# Patient Record
Sex: Female | Born: 1970 | Race: White | Hispanic: No | Smoking: Former smoker
Health system: Southern US, Community
[De-identification: ages and names within clinical notes are randomized; demographics above are authoritative.]

## PROBLEM LIST (undated history)

## (undated) DIAGNOSIS — F419 Anxiety disorder, unspecified: Secondary | ICD-10-CM

## (undated) DIAGNOSIS — R768 Other specified abnormal immunological findings in serum: Secondary | ICD-10-CM

## (undated) DIAGNOSIS — R7689 Other specified abnormal immunological findings in serum: Secondary | ICD-10-CM

## (undated) HISTORY — DX: Other specified abnormal immunological findings in serum: R76.89

## (undated) HISTORY — DX: Anxiety disorder, unspecified: F41.9

## (undated) HISTORY — PX: SHOULDER SURGERY: SHX246

## (undated) HISTORY — DX: Other specified abnormal immunological findings in serum: R76.8

## (undated) HISTORY — PX: NECK SURGERY: SHX720

---

## 2000-11-30 ENCOUNTER — Emergency Department (HOSPITAL_COMMUNITY): Admission: EM | Admit: 2000-11-30 | Discharge: 2000-11-30 | Payer: Self-pay

## 2018-07-19 ENCOUNTER — Emergency Department (HOSPITAL_COMMUNITY)
Admission: EM | Admit: 2018-07-19 | Discharge: 2018-07-19 | Disposition: A | Discharge status: Home / Self Care | Payer: Self-pay | Attending: Emergency Medicine | Admitting: Emergency Medicine

## 2018-07-19 ENCOUNTER — Encounter (HOSPITAL_COMMUNITY): Payer: Self-pay | Admitting: Emergency Medicine

## 2018-07-19 ENCOUNTER — Emergency Department (HOSPITAL_COMMUNITY): Payer: Self-pay

## 2018-07-19 DIAGNOSIS — F17210 Nicotine dependence, cigarettes, uncomplicated: Secondary | ICD-10-CM | POA: Insufficient documentation

## 2018-07-19 DIAGNOSIS — R1011 Right upper quadrant pain: Secondary | ICD-10-CM | POA: Insufficient documentation

## 2018-07-19 LAB — COMPREHENSIVE METABOLIC PANEL
ALT: 42 U/L (ref 0–44)
AST: 48 U/L — AB (ref 15–41)
Albumin: 3.9 g/dL (ref 3.5–5.0)
Alkaline Phosphatase: 44 U/L (ref 38–126)
Anion gap: 5 (ref 5–15)
BUN: 9 mg/dL (ref 6–20)
CHLORIDE: 105 mmol/L (ref 98–111)
CO2: 28 mmol/L (ref 22–32)
CREATININE: 0.74 mg/dL (ref 0.44–1.00)
Calcium: 9.4 mg/dL (ref 8.9–10.3)
GFR calc non Af Amer: >60 mL/min (ref >60)
Glucose, Bld: 101 mg/dL — ABNORMAL HIGH (ref 70–99)
POTASSIUM: 4 mmol/L (ref 3.5–5.1)
SODIUM: 138 mmol/L (ref 135–145)
Total Bilirubin: 0.4 mg/dL (ref 0.3–1.2)
Total Protein: 7.5 g/dL (ref 6.5–8.1)

## 2018-07-19 LAB — CBC
HEMATOCRIT: 43 % (ref 36.0–46.0)
HEMOGLOBIN: 14 g/dL (ref 12.0–15.0)
MCH: 33.1 pg (ref 26.0–34.0)
MCHC: 32.6 g/dL (ref 30.0–36.0)
MCV: 101.7 fL — AB (ref 80.0–100.0)
Platelets: 274 10*3/uL (ref 150–400)
RBC: 4.23 MIL/uL (ref 3.87–5.11)
RDW: 11.6 % (ref 11.5–15.5)
WBC: 6.4 10*3/uL (ref 4.0–10.5)
nRBC: 0 % (ref 0.0–0.2)

## 2018-07-19 LAB — LIPASE, BLOOD: LIPASE: 64 U/L — AB (ref 11–51)

## 2018-07-19 LAB — URINALYSIS, ROUTINE W REFLEX MICROSCOPIC
Bilirubin Urine: NEGATIVE
GLUCOSE, UA: NEGATIVE mg/dL
Hgb urine dipstick: NEGATIVE
Ketones, ur: NEGATIVE mg/dL
Leukocytes, UA: NEGATIVE
NITRITE: NEGATIVE
PH: 7 (ref 5.0–8.0)
Protein, ur: NEGATIVE mg/dL
SPECIFIC GRAVITY, URINE: 1.005 (ref 1.005–1.030)

## 2018-07-19 LAB — I-STAT BETA HCG BLOOD, ED (MC, WL, AP ONLY): I-stat hCG, quantitative: <5 m[IU]/mL (ref <5)

## 2018-07-19 MED ORDER — ONDANSETRON HCL 4 MG/2ML IJ SOLN
4.0000 mg | Freq: Once | INTRAMUSCULAR | Status: AC
  Administered 2018-07-19: 4 mg via INTRAVENOUS
  Filled 2018-07-19: qty 2

## 2018-07-19 MED ORDER — HYDROMORPHONE HCL 1 MG/ML IJ SOLN
1.0000 mg | Freq: Once | INTRAMUSCULAR | Status: AC
  Administered 2018-07-19: 1 mg via INTRAVENOUS
  Filled 2018-07-19: qty 1

## 2018-07-19 MED ORDER — SODIUM CHLORIDE 0.9 % IV BOLUS
1000.0000 mL | Freq: Once | INTRAVENOUS | Status: AC
  Administered 2018-07-19: 1000 mL via INTRAVENOUS

## 2018-07-19 MED ORDER — IOHEXOL 300 MG/ML  SOLN
100.0000 mL | Freq: Once | INTRAMUSCULAR | Status: AC | PRN
  Administered 2018-07-19: 100 mL via INTRAVENOUS

## 2018-07-19 NOTE — ED Triage Notes (Signed)
Pt to ER for evaluation of RUQ abdominal pain, tender to palpation, denies nausea but reports when it started she broke out in a sweat. Pt in NAD at this time. Reports significant stress over the last week.

## 2018-07-19 NOTE — ED Notes (Signed)
Results reviewed, no changes in acuity at this time 

## 2018-07-19 NOTE — ED Notes (Signed)
Patient transported to Ultrasound 

## 2018-07-19 NOTE — ED Provider Notes (Signed)
[facility] EMERGENCY DEPARTMENT Provider Note   CSN: [id number] Arrival date & time: 07/19/18  1432     History   Chief Complaint Chief Complaint  Patient presents with  . Abdominal Pain    HPI Kaitlin Carpenter is a 47 y.o. female.  47 y/o female with no PMH presents to the ED with a chief complaint of abdominal pain since this morning. Patient reports a sudden onset of stabbing constant RUQ pain with radiation to her back. She reports she has a previous history of 3 broken ribs on the left side from 20 years ago. Patient also reports a fever but denies taking her temperature, she also reports to be slightly nauseated.In addition patient reports she's had diarrhea for the past 3 days, reporting her stool as soft and in liquid nature with no blood. She denies any shortness breath, urinary symptoms or other complaints.      History reviewed. No pertinent past medical history.  There are no active problems to display for this patient.   History reviewed. No pertinent surgical history.   OB History   None      Home Medications    Prior to Admission medications   Not on File    Family History History reviewed. No pertinent family history.  Social History Social History   Tobacco Use  . Smoking status: Current Every Day Smoker  . Smokeless tobacco: Never Used  Substance Use Topics  . Alcohol use: Not on file  . Drug use: Not on file     Allergies   Patient has no allergy information on record.   Review of Systems Review of Systems  Constitutional: Positive for fever. Negative for chills.  HENT: Negative for sore throat.   Respiratory: Negative for shortness of breath.   Cardiovascular: Negative for chest pain.  Gastrointestinal: Positive for abdominal pain, diarrhea and nausea. Negative for vomiting.  Genitourinary: Negative for dysuria and flank pain.  Musculoskeletal: Negative for back pain.  Skin: Negative for pallor and wound.    Neurological: Negative for light-headedness and headaches.     Physical Exam Updated Vital Signs BP (!) 155/92   Pulse 74   Temp 98.2 F (36.8 C) (Oral)   Resp 15   SpO2 100%   Physical Exam  Constitutional: She is oriented to person, place, and time. She appears well-developed and well-nourished.  HENT:  Head: Normocephalic and atraumatic.  Cardiovascular: Normal rate.  Pulmonary/Chest: Effort normal and breath sounds normal.  Abdominal: Soft. Bowel sounds are normal. There is tenderness in the right upper quadrant. There is positive Murphy's sign. There is no rigidity, no CVA tenderness and no tenderness at McBurney's point.  Neurological: She is alert and oriented to person, place, and time.  Skin: Skin is warm and dry.  Nursing note and vitals reviewed.    ED Treatments / Results  Labs (all labs ordered are listed, but only abnormal results are displayed) Labs Reviewed  LIPASE, BLOOD - Abnormal; Notable for the following components:      Result Value   Lipase 64 (*)    All other components within normal limits  COMPREHENSIVE METABOLIC PANEL - Abnormal; Notable for the following components:   Glucose, Bld 101 (*)    AST 48 (*)    All other components within normal limits  CBC - Abnormal; Notable for the following components:   MCV 101.7 (*)    All other components within normal limits  URINALYSIS, ROUTINE W REFLEX MICROSCOPIC  I-STAT BETA HCG BLOOD, ED ([facility], [facility], [facility] ONLY)    EKG EKG Interpretation  Date/Time:  Saturday July 19 2018 14:48:37 EDT Ventricular Rate:  74 PR Interval:  134 QRS Duration: 84 QT Interval:  382 QTC Calculation: 424 R Axis:   82 Text Interpretation:  Normal sinus rhythm Normal ECG No previous tracing Confirmed by Gwyneth Sprout ([id number]) on 07/19/2018 6:59:52 PM   Radiology Ct Abdomen Pelvis W Contrast  Result Date: 07/19/2018 CLINICAL DATA:  Right upper quadrant abdominal pain and tenderness to palpation. EXAM: CT ABDOMEN  AND PELVIS WITH CONTRAST TECHNIQUE: Multidetector CT imaging of the abdomen and pelvis was performed using the standard protocol following bolus administration of intravenous contrast. CONTRAST:  OMNIPAQUE IOHEXOL 300 MG/ML  SOLN COMPARISON:  Same day right upper quadrant ultrasound. FINDINGS: Lower chest: Normal heart size.  Dependent bibasilar atelectasis. Hepatobiliary: No focal liver abnormality is seen. No gallstones, gallbladder wall thickening, or biliary dilatation. Pancreas: Unremarkable. No pancreatic ductal dilatation or surrounding inflammatory changes. Spleen: Normal in size without focal abnormality. Adrenals/Urinary Tract: Normal bilateral adrenal glands and kidneys. No nephrolithiasis, renal mass nor hydroureteronephrosis. Stomach/Bowel: Decompressed stomach. Normal duodenal sweep and ligament of Treitz position. Normal small bowel rotation. No bowel obstruction inflammation. The distal and terminal ileum are normal. The appendix is unremarkable and within normal limits for caliber. Moderate stool retention is seen within the colon without large bowel obstruction or inflammation. Vascular/Lymphatic: No significant vascular findings are present. No enlarged abdominal or pelvic lymph nodes. Reproductive: Tubular 5.3 x 1.8 cm fluid-filled structure in the right adnexa suspicious for hydrosalpinx, series 3/65. Physiologic sized follicles are otherwise noted within both ovaries. The uterus is anteverted in appearance without apparent mass. Other: No free air nor free fluid. Musculoskeletal: Degenerative disc disease L5-S1. No aggressive osseous lesions. IMPRESSION: 1. Tubular fluid-filled right adnexal structure suspicious for hydrosalpinx measuring up to 5.3 cm length. 2. Unremarkable appearance of the gallbladder without biliary dilatation, stones nor secondary signs of acute cholecystitis. 3. Moderate stool retention within the colon without bowel obstruction or inflammation. Electronically  Signed   By: Tollie Eth M.D.   On: 07/19/2018 19:52   US Abdomen Limited Ruq  Result Date: 07/19/2018 CLINICAL DATA:  Right upper quadrant pain EXAM: ULTRASOUND ABDOMEN LIMITED RIGHT UPPER QUADRANT COMPARISON:  None. FINDINGS: Gallbladder: No gallstones or wall thickening visualized. No sonographic Murphy sign noted by sonographer. Common bile duct: Diameter: Normal caliber, 3 mm Liver: No focal lesion identified. Within normal limits in parenchymal echogenicity. Portal vein is patent on color Doppler imaging with normal direction of blood flow towards the liver. IMPRESSION: Normal right upper quadrant ultrasound. Electronically Signed   By: Charlett Nose M.D.   On: 07/19/2018 18:06    Procedures Procedures (including critical care time)  Medications Ordered in ED Medications  sodium chloride 0.9 % bolus 1,000 mL (1,000 mLs Intravenous New Bag/Given 07/19/18 1818)  ondansetron (ZOFRAN) injection 4 mg (4 mg Intravenous Given 07/19/18 1818)  HYDROmorphone (DILAUDID) injection 1 mg (1 mg Intravenous Given 07/19/18 1819)  iohexol (OMNIPAQUE) 300 MG/ML solution 100 mL (100 mLs Intravenous Contrast Given 07/19/18 1924)     Initial Impression / Assessment and Plan / ED Course  I have reviewed the triage vital signs and the nursing notes.  Pertinent labs & imaging results that were available during my care of the patient were reviewed by me and considered in my medical decision making (see chart for details).    Presents with abdominal pain along the right upper  quadrant, it was of sudden onset this morning.  MP showed slight elevation of her AST at 48, lipase was elevated at 64.  A right upper quadrant ultrasound was obtained to rule out any stone pathology, US Abdomen showed no pathology.  Due to patient's pain obtain a CT abdomen and pelvis to evaluate any pathology in her gallbladder, liver, kidneys.  CT abdomen and pelvis was negative for any acute abnormality it showed: 1. Tubular  fluid-filled right adnexal structure suspicious for hydrosalpinx measuring up to 5.3 cm length. 2. Unremarkable appearance of the gallbladder without biliary dilatation, stones nor secondary signs of acute cholecystitis. 3. Moderate stool retention within the colon without bowel obstruction or inflammation.  Patient has received a bolus of fluid in the ED along with Dilaudid 1 mg for her pain.  Patient reports the pain has improved.  Patient also admitted to 1 of our RNs during her visit that she has been on a 6-day drinking binge as she is currently separating from her husband along with relocating.  Patient is originally from [place] and has not established care in [place].  At this time I have advised patient that she needs to establish care with a primary care physician to further evaluate her GI issues, there are no signs of infection, patient has been afebrile during ED visit.  Patient stable for discharge, vitals stable for discharge.  Return precautions provided.  Final Clinical Impressions(s) / ED Diagnoses   Final diagnoses:  RUQ pain  Right upper quadrant abdominal pain    ED Discharge Orders    None       Claude Manges, Cordelia Poche 07/19/18 2053    Gwyneth Sprout, MD 07/22/18 [phone]

## 2018-07-19 NOTE — Discharge Instructions (Signed)
All imaging today was normal.I have provided a referral for a gastroenterologist, please call to schedule an appointment at your earliest convenience. You may alternate ibuprofen and tylenol for your pain. Please return to the ED if you experience any fever, worsening symptoms, chest pain or shortness of breath.

## 2018-07-20 ENCOUNTER — Encounter (HOSPITAL_COMMUNITY): Payer: Self-pay

## 2018-07-20 ENCOUNTER — Emergency Department (HOSPITAL_COMMUNITY)
Admission: EM | Admit: 2018-07-20 | Discharge: 2018-07-20 | Disposition: A | Discharge status: Home / Self Care | Payer: Self-pay | Attending: Emergency Medicine | Admitting: Emergency Medicine

## 2018-07-20 ENCOUNTER — Other Ambulatory Visit: Payer: Self-pay

## 2018-07-20 DIAGNOSIS — F17210 Nicotine dependence, cigarettes, uncomplicated: Secondary | ICD-10-CM | POA: Insufficient documentation

## 2018-07-20 DIAGNOSIS — K279 Peptic ulcer, site unspecified, unspecified as acute or chronic, without hemorrhage or perforation: Secondary | ICD-10-CM

## 2018-07-20 DIAGNOSIS — K2900 Acute gastritis without bleeding: Secondary | ICD-10-CM

## 2018-07-20 LAB — COMPREHENSIVE METABOLIC PANEL
ALBUMIN: 3.7 g/dL (ref 3.5–5.0)
ALK PHOS: 38 U/L (ref 38–126)
ALT: 39 U/L (ref 0–44)
ANION GAP: 9 (ref 5–15)
AST: 47 U/L — ABNORMAL HIGH (ref 15–41)
BILIRUBIN TOTAL: 0.7 mg/dL (ref 0.3–1.2)
BUN: 10 mg/dL (ref 6–20)
CALCIUM: 9.1 mg/dL (ref 8.9–10.3)
CO2: 24 mmol/L (ref 22–32)
Chloride: 104 mmol/L (ref 98–111)
Creatinine, Ser: 0.66 mg/dL (ref 0.44–1.00)
GFR calc Af Amer: >60 mL/min (ref >60)
Glucose, Bld: 107 mg/dL — ABNORMAL HIGH (ref 70–99)
Potassium: 3.6 mmol/L (ref 3.5–5.1)
Sodium: 137 mmol/L (ref 135–145)
Total Protein: 7.1 g/dL (ref 6.5–8.1)

## 2018-07-20 LAB — CBC WITH DIFFERENTIAL/PLATELET
Abs Immature Granulocytes: 0.02 10*3/uL (ref 0.00–0.07)
BASOS ABS: 0 10*3/uL (ref 0.0–0.1)
Basophils Relative: 0 %
EOS PCT: 2 %
Eosinophils Absolute: 0.2 10*3/uL (ref 0.0–0.5)
HEMATOCRIT: 37.9 % (ref 36.0–46.0)
Hemoglobin: 12.8 g/dL (ref 12.0–15.0)
Immature Granulocytes: 0 %
LYMPHS ABS: 1.1 10*3/uL (ref 0.7–4.0)
Lymphocytes Relative: 13 %
MCH: 34 pg (ref 26.0–34.0)
MCHC: 33.8 g/dL (ref 30.0–36.0)
MCV: 100.5 fL — AB (ref 80.0–100.0)
MONO ABS: 0.5 10*3/uL (ref 0.1–1.0)
MONOS PCT: 6 %
Neutro Abs: 6.2 10*3/uL (ref 1.7–7.7)
Neutrophils Relative %: 79 %
Platelets: 235 10*3/uL (ref 150–400)
RBC: 3.77 MIL/uL — ABNORMAL LOW (ref 3.87–5.11)
RDW: 11.8 % (ref 11.5–15.5)
WBC: 8 10*3/uL (ref 4.0–10.5)
nRBC: 0 % (ref 0.0–0.2)

## 2018-07-20 LAB — LIPASE, BLOOD: LIPASE: 33 U/L (ref 11–51)

## 2018-07-20 MED ORDER — ONDANSETRON HCL 4 MG/2ML IJ SOLN
4.0000 mg | Freq: Once | INTRAMUSCULAR | Status: AC
  Administered 2018-07-20: 4 mg via INTRAVENOUS
  Filled 2018-07-20: qty 2

## 2018-07-20 MED ORDER — OMEPRAZOLE 20 MG PO CPDR: 20.0000 mg | DELAYED_RELEASE_CAPSULE | Freq: Every day | ORAL | 0 refills | Status: DC

## 2018-07-20 MED ORDER — SODIUM CHLORIDE 0.9 % IV BOLUS
1000.0000 mL | Freq: Once | INTRAVENOUS | Status: AC
  Administered 2018-07-20: 1000 mL via INTRAVENOUS

## 2018-07-20 MED ORDER — ONDANSETRON 4 MG PO TBDP: 4.0000 mg | ORAL_TABLET | Freq: Three times a day (TID) | ORAL | 0 refills | Status: DC | PRN

## 2018-07-20 MED ORDER — SUCRALFATE 1 G PO TABS: 1.0000 g | ORAL_TABLET | Freq: Three times a day (TID) | ORAL | 0 refills | Status: DC

## 2018-07-20 MED ORDER — MORPHINE SULFATE (PF) 4 MG/ML IV SOLN
5.0000 mg | Freq: Once | INTRAVENOUS | Status: AC
  Administered 2018-07-20: 5 mg via INTRAVENOUS
  Filled 2018-07-20: qty 2

## 2018-07-20 MED ORDER — HYDROCODONE-ACETAMINOPHEN 5-325 MG PO TABS: 1.0000 | ORAL_TABLET | Freq: Four times a day (QID) | ORAL | 0 refills | Status: DC | PRN

## 2018-07-20 MED ORDER — FAMOTIDINE IN NACL 20-0.9 MG/50ML-% IV SOLN
20.0000 mg | Freq: Once | INTRAVENOUS | Status: AC
  Administered 2018-07-20: 20 mg via INTRAVENOUS
  Filled 2018-07-20: qty 50

## 2018-07-20 MED ORDER — GI COCKTAIL ~~LOC~~
30.0000 mL | Freq: Once | ORAL | Status: AC
  Administered 2018-07-20: 30 mL via ORAL
  Filled 2018-07-20: qty 30

## 2018-07-20 MED ORDER — KETOROLAC TROMETHAMINE 15 MG/ML IJ SOLN: 15.0000 mg | Freq: Once | INTRAMUSCULAR | Status: DC

## 2018-07-20 NOTE — ED Notes (Signed)
ED Provider at bedside. 

## 2018-07-20 NOTE — ED Notes (Signed)
Patient has been instructed and advised to limit or completley avoid consuming ETOH and to temporarily stop taking Ibprofuen, and if patient needs to take Ibprofuen that patient needs to consume food with medication.  Shortly after patient received medication, the patient had made several rude comments over phone about RN and needing to be discharged.

## 2018-07-20 NOTE — Discharge Instructions (Addendum)
STOP taking Ibuprofen, Aspirin, Alleve, or other NSAID medications  START taking the medications as prescribed. Take the Omeprazole first thing in the morning, 30 minutes before eating.  Eat frequent, small meals  Decrease your alcohol intake  Avoid spicy foods

## 2018-07-20 NOTE — ED Triage Notes (Signed)
Pt comes from home. Pt is Ambulatory and AOx4. Pt brought in via [facility]. Pt called 911 due to abdominal pain in upper right quadrant that was sharp and sudden at 1000 that began yesterday, was seen at [facility]. Pt arrives today with same complaint with pain that began again yesterday at 1700 and patient stated she passed out.   Patient received of Fentanyl IV in route to [facility].

## 2018-07-20 NOTE — ED Provider Notes (Signed)
[facility]-EMERGENCY DEPT Provider Note   CSN: [id number] Arrival date & time: 07/20/18  0732     History   Chief Complaint Chief Complaint  Patient presents with  . Abdominal Pain    HPI Kaitlin Carpenter is a 47 y.o. female.  HPI 47 year old female here with epigastric and right upper quadrant pain.  The patient was just seen yesterday for similar complaints.  She states that over the last several days, she has had an aching, gnawing, superior, epigastric and right upper quadrant pain.  She admits to recent increase in alcohol use as well as ibuprofen and NSAID use due to stressors in her life.  She was seen in the ED yesterday and had negative CT scan.  She had an incidental hydrosalpinx but states this is been noted previously and she has no vaginal bleeding or discharge.  The pain is localized primarily in the right upper quadrant and epigastric area.  It seems to be worse in the mornings.  It has some occasional relief with eating, though it worsens when eating spicy foods.  Denies any alleviating factors.  She is not on any antacids.  She continues to drink regularly.  She is had poor appetite and some night sweats recently.  Denies known history of ulcers.  No blood in her stools, though she intermittently notes dark stools.  Denies any blood in her emesis.  The emesis is light green/yellow with no dark green or bile.  History reviewed. No pertinent past medical history.  There are no active problems to display for this patient.   History reviewed. No pertinent surgical history.   OB History   None      Home Medications    Prior to Admission medications   Medication Sig Start Date End Date Taking? Authorizing Provider  HYDROcodone-acetaminophen (NORCO/VICODIN) 5-325 MG tablet Take 1 tablet by mouth every 6 (six) hours as needed for severe pain. 07/20/18   Shaune Pollack, MD  omeprazole (PRILOSEC) 20 MG capsule Take 1 capsule (20 mg total) by mouth  daily for 14 days. 07/20/18 08/03/18  Shaune Pollack, MD  ondansetron (ZOFRAN ODT) 4 MG disintegrating tablet Take 1 tablet (4 mg total) by mouth every 8 (eight) hours as needed for nausea or vomiting. 07/20/18   Shaune Pollack, MD  sucralfate (CARAFATE) 1 g tablet Take 1 tablet (1 g total) by mouth 4 (four) times daily -  with meals and at bedtime for 10 days. 07/20/18 07/30/18  Shaune Pollack, MD    Family History No family history on file.  Social History Social History   Tobacco Use  . Smoking status: Current Every Day Smoker    Packs/day: 1.00    Types: Cigarettes  . Smokeless tobacco: Never Used  Substance Use Topics  . Alcohol use: Yes    Alcohol/week: 6.0 standard drinks    Types: 6 Cans of beer per week    Comment: Pt drinks 6 pack roughly during work week and drinks more on weekend  . Drug use: Never     Allergies   Patient has no known allergies.   Review of Systems Review of Systems  Constitutional: Positive for fatigue. Negative for chills and fever.  HENT: Negative for congestion and rhinorrhea.   Eyes: Negative for visual disturbance.  Respiratory: Negative for cough, shortness of breath and wheezing.   Cardiovascular: Negative for chest pain and leg swelling.  Gastrointestinal: Positive for abdominal pain, nausea and vomiting. Negative for diarrhea.  Genitourinary: Negative for  dysuria and flank pain.  Musculoskeletal: Negative for neck pain and neck stiffness.  Skin: Negative for rash and wound.  Allergic/Immunologic: Negative for immunocompromised state.  Neurological: Negative for syncope, weakness and headaches.  All other systems reviewed and are negative.    Physical Exam Updated Vital Signs BP (!) 142/88   Pulse 80   Temp 97.6 F (36.4 C) (Oral)   Resp 10   Ht 5\' 4"  (1.626 m)   Wt 55.3 kg   LMP 06/27/2018 (Within Days)   SpO2 99%   BMI 20.94 kg/m   Physical Exam  Constitutional: She is oriented to person, place, and time. She  appears well-developed and well-nourished. No distress.  HENT:  Head: Normocephalic and atraumatic.  Eyes: Conjunctivae are normal.  Neck: Neck supple.  Cardiovascular: Normal rate, regular rhythm and normal heart sounds. Exam reveals no friction rub.  No murmur heard. Pulmonary/Chest: Effort normal and breath sounds normal. No respiratory distress. She has no wheezes. She has no rales.  Abdominal: She exhibits no distension. There is tenderness in the right upper quadrant and epigastric area. There is no rigidity, no rebound and no guarding.  Musculoskeletal: She exhibits no edema.  Neurological: She is alert and oriented to person, place, and time. She exhibits normal muscle tone.  Skin: Skin is warm. Capillary refill takes less than 2 seconds.  Psychiatric: She has a normal mood and affect.  Nursing note and vitals reviewed.    ED Treatments / Results  Labs (all labs ordered are listed, but only abnormal results are displayed) Labs Reviewed  CBC WITH DIFFERENTIAL/PLATELET - Abnormal; Notable for the following components:      Result Value   RBC 3.77 (*)    MCV 100.5 (*)    All other components within normal limits  COMPREHENSIVE METABOLIC PANEL - Abnormal; Notable for the following components:   Glucose, Bld 107 (*)    AST 47 (*)    All other components within normal limits  LIPASE, BLOOD    EKG EKG Interpretation  Date/Time:  Sunday July 20 2018 08:50:22 EDT Ventricular Rate:  81 PR Interval:    QRS Duration: 89 QT Interval:  400 QTC Calculation: 465 R Axis:   79 Text Interpretation:  Sinus rhythm No significant change since last tracing Confirmed by Shaune Pollack [phone]) on 07/20/2018 9:05:20 AM   Radiology Ct Abdomen Pelvis W Contrast  Result Date: 07/19/2018 CLINICAL DATA:  Right upper quadrant abdominal pain and tenderness to palpation. EXAM: CT ABDOMEN AND PELVIS WITH CONTRAST TECHNIQUE: Multidetector CT imaging of the abdomen and pelvis was performed  using the standard protocol following bolus administration of intravenous contrast. CONTRAST:  OMNIPAQUE IOHEXOL 300 MG/ML  SOLN COMPARISON:  Same day right upper quadrant ultrasound. FINDINGS: Lower chest: Normal heart size.  Dependent bibasilar atelectasis. Hepatobiliary: No focal liver abnormality is seen. No gallstones, gallbladder wall thickening, or biliary dilatation. Pancreas: Unremarkable. No pancreatic ductal dilatation or surrounding inflammatory changes. Spleen: Normal in size without focal abnormality. Adrenals/Urinary Tract: Normal bilateral adrenal glands and kidneys. No nephrolithiasis, renal mass nor hydroureteronephrosis. Stomach/Bowel: Decompressed stomach. Normal duodenal sweep and ligament of Treitz position. Normal small bowel rotation. No bowel obstruction inflammation. The distal and terminal ileum are normal. The appendix is unremarkable and within normal limits for caliber. Moderate stool retention is seen within the colon without large bowel obstruction or inflammation. Vascular/Lymphatic: No significant vascular findings are present. No enlarged abdominal or pelvic lymph nodes. Reproductive: Tubular 5.3 x 1.8 cm fluid-filled structure in  the right adnexa suspicious for hydrosalpinx, series 3/65. Physiologic sized follicles are otherwise noted within both ovaries. The uterus is anteverted in appearance without apparent mass. Other: No free air nor free fluid. Musculoskeletal: Degenerative disc disease L5-S1. No aggressive osseous lesions. IMPRESSION: 1. Tubular fluid-filled right adnexal structure suspicious for hydrosalpinx measuring up to 5.3 cm length. 2. Unremarkable appearance of the gallbladder without biliary dilatation, stones nor secondary signs of acute cholecystitis. 3. Moderate stool retention within the colon without bowel obstruction or inflammation. Electronically Signed   By: Tollie Eth M.D.   On: 07/19/2018 19:52   US Abdomen Limited Ruq  Result Date:  07/19/2018 CLINICAL DATA:  Right upper quadrant pain EXAM: ULTRASOUND ABDOMEN LIMITED RIGHT UPPER QUADRANT COMPARISON:  None. FINDINGS: Gallbladder: No gallstones or wall thickening visualized. No sonographic Murphy sign noted by sonographer. Common bile duct: Diameter: Normal caliber, 3 mm Liver: No focal lesion identified. Within normal limits in parenchymal echogenicity. Portal vein is patent on color Doppler imaging with normal direction of blood flow towards the liver. IMPRESSION: Normal right upper quadrant ultrasound. Electronically Signed   By: Charlett Nose M.D.   On: 07/19/2018 18:06    Procedures Procedures (including critical care time)  Medications Ordered in ED Medications  sodium chloride 0.9 % bolus 1,000 mL (0 mLs Intravenous Stopped 07/20/18 0923)  gi cocktail (Maalox,Lidocaine,Donnatal) (30 mLs Oral Given 07/20/18 0817)  famotidine (PEPCID) IVPB 20 mg premix (0 mg Intravenous Stopped 07/20/18 0923)  ondansetron (ZOFRAN) injection 4 mg (4 mg Intravenous Given 07/20/18 0818)  morphine 4 MG/ML injection 5 mg (5 mg Intravenous Given 07/20/18 0929)     Initial Impression / Assessment and Plan / ED Course  I have reviewed the triage vital signs and the nursing notes.  Pertinent labs & imaging results that were available during my care of the patient were reviewed by me and considered in my medical decision making (see chart for details).     47 yo F here with RUQ/epigastric pain. Records reviewed from recent ED visit - labs reassuring, lipase/AST minimally elevated, CT neg for acute abnormality. History, exam is most c/w likely PUD/gastritis exacerbated by regular EtOH use, NSAIDs, and increased stressors. She is HDS, no blood in emesis though she does note some intermittent dark stool - will check CBC, labs, tx as such. No gallstones on recent RUQ U/S. No cough, referred pain from pulm source unlikely. She does have incidental hydrosalpinx but this has been noted in past per pt,  no urinary/vaginal sx, doubt pain from GU source.  Sx markedly improved with GI cocktail. Tolerating PO without difficulty. Hgb stable (slight decrease likely 2/2 fluids given yesterday). No BRBPR or hematemesis. Lipase, AST at baseline. Suspect ongoing gastritis/GERD. Will start on antacids, given analgesics other than NSAIDs and refer to GI. Return precautions given.  Final Clinical Impressions(s) / ED Diagnoses   Final diagnoses:  Acute superficial gastritis without hemorrhage  PUD (peptic ulcer disease)    ED Discharge Orders         Ordered    omeprazole (PRILOSEC) 20 MG capsule  Daily     07/20/18 0953    sucralfate (CARAFATE) 1 g tablet  3 times daily with meals & bedtime     07/20/18 0953    HYDROcodone-acetaminophen (NORCO/VICODIN) 5-325 MG tablet  Every 6 hours PRN     07/20/18 0953    ondansetron (ZOFRAN ODT) 4 MG disintegrating tablet  Every 8 hours PRN     07/20/18 0954  Shaune Pollack, MD 07/20/18 [phone]

## 2018-10-02 ENCOUNTER — Ambulatory Visit: Payer: Self-pay

## 2019-05-20 ENCOUNTER — Emergency Department (HOSPITAL_COMMUNITY)
Admission: EM | Admit: 2019-05-20 | Discharge: 2019-05-20 | Disposition: A | Discharge status: Home / Self Care | Payer: Self-pay | Attending: Emergency Medicine | Admitting: Emergency Medicine

## 2019-05-20 ENCOUNTER — Emergency Department (HOSPITAL_COMMUNITY): Payer: Self-pay

## 2019-05-20 ENCOUNTER — Other Ambulatory Visit: Payer: Self-pay

## 2019-05-20 ENCOUNTER — Encounter (HOSPITAL_COMMUNITY): Payer: Self-pay | Admitting: Emergency Medicine

## 2019-05-20 DIAGNOSIS — F410 Panic disorder [episodic paroxysmal anxiety] without agoraphobia: Secondary | ICD-10-CM | POA: Insufficient documentation

## 2019-05-20 DIAGNOSIS — R05 Cough: Secondary | ICD-10-CM | POA: Insufficient documentation

## 2019-05-20 DIAGNOSIS — F17210 Nicotine dependence, cigarettes, uncomplicated: Secondary | ICD-10-CM | POA: Insufficient documentation

## 2019-05-20 DIAGNOSIS — R197 Diarrhea, unspecified: Secondary | ICD-10-CM | POA: Insufficient documentation

## 2019-05-20 LAB — CBC WITH DIFFERENTIAL/PLATELET
Abs Immature Granulocytes: 0 10*3/uL (ref 0.00–0.07)
Basophils Absolute: 0 10*3/uL (ref 0.0–0.1)
Basophils Relative: 1 %
Eosinophils Absolute: 0 10*3/uL (ref 0.0–0.5)
Eosinophils Relative: 1 %
HCT: 38.1 % (ref 36.0–46.0)
Hemoglobin: 13.5 g/dL (ref 12.0–15.0)
Immature Granulocytes: 0 %
Lymphocytes Relative: 26 %
Lymphs Abs: 1 10*3/uL (ref 0.7–4.0)
MCH: 36.1 pg — ABNORMAL HIGH (ref 26.0–34.0)
MCHC: 35.4 g/dL (ref 30.0–36.0)
MCV: 101.9 fL — ABNORMAL HIGH (ref 80.0–100.0)
Monocytes Absolute: 0.5 10*3/uL (ref 0.1–1.0)
Monocytes Relative: 14 %
Neutro Abs: 2.3 10*3/uL (ref 1.7–7.7)
Neutrophils Relative %: 58 %
Platelets: 152 10*3/uL (ref 150–400)
RBC: 3.74 MIL/uL — ABNORMAL LOW (ref 3.87–5.11)
RDW: 12.1 % (ref 11.5–15.5)
WBC: 3.9 10*3/uL — ABNORMAL LOW (ref 4.0–10.5)
nRBC: 0 % (ref 0.0–0.2)

## 2019-05-20 LAB — BASIC METABOLIC PANEL
Anion gap: 13 (ref 5–15)
BUN: 5 mg/dL — ABNORMAL LOW (ref 6–20)
CO2: 24 mmol/L (ref 22–32)
Calcium: 8.9 mg/dL (ref 8.9–10.3)
Chloride: 99 mmol/L (ref 98–111)
Creatinine, Ser: 0.7 mg/dL (ref 0.44–1.00)
GFR calc Af Amer: >60 mL/min (ref >60)
GFR calc non Af Amer: >60 mL/min (ref >60)
Glucose, Bld: 105 mg/dL — ABNORMAL HIGH (ref 70–99)
Potassium: 3.5 mmol/L (ref 3.5–5.1)
Sodium: 136 mmol/L (ref 135–145)

## 2019-05-20 LAB — TSH: TSH: 1.701 u[IU]/mL (ref 0.350–4.500)

## 2019-05-20 LAB — TROPONIN I (HIGH SENSITIVITY): Troponin I (High Sensitivity): 15 ng/L (ref <18)

## 2019-05-20 MED ORDER — LORAZEPAM 2 MG/ML IJ SOLN
2.0000 mg | Freq: Once | INTRAMUSCULAR | Status: AC
  Administered 2019-05-20: 15:00:00 2 mg via INTRAVENOUS
  Filled 2019-05-20: qty 1

## 2019-05-20 MED ORDER — HYDROXYZINE HCL 25 MG PO TABS: 25.0000 mg | ORAL_TABLET | Freq: Four times a day (QID) | ORAL | 0 refills | Status: DC | PRN

## 2019-05-20 NOTE — ED Triage Notes (Signed)
Pt arrives via EMS from home with reports of dizziness and vision trouble. Hx of anxiety but not on medications. Pt endorses stress.

## 2019-05-20 NOTE — ED Notes (Signed)
Pt verbalized understanding of discharge paperwork, prescriptions and follow-up care 

## 2019-05-20 NOTE — ED Provider Notes (Signed)
[facility] EMERGENCY DEPARTMENT Provider Note   CSN: [id number] Arrival date & time: 05/20/19  1442    History   Chief Complaint Chief Complaint  Patient presents with  . Panic Attack    HPI Kaitlin Carpenter is a 48 y.o. female history of anxiety presents to the ED complaining of anxiety, palpitations, chest tightness, dizziness, and blurred vision.  Patient reports her symptoms acutely worsened this afternoon and she became very dizzy, described as the room spinning, and states she felt like she was in a pass out so she called EMS.  She reports she has been having palpitations and chest tightness intermittently over several months when she feels anxious.  Patient reports that the tightness is located in the center of her chest.  She also endorses diaphoresis and tremors during these times.  She is also reporting some transient blurred vision earlier today when she was feeling dizzy and states she is supposed to wear glasses.  Patient does endorse a cough since yesterday and a couple episodes of diarrhea earlier today.  Patient reports she smokes 1 pack of cigarettes daily and drinks 4-5 beers daily but denies any illicit drug use.  Patient reports she is going through several life stressors including being laid off and facing possible eviction from her apartment and states she has been very anxious recently.  She denies any recent fever, shortness of breath, abdominal pain, nausea, vomiting, or any other complaints.     The history is provided by the patient.    History reviewed. No pertinent past medical history.  There are no active problems to display for this patient.   History reviewed. No pertinent surgical history.   OB History   No obstetric history on file.      Home Medications    Prior to Admission medications   Medication Sig Start Date End Date Taking? Authorizing Provider  Aspirin-Salicylamide-Caffeine (BC HEADACHE POWDER PO) Take 1 packet by  mouth as needed (headaches).   Yes [provider]  HYDROcodone-acetaminophen (NORCO/VICODIN) 5-325 MG tablet Take 1 tablet by mouth every 6 (six) hours as needed for severe pain. Patient not taking: Reported on 05/20/2019 07/20/18   Duffy Bruce, MD  hydrOXYzine (ATARAX/VISTARIL) 25 MG tablet Take 1 tablet (25 mg total) by mouth every 6 (six) hours as needed for anxiety. 05/20/19   Candie Chroman, MD  omeprazole (PRILOSEC) 20 MG capsule Take 1 capsule (20 mg total) by mouth daily for 14 days. Patient not taking: Reported on 05/20/2019 07/20/18 08/03/18  Duffy Bruce, MD  ondansetron (ZOFRAN ODT) 4 MG disintegrating tablet Take 1 tablet (4 mg total) by mouth every 8 (eight) hours as needed for nausea or vomiting. Patient not taking: Reported on 05/20/2019 07/20/18   Duffy Bruce, MD  sucralfate (CARAFATE) 1 g tablet Take 1 tablet (1 g total) by mouth 4 (four) times daily -  with meals and at bedtime for 10 days. Patient not taking: Reported on 05/20/2019 07/20/18 07/30/18  Duffy Bruce, MD    Family History No family history on file.  Social History Social History   Tobacco Use  . Smoking status: Current Every Day Smoker    Packs/day: 1.00    Types: Cigarettes  . Smokeless tobacco: Never Used  Substance Use Topics  . Alcohol use: Yes    Alcohol/week: 6.0 standard drinks    Types: 6 Cans of beer per week    Comment: Pt drinks 6 pack roughly during work week and drinks more on  weekend  . Drug use: Never     Allergies   Patient has no known allergies.   Review of Systems Review of Systems  Constitutional: Positive for diaphoresis. Negative for chills and fever.  HENT: Negative for ear pain and sore throat.   Eyes: Positive for visual disturbance. Negative for pain.  Respiratory: Positive for cough. Negative for shortness of breath.   Cardiovascular: Positive for chest pain and palpitations.  Gastrointestinal: Positive for diarrhea. Negative for abdominal pain,  nausea and vomiting.  Genitourinary: Negative for dysuria and hematuria.  Musculoskeletal: Negative for arthralgias and back pain.  Skin: Negative for color change and rash.  Neurological: Positive for dizziness and tremors. Negative for seizures, syncope, facial asymmetry, speech difficulty, weakness, numbness and headaches.  Psychiatric/Behavioral: Negative for behavioral problems. The patient is nervous/anxious.   All other systems reviewed and are negative.    Physical Exam Updated Vital Signs BP 134/78   Pulse 81   Temp 99.4 F (37.4 C) (Oral)   Resp 19   SpO2 96%   Physical Exam Vitals signs and nursing note reviewed.  Constitutional:      General: She is not in acute distress.    Appearance: Normal appearance. She is well-developed. She is not ill-appearing, toxic-appearing or diaphoretic.  HENT:     Head: Normocephalic and atraumatic.     Nose: Nose normal. No congestion or rhinorrhea.     Mouth/Throat:     Mouth: Mucous membranes are moist.     Pharynx: Oropharynx is clear. No oropharyngeal exudate or posterior oropharyngeal erythema.  Eyes:     Extraocular Movements: Extraocular movements intact.     Conjunctiva/sclera: Conjunctivae normal.     Pupils: Pupils are equal, round, and reactive to light.  Neck:     Musculoskeletal: Normal range of motion and neck supple. No neck rigidity or muscular tenderness.  Cardiovascular:     Rate and Rhythm: Normal rate and regular rhythm.     Pulses: Normal pulses.     Heart sounds: Normal heart sounds. No murmur. No friction rub. No gallop.   Pulmonary:     Effort: Pulmonary effort is normal. No respiratory distress.     Breath sounds: Normal breath sounds. No stridor. No wheezing, rhonchi or rales.  Chest:     Chest wall: No tenderness.  Abdominal:     General: Abdomen is flat. There is no distension.     Palpations: Abdomen is soft.     Tenderness: There is no abdominal tenderness. There is no guarding or rebound.   Musculoskeletal: Normal range of motion.        General: No swelling, tenderness, deformity or signs of injury.  Skin:    General: Skin is warm and dry.  Neurological:     General: No focal deficit present.     Mental Status: She is alert and oriented to person, place, and time. Mental status is at baseline.     Cranial Nerves: No cranial nerve deficit.     Sensory: No sensory deficit.     Motor: No weakness.  Psychiatric:        Mood and Affect: Mood is anxious.        Behavior: Behavior normal.      ED Treatments / Results  Labs (all labs ordered are listed, but only abnormal results are displayed) Labs Reviewed  BASIC METABOLIC PANEL - Abnormal; Notable for the following components:      Result Value   Glucose, Bld 105 (*)  BUN 5 (*)    All other components within normal limits  CBC WITH DIFFERENTIAL/PLATELET - Abnormal; Notable for the following components:   WBC 3.9 (*)    RBC 3.74 (*)    MCV 101.9 (*)    MCH 36.1 (*)    All other components within normal limits  TSH  TROPONIN I (HIGH SENSITIVITY)    EKG EKG Interpretation  Date/Time:  Wednesday May 20 2019 14:47:32 EDT Ventricular Rate:  96 PR Interval:    QRS Duration: 92 QT Interval:  357 QTC Calculation: 452 R Axis:   81 Text Interpretation:  Sinus rhythm No significant change since last tracing Confirmed by Jacalyn LefevreHaviland, Julie [phone]([id number]) on 05/20/2019 3:42:11 PM   Radiology Dg Chest Portable 1 View  Result Date: 05/20/2019 CLINICAL DATA:  Chest pain EXAM: PORTABLE CHEST 1 VIEW COMPARISON:  None. FINDINGS: The heart size and mediastinal contours are within normal limits. Both lungs are clear. The visualized skeletal structures are unremarkable. IMPRESSION: No acute cardiopulmonary process. Electronically Signed   By: Jonna ClarkBindu  Avutu M.D.   On: 05/20/2019 15:31    Procedures Procedures (including critical care time)  Medications Ordered in ED Medications  LORazepam (ATIVAN) injection 2 mg (2 mg  Intravenous Given 05/20/19 1522)     Initial Impression / Assessment and Plan / ED Course  I have reviewed the triage vital signs and the nursing notes.  Pertinent labs & imaging results that were available during my care of the patient were reviewed by me and considered in my medical decision making (see chart for details).        Germain OsgoodJennifer E Bothun is a 48 y.o. female history of anxiety presents to the ED complaining of anxiety, palpitations, chest tightness, dizziness, diaphoresis, and blurred vision.  On exam, patient appears anxious but has no focal findings.  Patient was given 2 mg of IV Ativan for anxiety with improvement of her symptoms.  EKG shows normal sinus rhythm at a rate of 96, normal axis, normal intervals, and no acute ischemic changes.  BMP and CBC were unremarkable.  High-sensitivity troponin was negative at 15.  TSH within normal limits at 1.7.  Presentation consistent with anxiety attack.  Patient was given prescription for PRN Vistaril.  [facility]  Patient was discharged home in stable condition.  Final Clinical Impressions(s) / ED Diagnoses   Final diagnoses:  Anxiety attack    ED Discharge Orders         Ordered    hydrOXYzine (ATARAX/VISTARIL) 25 MG tablet  Every 6 hours PRN     05/20/19 1624           Garry HeaterEames, Basir Niven, MD 05/20/19 1629    Jacalyn LefevreHaviland, Julie, MD 05/20/19 [phone]

## 2020-04-06 ENCOUNTER — Telehealth (INDEPENDENT_AMBULATORY_CARE_PROVIDER_SITE_OTHER): Payer: Self-pay | Admitting: Primary Care

## 2020-06-24 ENCOUNTER — Ambulatory Visit: Payer: Self-pay

## 2020-06-24 ENCOUNTER — Other Ambulatory Visit: Payer: Self-pay

## 2020-06-24 ENCOUNTER — Encounter (HOSPITAL_COMMUNITY): Payer: Self-pay

## 2020-06-24 ENCOUNTER — Ambulatory Visit (HOSPITAL_COMMUNITY)
Admission: EM | Admit: 2020-06-24 | Discharge: 2020-06-24 | Disposition: A | Discharge status: Home / Self Care | Payer: Medicaid - Out of State | Attending: Internal Medicine | Admitting: Internal Medicine

## 2020-06-24 DIAGNOSIS — J069 Acute upper respiratory infection, unspecified: Secondary | ICD-10-CM | POA: Diagnosis present

## 2020-06-24 DIAGNOSIS — F17210 Nicotine dependence, cigarettes, uncomplicated: Secondary | ICD-10-CM | POA: Diagnosis not present

## 2020-06-24 DIAGNOSIS — R9431 Abnormal electrocardiogram [ECG] [EKG]: Secondary | ICD-10-CM | POA: Insufficient documentation

## 2020-06-24 DIAGNOSIS — Z20822 Contact with and (suspected) exposure to covid-19: Secondary | ICD-10-CM | POA: Diagnosis not present

## 2020-06-24 NOTE — Telephone Encounter (Signed)
Patient called back and we finished triage. Per protocol and chest pain  That goes to back with nausea dizziness vomiting and SOB she will seek care in ER tonight. Care advice was read to patient.  She verbalized understanding  Reason for Disposition . [1] Chest pain (or "angina") comes and goes AND [2] is happening more often (increasing in frequency) or getting worse (increasing in severity) (Exception: chest pains that last only a few seconds)  Answer Assessment - Initial Assessment Questions 1. LOCATION: "Where does it hurt?"       Left chest now all over 2. RADIATION: "Does the pain go anywhere else?" (e.g., into neck, jaw, arms, back)    Back lower 3. ONSET: "When did the chest pain begin?" (Minutes, hours or days)     5 days 4. PATTERN "Does the pain come and go, or has it been constant since it started?"  "Does it get worse with exertion?"     Comes and goes 5. DURATION: "How long does it last" (e.g., seconds, minutes, hours)     Few seconds 6. SEVERITY: "How bad is the pain?"  (e.g., Scale 1-10; mild, moderate, or severe)    - MILD (1-3): doesn't interfere with normal activities     - MODERATE (4-7): interferes with normal activities or awakens from sleep    - SEVERE (8-10): excruciating pain, unable to do any normal activities       Severe keep her up 7. CARDIAC RISK FACTORS: "Do you have any history of heart problems or risk factors for heart disease?" (e.g., angina, prior heart attack; diabetes, high blood pressure, high cholesterol, smoker, or strong family history of heart disease)     Hypertension possible. Smoker,unsure of history of Heart dx 8. PULMONARY RISK FACTORS: "Do you have any history of lung disease?"  (e.g., blood clots in lung, asthma, emphysema, birth control pills)    none 9. CAUSE: "What do you think is causing the chest pain?"   Unsure could be viral 10. OTHER SYMPTOMS: "Do you have any other symptoms?" (e.g., dizziness, nausea, vomiting, sweating, fever,  difficulty breathing, cough)       Sweats, dizzy sweats nausea vomiting SOB ocasoinal cough 11. PREGNANCY: "Is there any chance you are pregnant?" "When was your last menstrual period?"      N/A  Protocols used: CHEST PAIN-A-AH

## 2020-06-24 NOTE — Telephone Encounter (Signed)
Patient called with C/O chest pain and back pain. She states that she has been exposed to her brother who has had COVID-19 symptoms but never tested. She has been fully vaccinated. She states chest pain comes and goes She feels sweaty at times. She is a smoker. Call was dropped. I attempted to reconnect with patient but each number went to voice mail. Last message was for her to call us back or seek medical care at ER. I was unable to reconnect with her but left nurse line number for her to return call or seek medical care Triage was incomplete  Answer Assessment - Initial Assessment Questions 1. LOCATION: "Where does it hurt?"       Left chest initially 2. RADIATION: "Does the pain go anywhere else?" (e.g., into neck, jaw, arms, back)     lower ribs numbness to fingers at time 3. ONSET: "When did the chest pain begin?" (Minutes, hours or days)      About 5 days ago 4. PATTERN "Does the pain come and go, or has it been constant since it started?"  "Does it get worse with exertion?"     Comes and goes 5. DURATION: "How long does it last" (e.g., seconds, minutes, hours)      6. SEVERITY: "How bad is the pain?"  (e.g., Scale 1-10; mild, moderate, or severe)    - MILD (1-3): doesn't interfere with normal activities     - MODERATE (4-7): interferes with normal activities or awakens from sleep    - SEVERE (8-10): excruciating pain, unable to do any normal activities       *No Answer* 7. CARDIAC RISK FACTORS: "Do you have any history of heart problems or risk factors for heart disease?" (e.g., angina, prior heart attack; diabetes, high blood pressure, high cholesterol, smoker, or strong family history of heart disease)     *No Answer* 8. PULMONARY RISK FACTORS: "Do you have any history of lung disease?"  (e.g., blood clots in lung, asthma, emphysema, birth control pills)     *No Answer* 9. CAUSE: "What do you think is causing the chest pain?"     *No Answer* 10. OTHER SYMPTOMS: "Do you have any  other symptoms?" (e.g., dizziness, nausea, vomiting, sweating, fever, difficulty breathing, cough)       *No Answer* 11. PREGNANCY: "Is there any chance you are pregnant?" "When was your last menstrual period?"       *No Answer*  Protocols used: CHEST PAIN-A-AH

## 2020-06-24 NOTE — Discharge Instructions (Signed)
Your covid test is pending.  Take ibuprofen or tylenol for symptoms.  Go to the Emergency department if symptoms worsen or change.

## 2020-06-25 LAB — SARS CORONAVIRUS 2 (TAT 6-24 HRS): SARS Coronavirus 2: NEGATIVE

## 2020-06-25 NOTE — ED Provider Notes (Signed)
[facility]    CSN: [id number] Arrival date & time: 06/24/20  1955      History   Chief Complaint Chief Complaint  Patient presents with  . Chest Pain    HPI NEKISHA MCDIARMID is a 49 y.o. female.   The history is provided by the patient. No language interpreter was used.  Chest Pain Pain location:  Unable to specify Pain quality: aching   Pain radiates to:  Does not radiate Pain severity:  No pain Timing:  Constant Progression:  Worsening Context: breathing   Relieved by:  Nothing Worsened by:  Nothing Ineffective treatments:  None tried Associated symptoms: cough   Pt complains of cough and congestion.   History reviewed. No pertinent past medical history.  There are no problems to display for this patient.   History reviewed. No pertinent surgical history.  OB History   No obstetric history on file.      Home Medications    Prior to Admission medications   Medication Sig Start Date End Date Taking? Authorizing Provider  Aspirin-Salicylamide-Caffeine (BC HEADACHE POWDER PO) Take 1 packet by mouth as needed (headaches).   Yes [provider]  hydrOXYzine (ATARAX/VISTARIL) 25 MG tablet Take 1 tablet (25 mg total) by mouth every 6 (six) hours as needed for anxiety. 05/20/19  Yes Garry Heater, MD  HYDROcodone-acetaminophen (NORCO/VICODIN) 5-325 MG tablet Take 1 tablet by mouth every 6 (six) hours as needed for severe pain. Patient not taking: Reported on 05/20/2019 07/20/18   Shaune Pollack, MD  omeprazole (PRILOSEC) 20 MG capsule Take 1 capsule (20 mg total) by mouth daily for 14 days. Patient not taking: Reported on 05/20/2019 07/20/18 08/03/18  Shaune Pollack, MD  ondansetron (ZOFRAN ODT) 4 MG disintegrating tablet Take 1 tablet (4 mg total) by mouth every 8 (eight) hours as needed for nausea or vomiting. Patient not taking: Reported on 05/20/2019 07/20/18   Shaune Pollack, MD  sucralfate (CARAFATE) 1 g tablet Take 1 tablet (1 g total) by  mouth 4 (four) times daily -  with meals and at bedtime for 10 days. Patient not taking: Reported on 05/20/2019 07/20/18 07/30/18  Shaune Pollack, MD    Family History History reviewed. No pertinent family history.  Social History Social History   Tobacco Use  . Smoking status: Current Every Day Smoker    Packs/day: 1.00    Types: Cigarettes  . Smokeless tobacco: Never Used  Vaping Use  . Vaping Use: Never used  Substance Use Topics  . Alcohol use: Yes    Alcohol/week: 6.0 standard drinks    Types: 6 Cans of beer per week    Comment: Pt drinks 6 pack roughly during work week and drinks more on weekend  . Drug use: Never     Allergies   Patient has no known allergies.   Review of Systems Review of Systems  Respiratory: Positive for cough.   Cardiovascular: Positive for chest pain.  All other systems reviewed and are negative.    Physical Exam Triage Vital Signs ED Triage Vitals  Enc Vitals Group     BP 06/24/20 2042 (!) 148/95     Pulse Rate 06/24/20 2042 (!) 101     Resp 06/24/20 2042 18     Temp 06/24/20 2042 98.8 F (37.1 C)     Temp src --      SpO2 06/24/20 2042 99 %     Weight --      Height --  Head Circumference --      Peak Flow --      Pain Score 06/24/20 2046 6     Pain Loc --      Pain Edu? --      Excl. in GC? --    No data found.  Updated Vital Signs BP (!) 148/95 (BP Location: Left Arm)   Pulse (!) 101   Temp 98.8 F (37.1 C)   Resp 18   SpO2 99%   Visual Acuity Right Eye Distance:   Left Eye Distance:   Bilateral Distance:    Right Eye Near:   Left Eye Near:    Bilateral Near:     Physical Exam Vitals and nursing note reviewed.  Constitutional:      Appearance: She is well-developed.  HENT:     Head: Normocephalic.  Cardiovascular:     Rate and Rhythm: Normal rate and regular rhythm.     Heart sounds: Normal heart sounds.  Pulmonary:     Effort: Pulmonary effort is normal.  Abdominal:     General: There is no  distension.     Palpations: Abdomen is soft.  Musculoskeletal:        General: Normal range of motion.     Cervical back: Normal range of motion.  Skin:    General: Skin is warm.  Neurological:     Mental Status: She is alert and oriented to person, place, and time.  Psychiatric:        Mood and Affect: Mood normal.      UC Treatments / Results  Labs (all labs ordered are listed, but only abnormal results are displayed) Labs Reviewed  SARS CORONAVIRUS 2 (TAT 6-24 HRS)    EKG EKG no acute abnormality.  Pt looks good.  Covid ordered   Radiology No results found.  Procedures Procedures (including critical care time)  Medications Ordered in UC Medications - No data to display  Initial Impression / Assessment and Plan / UC Course  I have reviewed the triage vital signs and the nursing notes.  Pertinent labs & imaging results that were available during my care of the patient were reviewed by me and considered in my medical decision making (see chart for details).     MDM:  Pt advised tylenol covid pending  Final Clinical Impressions(s) / UC Diagnoses   Final diagnoses:  Viral URI     Discharge Instructions     Your covid test is pending.  Take ibuprofen or tylenol for symptoms.  Go to the Emergency department if symptoms worsen or change.    ED Prescriptions    None     PDMP not reviewed this encounter.  An After Visit Summary was printed and given to the patient.    Elson Areas, [place] 06/25/20 1036

## 2020-10-04 ENCOUNTER — Other Ambulatory Visit: Payer: Self-pay

## 2020-10-04 ENCOUNTER — Ambulatory Visit (HOSPITAL_COMMUNITY)
Admission: EM | Admit: 2020-10-04 | Discharge: 2020-10-04 | Disposition: A | Discharge status: Home / Self Care | Payer: Medicaid - Out of State | Attending: Registered Nurse | Admitting: Registered Nurse

## 2020-10-04 ENCOUNTER — Encounter (HOSPITAL_COMMUNITY): Payer: Self-pay | Admitting: Registered Nurse

## 2020-10-04 DIAGNOSIS — F1010 Alcohol abuse, uncomplicated: Secondary | ICD-10-CM | POA: Diagnosis present

## 2020-10-04 DIAGNOSIS — F1020 Alcohol dependence, uncomplicated: Secondary | ICD-10-CM | POA: Diagnosis present
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
## 2020-10-04 NOTE — ED Provider Notes (Signed)
Behavioral Health Urgent Care Medical Screening Exam  Patient Name: Kaitlin Carpenter MRN: [id number] Date of Evaluation: 10/04/20 Chief Complaint:   Diagnosis:  Final diagnoses:  None    History of Present illness: Kaitlin Carpenter is a 50 y.o. female patient presented to [facility] as a walk in with complaints of alcohol use disorder and requesting resources for rehab services  MAYTTE JACOT, 50 y.o., female patient seen face to face by this provider, consulted with Dr. Bronwen Betters; and chart reviewed on 10/04/20.  On evaluation Aalyiah Camberos Lisenby reports she only wants to get resources.  States she is not having any suicidal or homicidal thoughts.  Discussed CD IOP and she feels that inpatient rehab will be a better option for her.  States she is drinking 12 pack of beer and bottle wine daily.   During evaluation Page Pucciarelli Ramseyer is sitting upright in no acute distress.  She is alert, oriented x 4, calm and cooperative.  Her mood is euthymic with congruent affect.  She does not appear to be responding to internal/external stimuli or delusional thoughts.  Patient denies suicidal/self-harm/homicidal ideation, psychosis, and paranoia.  Patient answered question appropriately.    Psychiatric Specialty Exam  Presentation  General Appearance:No data recorded Eye Contact:No data recorded Speech:No data recorded Speech Volume:No data recorded Handedness:No data recorded  Mood and Affect  Mood:No data recorded Affect:No data recorded  Thought Process  Thought Processes:No data recorded Descriptions of Associations:No data recorded Orientation:No data recorded Thought Content:No data recorded Hallucinations:No data recorded Ideas of Reference:No data recorded Suicidal Thoughts:No data recorded Homicidal Thoughts:No data recorded  Sensorium  Memory:No data recorded Judgment:No data recorded Insight:No data recorded  Executive Functions  Concentration:No data recorded Attention Span:No data  recorded Recall:No data recorded Fund of Knowledge:No data recorded Language:No data recorded  Psychomotor Activity  Psychomotor Activity:No data recorded  Assets  Assets:No data recorded  Sleep  Sleep:No data recorded Number of hours: No data recorded  Physical Exam: Physical Exam Vitals and nursing note reviewed. Exam conducted with a chaperone present.  Constitutional:      General: She is not in acute distress.    Appearance: Normal appearance. She is not ill-appearing.  HENT:     Head: Normocephalic and atraumatic.  Eyes:     Pupils: Pupils are equal, round, and reactive to light.  Cardiovascular:     Rate and Rhythm: Normal rate and regular rhythm.  Pulmonary:     Effort: Pulmonary effort is normal.     Breath sounds: Normal breath sounds.  Musculoskeletal:        General: Normal range of motion.     Cervical back: Normal range of motion.  Skin:    General: Skin is warm and dry.  Neurological:     Mental Status: She is alert and oriented to person, place, and time.  Psychiatric:        Attention and Perception: Attention and perception normal. She does not perceive auditory or visual hallucinations.        Mood and Affect: Mood and affect normal.        Speech: Speech normal.        Behavior: Behavior normal. Behavior is cooperative.        Thought Content: Thought content normal. Thought content is not paranoid or delusional. Thought content does not include homicidal or suicidal ideation.        Cognition and Memory: Cognition and memory normal.        Judgment:  Judgment normal.    Review of Systems  Constitutional: Negative.   HENT: Negative.   Eyes: Negative.   Respiratory: Negative.   Cardiovascular: Negative.   Gastrointestinal: Negative.   Genitourinary: Negative.   Musculoskeletal: Negative.   Skin: Negative.   Neurological: Negative.   Endo/Heme/Allergies: Negative.   Psychiatric/Behavioral: Negative for hallucinations and memory loss.  Depression: Stable. Substance abuse: Alcohol. Suicidal ideas: Denies. The patient does not have insomnia. Nervous/anxious: Stable.    Blood pressure (!) 155/95, pulse 78, temperature (!) 97.3 F (36.3 C), temperature source Tympanic, resp. rate 18, SpO2 98 %. There is no height or weight on file to calculate BMI.  Musculoskeletal: Strength & Muscle Tone: within normal limits Gait & Station: normal Patient leans: N/A   [facility] MSE Discharge Disposition for Follow up and Recommendations: Based on my evaluation the patient does not appear to have an emergency medical condition and can be discharged with resources and follow up care in outpatient services for Medication Management, Substance Abuse Intensive Outpatient Program and Group Therapy   Follow-up Information    Go to  [facility].   Specialty: Urgent Care Why: Open Access:  Monday - Thursday from 8 am to 11 am for medication management and therapy intake.  On Friday from 1 pm to 4 pm for therapy intake only Contact information: [address] [place] [address] [phone]       Call  [facility]   Specialty: Addiction Medicine Contact information: [address] [place] [address] [phone]               Assunta Found, NP 10/04/2020, 6:22 PM

## 2020-10-04 NOTE — ED Notes (Signed)
Locker #[phone]  

## 2020-10-04 NOTE — ED Notes (Signed)
Discharge instructions provided and Pt stated understanding. No personal belongings to be returned from a locker. Pt alert, orient and ambulatory. Safety maintained.  

## 2020-10-10 ENCOUNTER — Telehealth (HOSPITAL_COMMUNITY): Payer: Self-pay | Admitting: General Practice

## 2020-10-10 NOTE — Telephone Encounter (Signed)
Care Management - Follow Up [facility] Discharges   Writer attempted to make contact with patient today and was unsuccessful.  Writer was not able to leave a voice mail message.  

## 2021-01-27 ENCOUNTER — Emergency Department (HOSPITAL_COMMUNITY)
Admission: EM | Admit: 2021-01-27 | Discharge: 2021-01-27 | Disposition: A | Discharge status: Home / Self Care | Payer: Medicaid - Out of State | Attending: Emergency Medicine | Admitting: Emergency Medicine

## 2021-01-27 ENCOUNTER — Other Ambulatory Visit: Payer: Self-pay

## 2021-01-27 ENCOUNTER — Emergency Department (HOSPITAL_COMMUNITY): Payer: Medicaid - Out of State

## 2021-01-27 ENCOUNTER — Encounter (HOSPITAL_COMMUNITY): Payer: Self-pay

## 2021-01-27 DIAGNOSIS — R112 Nausea with vomiting, unspecified: Secondary | ICD-10-CM | POA: Diagnosis not present

## 2021-01-27 DIAGNOSIS — R000 Tachycardia, unspecified: Secondary | ICD-10-CM | POA: Diagnosis not present

## 2021-01-27 DIAGNOSIS — R002 Palpitations: Secondary | ICD-10-CM | POA: Insufficient documentation

## 2021-01-27 DIAGNOSIS — R072 Precordial pain: Secondary | ICD-10-CM | POA: Diagnosis present

## 2021-01-27 DIAGNOSIS — Z20822 Contact with and (suspected) exposure to covid-19: Secondary | ICD-10-CM | POA: Diagnosis not present

## 2021-01-27 DIAGNOSIS — R079 Chest pain, unspecified: Secondary | ICD-10-CM

## 2021-01-27 DIAGNOSIS — Z7982 Long term (current) use of aspirin: Secondary | ICD-10-CM | POA: Diagnosis not present

## 2021-01-27 DIAGNOSIS — R0602 Shortness of breath: Secondary | ICD-10-CM | POA: Diagnosis not present

## 2021-01-27 DIAGNOSIS — R5383 Other fatigue: Secondary | ICD-10-CM | POA: Insufficient documentation

## 2021-01-27 DIAGNOSIS — F17210 Nicotine dependence, cigarettes, uncomplicated: Secondary | ICD-10-CM | POA: Diagnosis not present

## 2021-01-27 DIAGNOSIS — R61 Generalized hyperhidrosis: Secondary | ICD-10-CM | POA: Diagnosis not present

## 2021-01-27 LAB — RESP PANEL BY RT-PCR (FLU A&B, COVID) ARPGX2
Influenza A by PCR: NEGATIVE
Influenza B by PCR: NEGATIVE
SARS Coronavirus 2 by RT PCR: NEGATIVE

## 2021-01-27 LAB — HEPATIC FUNCTION PANEL
ALT: 47 U/L — ABNORMAL HIGH (ref 0–44)
AST: 62 U/L — ABNORMAL HIGH (ref 15–41)
Albumin: 3.7 g/dL (ref 3.5–5.0)
Alkaline Phosphatase: 54 U/L (ref 38–126)
Bilirubin, Direct: 0.3 mg/dL — ABNORMAL HIGH (ref 0.0–0.2)
Indirect Bilirubin: 0.6 mg/dL (ref 0.3–0.9)
Total Bilirubin: 0.9 mg/dL (ref 0.3–1.2)
Total Protein: 7.9 g/dL (ref 6.5–8.1)

## 2021-01-27 LAB — CBC
HCT: 44.5 % (ref 36.0–46.0)
Hemoglobin: 15.4 g/dL — ABNORMAL HIGH (ref 12.0–15.0)
MCH: 38.6 pg — ABNORMAL HIGH (ref 26.0–34.0)
MCHC: 34.6 g/dL (ref 30.0–36.0)
MCV: 111.5 fL — ABNORMAL HIGH (ref 80.0–100.0)
Platelets: 175 10*3/uL (ref 150–400)
RBC: 3.99 MIL/uL (ref 3.87–5.11)
RDW: 13.9 % (ref 11.5–15.5)
WBC: 12.5 10*3/uL — ABNORMAL HIGH (ref 4.0–10.5)
nRBC: 0 % (ref 0.0–0.2)

## 2021-01-27 LAB — TROPONIN I (HIGH SENSITIVITY)
Troponin I (High Sensitivity): 10 ng/L (ref <18)
Troponin I (High Sensitivity): 10 ng/L (ref <18)

## 2021-01-27 LAB — BASIC METABOLIC PANEL
Anion gap: 10 (ref 5–15)
BUN: 8 mg/dL (ref 6–20)
CO2: 26 mmol/L (ref 22–32)
Calcium: 9.3 mg/dL (ref 8.9–10.3)
Chloride: 99 mmol/L (ref 98–111)
Creatinine, Ser: 0.91 mg/dL (ref 0.44–1.00)
GFR, Estimated: >60 mL/min (ref >60)
Glucose, Bld: 95 mg/dL (ref 70–99)
Potassium: 3.2 mmol/L — ABNORMAL LOW (ref 3.5–5.1)
Sodium: 135 mmol/L (ref 135–145)

## 2021-01-27 LAB — D-DIMER, QUANTITATIVE: D-Dimer, Quant: 3.94 ug/mL-FEU — ABNORMAL HIGH (ref 0.00–0.50)

## 2021-01-27 LAB — I-STAT BETA HCG BLOOD, ED (MC, WL, AP ONLY): I-stat hCG, quantitative: <5 m[IU]/mL (ref <5)

## 2021-01-27 LAB — LIPASE, BLOOD: Lipase: 30 U/L (ref 11–51)

## 2021-01-27 LAB — TSH: TSH: 1.437 u[IU]/mL (ref 0.350–4.500)

## 2021-01-27 LAB — MAGNESIUM: Magnesium: 1.6 mg/dL — ABNORMAL LOW (ref 1.7–2.4)

## 2021-01-27 MED ORDER — ACETAMINOPHEN 325 MG PO TABS: 650.0000 mg | ORAL_TABLET | Freq: Once | ORAL | Status: DC

## 2021-01-27 MED ORDER — POTASSIUM CHLORIDE CRYS ER 20 MEQ PO TBCR: 40.0000 meq | EXTENDED_RELEASE_TABLET | Freq: Once | ORAL | Status: DC

## 2021-01-27 MED ORDER — IOHEXOL 350 MG/ML SOLN
75.0000 mL | Freq: Once | INTRAVENOUS | Status: AC | PRN
  Administered 2021-01-27: 75 mL via INTRAVENOUS

## 2021-01-27 MED ORDER — IBUPROFEN 400 MG PO TABS: 600.0000 mg | ORAL_TABLET | Freq: Once | ORAL | Status: DC

## 2021-01-27 MED ORDER — ALUM & MAG HYDROXIDE-SIMETH 200-200-20 MG/5ML PO SUSP
30.0000 mL | Freq: Once | ORAL | Status: AC
  Administered 2021-01-27: 30 mL via ORAL
  Filled 2021-01-27: qty 30

## 2021-01-27 MED ORDER — LIDOCAINE VISCOUS HCL 2 % MT SOLN
15.0000 mL | Freq: Once | OROMUCOSAL | Status: AC
  Administered 2021-01-27: 15 mL via ORAL
  Filled 2021-01-27: qty 15

## 2021-01-27 NOTE — ED Provider Notes (Signed)
[facility] EMERGENCY DEPARTMENT Provider Note   CSN: [id number] Arrival date & time: 01/27/21  1157     History Chief Complaint  Patient presents with  . Chest Pain    Kaitlin Carpenter is a 50 y.o. female.  The history is provided by the patient and medical records. No language interpreter was used.  Chest Pain Pain location:  L chest and substernal area Pain quality: aching, burning, pressure and tightness   Pain radiates to:  Does not radiate Pain severity:  Moderate Onset quality:  Gradual Duration:  2 days Timing:  Intermittent Progression:  Waxing and waning Chronicity:  New Relieved by:  Nothing Worsened by:  Exertion Ineffective treatments:  None tried Associated symptoms: diaphoresis, fatigue, nausea, palpitations, shortness of breath and vomiting   Associated symptoms: no abdominal pain, no altered mental status, no back pain, no claudication, no cough, no dizziness, no fever, no headache, no lower extremity edema and no near-syncope   Risk factors: not female and no prior DVT/PE        History reviewed. No pertinent past medical history.  Patient Active Problem List   Diagnosis Date Noted  . Alcohol use disorder, severe, in controlled environment (HCC) 10/04/2020    History reviewed. No pertinent surgical history.   OB History   No obstetric history on file.     History reviewed. No pertinent family history.  Social History   Tobacco Use  . Smoking status: Current Every Day Smoker    Packs/day: 1.00    Types: Cigarettes  . Smokeless tobacco: Never Used  Vaping Use  . Vaping Use: Never used  Substance Use Topics  . Alcohol use: Yes    Alcohol/week: 6.0 standard drinks    Types: 6 Cans of beer per week    Comment: Pt drinks 6 pack roughly during work week and drinks more on weekend  . Drug use: Never    Home Medications Prior to Admission medications   Medication Sig Start Date End Date Taking? Authorizing Provider   Aspirin-Salicylamide-Caffeine (BC HEADACHE POWDER PO) Take 1 packet by mouth as needed (headaches).    [provider]  HYDROcodone-acetaminophen (NORCO/VICODIN) 5-325 MG tablet Take 1 tablet by mouth every 6 (six) hours as needed for severe pain. Patient not taking: Reported on 05/20/2019 07/20/18   Shaune Pollack, MD  hydrOXYzine (ATARAX/VISTARIL) 25 MG tablet Take 1 tablet (25 mg total) by mouth every 6 (six) hours as needed for anxiety. 05/20/19   Garry Heater, MD  omeprazole (PRILOSEC) 20 MG capsule Take 1 capsule (20 mg total) by mouth daily for 14 days. Patient not taking: Reported on 05/20/2019 07/20/18 08/03/18  Shaune Pollack, MD  ondansetron (ZOFRAN ODT) 4 MG disintegrating tablet Take 1 tablet (4 mg total) by mouth every 8 (eight) hours as needed for nausea or vomiting. Patient not taking: Reported on 05/20/2019 07/20/18   Shaune Pollack, MD  sucralfate (CARAFATE) 1 g tablet Take 1 tablet (1 g total) by mouth 4 (four) times daily -  with meals and at bedtime for 10 days. Patient not taking: Reported on 05/20/2019 07/20/18 07/30/18  Shaune Pollack, MD    Allergies    Patient has no known allergies.  Review of Systems   Review of Systems  Constitutional: Positive for chills, diaphoresis and fatigue. Negative for fever.  HENT: Negative for congestion.   Eyes: Negative for visual disturbance.  Respiratory: Positive for chest tightness and shortness of breath. Negative for cough and wheezing.   Cardiovascular:  Positive for chest pain and palpitations. Negative for claudication, leg swelling and near-syncope.  Gastrointestinal: Positive for diarrhea, nausea and vomiting. Negative for abdominal pain and constipation.  Genitourinary: Negative for dysuria and flank pain.  Musculoskeletal: Negative for back pain and neck pain.  Skin: Negative for rash and wound.  Neurological: Negative for dizziness, light-headedness and headaches.  Psychiatric/Behavioral: Negative for  agitation.  All other systems reviewed and are negative.   Physical Exam Updated Vital Signs BP (!) 168/106 (BP Location: Right Arm)   Pulse 100   Temp 99.1 F (37.3 C) (Oral)   Resp 18   Ht 5\' 3"  (1.6 m)   Wt 57.2 kg   SpO2 100%   BMI 22.32 kg/m   Physical Exam Vitals and nursing note reviewed.  Constitutional:      General: She is not in acute distress.    Appearance: She is well-developed. She is not ill-appearing, toxic-appearing or diaphoretic.  HENT:     Head: Normocephalic and atraumatic.  Eyes:     Conjunctiva/sclera: Conjunctivae normal.     Pupils: Pupils are equal, round, and reactive to light.  Cardiovascular:     Rate and Rhythm: Regular rhythm. Tachycardia present.     Heart sounds: Normal heart sounds. No murmur heard.   Pulmonary:     Effort: Pulmonary effort is normal. No respiratory distress.     Breath sounds: Normal breath sounds. No decreased breath sounds, wheezing, rhonchi or rales.  Chest:     Chest wall: No tenderness.  Abdominal:     Palpations: Abdomen is soft.     Tenderness: There is no abdominal tenderness.  Musculoskeletal:     Cervical back: Neck supple.     Right lower leg: No tenderness. No edema.     Left lower leg: No tenderness. No edema.  Skin:    General: Skin is warm and dry.  Neurological:     Mental Status: She is alert.  Psychiatric:        Mood and Affect: Mood is anxious.     ED Results / Procedures / Treatments   Labs (all labs ordered are listed, but only abnormal results are displayed) Labs Reviewed  BASIC METABOLIC PANEL - Abnormal; Notable for the following components:      Result Value   Potassium 3.2 (*)    All other components within normal limits  CBC - Abnormal; Notable for the following components:   WBC 12.5 (*)    Hemoglobin 15.4 (*)    MCV 111.5 (*)    MCH 38.6 (*)    All other components within normal limits  HEPATIC FUNCTION PANEL - Abnormal; Notable for the following components:   AST 62  (*)    ALT 47 (*)    Bilirubin, Direct 0.3 (*)    All other components within normal limits  MAGNESIUM - Abnormal; Notable for the following components:   Magnesium 1.6 (*)    All other components within normal limits  D-DIMER, QUANTITATIVE - Abnormal; Notable for the following components:   D-Dimer, Quant 3.94 (*)    All other components within normal limits  RESP PANEL BY RT-PCR (FLU A&B, COVID) ARPGX2  LIPASE, BLOOD  TSH  I-STAT BETA HCG BLOOD, ED ([facility], [facility], [facility] ONLY)  TROPONIN I (HIGH SENSITIVITY)  TROPONIN I (HIGH SENSITIVITY)    EKG EKG Interpretation  Date/Time:  Friday January 27 2021 12:11:01 EDT Ventricular Rate:  98 PR Interval:  124 QRS Duration: 80 QT Interval:  350 QTC  Calculation: 446 R Axis:   76 Text Interpretation: Normal sinus rhythm Nonspecific ST abnormality Abnormal ECG when compared to prior, faster rate. No STEMI Confirmed by Theda Belfast ([id number]) on 01/27/2021 12:20:13 PM   Radiology DG Chest 1 View  Result Date: 01/27/2021 CLINICAL DATA:  Palpitations, nausea, shivers, shortness of breath, and chest pain starting last night. Possible left basilar nodule. EXAM: CHEST  1 VIEW COMPARISON:  01/27/2021 at 12:21 p.m. FINDINGS: Nipple markers are present. The left basilar nodular density appears to correspond to the left nipple based on the marker position, and has also shifted to a position below the diaphragm based on distribution of soft tissues. Accordingly this is felt to be benign. The lungs appear clear. Cardiac and mediastinal margins appear normal. No blunting of the costophrenic angles. IMPRESSION: 1. Nodular density at the left lung base corresponds to the nipple shadow. The lungs appear clear. Electronically Signed   By: Gaylyn Rong M.D.   On: 01/27/2021 14:04   DG Chest 2 View  Result Date: 01/27/2021 CLINICAL DATA:  Left-sided chest pain. EXAM: CHEST - 2 VIEW COMPARISON:  None. FINDINGS: The lungs are clear without focal pneumonia, edema,  pneumothorax or pleural effusion. Nodular opacity at the medial left base is probably a nipple shadow. The cardiopericardial silhouette is within normal limits for size. The visualized bony structures of the thorax show no acute abnormality. IMPRESSION: Probable nipple shadow at the medial left lung base. Repeat frontal radiograph with nipple markers recommended to confirm. Otherwise unremarkable exam. Electronically Signed   By: Kennith Center M.D.   On: 01/27/2021 12:47    Procedures Procedures   Medications Ordered in ED Medications  alum & mag hydroxide-simeth (MAALOX/MYLANTA) 200-200-20 MG/5ML suspension 30 mL (30 mLs Oral Given 01/27/21 1347)    And  lidocaine (XYLOCAINE) 2 % viscous mouth solution 15 mL (15 mLs Oral Given 01/27/21 1348)  iohexol (OMNIPAQUE) 350 MG/ML injection 75 mL (75 mLs Intravenous Contrast Given 01/27/21 1620)    ED Course  I have reviewed the triage vital signs and the nursing notes.  Pertinent labs & imaging results that were available during my care of the patient were reviewed by me and considered in my medical decision making (see chart for details).    MDM Rules/Calculators/A&P                          Kaitlin Carpenter is a 50 y.o. female with a past medical history significant for reported gastric ulcers and alcohol abuse who presents with 2 days of chest pain, palpitations, shortness of breath.  Patient reports that he has had some diarrhea on and off for the last month but that has not changed recently.  She reports that since yesterday, she is been having discomfort in her left chest that does not radiate.  She reports it does not feel exactly like her reflux in the past but it is burning and aching and pressure.  She reports it is worse with exertion.  She reports there was associated shortness of breath, diaphoresis, nausea, and vomiting.  She reports palpitations and fast heart rate sensation.  She has not felt this before.  She reports she did not pass out  but was more fatigued.  She has had some sweats but denies any actual fevers.  She denies any sick contacts or COVID exposures and she reports she is vaccinated.  She denies any trauma.  She denies any hemoptysis.  She denies  any constipation or urinary changes.  No leg pain or leg swelling.  On exam, lungs are clear and chest is nontender.  No murmur.  Abdomen is nontender.  Good pulses in extremities.  No lower extremity tenderness or edema.  Upper extremity pulses also intact.  Patient resting comfortably.  Heart rate is right around 100 on arrival.  EKG does not show STEMI.  Heart score calculated as a 4.  Given patient's history of reported reflux ulcers and alcohol use, I do suspect she may have an alcohol type gastritis or esophagitis causing her discomfort today.  However given the palpitations, chest pain, shortness of breath, and sensation of tachycardia, will get further work-up.  We will get a COVID swab given the ongoing pandemic and her report of sweats, diarrhea, shortness of breath, and chest tightness.  We will get chest x-ray and other labs.  We will get a troponin and a D-dimer as her heart rate is just over 100 and she is over 50.  We will give her a GI cocktail however to see if this helps with the discomfort she is experiencing.  Anticipate reassessment to determine disposition.  D-dimer is elevated, will get CT PE study.  COVID and flu test negative.  Initial troponin negative.  Mild leukocytosis present.  Chest x-ray initially showed opacity but a repeat x-ray with nipple marker was recommended.  Repeat x-ray shows the opacity was likely nipple.  She is getting a CT scan regardless due to the elevated D-dimer.  Anticipate reassessment after work-up is completed.  Care transferred to oncoming team while waiting for results of CT PE study.  Anticipate discharge if work-up is reassuring and she is feeling better.   Final Clinical Impression(s) / ED Diagnoses Final diagnoses:   Chest pain     Clinical Impression: 1. Chest pain    Care transferred to oncoming team while waiting for results of CT PE study.  Anticipate discharge if work-up is reassuring and she is feeling bette Disposition: Admit  This note was prepared with assistance of Dragon voice recognition software. Occasional wrong-word or sound-a-like substitutions may have occurred due to the inherent limitations of voice recognition software.     Mauricio Dahlen, Canary Brim, MD 01/27/21 1622

## 2021-01-27 NOTE — ED Notes (Signed)
Patient transported to X-ray 

## 2021-01-27 NOTE — Discharge Instructions (Signed)
Call your primary care doctor or specialist as discussed in the next 2-3 days.   Return immediately back to the ER if:  Your symptoms worsen within the next 12-24 hours. You develop new symptoms such as new fevers, persistent vomiting, new pain, shortness of breath, or new weakness or numbness, or if you have any other concerns.  

## 2021-01-27 NOTE — ED Triage Notes (Signed)
Pt from home with complaints of chest pain and palpitations that started 2 days ago but worsened this morning. Denies shob and cough

## 2021-01-27 NOTE — ED Provider Notes (Signed)
Patient signed out to me pending continuing review of the chest for pulmonary embolism.  This is unremarkable per radiology.  2 sets troponins were sent which are negative.  Given her history of symptoms for 2 days and negative work-up today I doubt acute coronary syndrome.  Patient complaining of headache, given Tylenol Motrin potassium was replaced.  Advised outpatient follow-up with her doctor and cardiologist within the week, advised immediate return for worsening pain fevers or any additional concerns.    Cheryll Cockayne, MD 01/27/21 [id number]

## 2021-03-23 ENCOUNTER — Encounter (HOSPITAL_COMMUNITY): Payer: Self-pay | Admitting: *Deleted

## 2021-03-23 ENCOUNTER — Other Ambulatory Visit: Payer: Self-pay

## 2021-03-23 ENCOUNTER — Emergency Department (HOSPITAL_COMMUNITY)
Admission: EM | Admit: 2021-03-23 | Discharge: 2021-03-23 | Disposition: A | Discharge status: Home / Self Care | Payer: Medicaid - Out of State | Attending: Emergency Medicine | Admitting: Emergency Medicine

## 2021-03-23 DIAGNOSIS — Z5321 Procedure and treatment not carried out due to patient leaving prior to being seen by health care provider: Secondary | ICD-10-CM | POA: Diagnosis not present

## 2021-03-23 DIAGNOSIS — M5441 Lumbago with sciatica, right side: Secondary | ICD-10-CM | POA: Insufficient documentation

## 2021-03-23 NOTE — ED Triage Notes (Signed)
Pt complains of low back pain radiating down right leg x 1 week. Hx of sciatica

## 2021-03-28 ENCOUNTER — Encounter (HOSPITAL_COMMUNITY): Payer: Self-pay | Admitting: Emergency Medicine

## 2021-03-28 ENCOUNTER — Other Ambulatory Visit: Payer: Self-pay

## 2021-03-28 ENCOUNTER — Ambulatory Visit (HOSPITAL_COMMUNITY)
Admission: EM | Admit: 2021-03-28 | Discharge: 2021-03-28 | Disposition: A | Discharge status: Home / Self Care | Payer: Self-pay | Attending: Family Medicine | Admitting: Family Medicine

## 2021-03-28 DIAGNOSIS — M5441 Lumbago with sciatica, right side: Secondary | ICD-10-CM

## 2021-03-28 MED ORDER — HYDROCODONE-ACETAMINOPHEN 5-325 MG PO TABS: 1.0000 | ORAL_TABLET | Freq: Four times a day (QID) | ORAL | 0 refills | Status: DC | PRN

## 2021-03-28 MED ORDER — DEXAMETHASONE SODIUM PHOSPHATE 10 MG/ML IJ SOLN
10.0000 mg | Freq: Once | INTRAMUSCULAR | Status: AC
  Administered 2021-03-28: 10 mg via INTRAMUSCULAR

## 2021-03-28 MED ORDER — DEXAMETHASONE SODIUM PHOSPHATE 10 MG/ML IJ SOLN
INTRAMUSCULAR | Status: AC
  Filled 2021-03-28: qty 1

## 2021-03-28 MED ORDER — MELOXICAM 15 MG PO TABS: 15.0000 mg | ORAL_TABLET | Freq: Every day | ORAL | 0 refills | Status: DC

## 2021-03-28 NOTE — ED Triage Notes (Signed)
Right lower back / right hip pain for 2 weeks.   Was seen at [facility] 6/23 for same

## 2021-03-28 NOTE — Discharge Instructions (Addendum)
Be aware, you have been prescribed pain medications that may cause drowsiness. While taking this medication, do not take any other medications containing acetaminophen (Tylenol). Do not combine with alcohol or other illicit drugs. Please do not drive, operate heavy machinery, or take part in activities that require making important decisions while on this medication as your judgement may be clouded.  HOME CARE INSTRUCTIONS: For many people, back pain returns. Since low back pain is rarely dangerous, it is often a condition that people can learn to manage on their own. Please remain active. It is stressful on the back to sit or stand in one place. Do not sit, drive, or stand in one place for more than 30 minutes at a time. Take short walks on level surfaces as soon as pain allows. Try to increase the length of time you walk each day. Do not stay in bed. Resting more than 1 or 2 days can delay your recovery. Do not avoid exercise or work. Your body is made to move. It is not dangerous to be active, even though your back may hurt. Your back will likely heal faster if you return to being active before your pain is gone. Over-the-counter medicines to reduce pain and inflammation are often the most helpful.  SEEK MEDICAL CARE IF: You have pain that is not relieved with rest or medicine. You have pain that does not improve in 1 week. You have new symptoms. You are generally not feeling well.  SEEK IMMEDIATE MEDICAL CARE IF: You have pain that radiates from your back into your legs. You develop new bowel or bladder control problems. You have unusual weakness or numbness in your arms or legs. You develop nausea or vomiting. You develop abdominal pain. You feel faint.

## 2021-03-28 NOTE — ED Provider Notes (Signed)
[facility]   [id number] 03/28/21 Arrival Time: 1444  ASSESSMENT & PLAN:  1. Acute right-sided low back pain with right-sided sciatica    Able to ambulate here and hemodynamically stable. No indication for imaging of back at this time given no trauma and normal neurological exam. Discussed.  Meds ordered this encounter  Medications   meloxicam (MOBIC) 15 MG tablet    Sig: Take 1 tablet (15 mg total) by mouth daily.    Dispense:  30 tablet    Refill:  0   dexamethasone (DECADRON) injection 10 mg   HYDROcodone-acetaminophen (NORCO/VICODIN) 5-325 MG tablet    Sig: Take 1 tablet by mouth every 6 (six) hours as needed for moderate pain or severe pain.    Dispense:  10 tablet    Refill:  0   Medication sedation precautions given. Encourage ROM/movement as tolerated.  Recommend:  Follow-up Information     [facility] EMERGENCY DEPARTMENT.   Specialty: Emergency Medicine Why: If symptoms worsen in any way. Contact information: [address] [id number] mc [place] [address] [phone]        [facility].   Why: If worsening or failing to improve as anticipated. Contact information: [address] [place] [address] [phone]                 Discharge Instructions      Be aware, you have been prescribed pain medications that may cause drowsiness. While taking this medication, do not take any other medications containing acetaminophen (Tylenol). Do not combine with alcohol or other illicit drugs. Please do not drive, operate heavy machinery, or take part in activities that require making important decisions while on this medication as your judgement may be clouded.  HOME CARE INSTRUCTIONS: For many people, back pain returns. Since low back pain is rarely dangerous, it is often a condition that people can learn to manage on their own. Please remain active. It is  stressful on the back to sit or stand in one place. Do not sit, drive, or stand in one place for more than 30 minutes at a time. Take short walks on level surfaces as soon as pain allows. Try to increase the length of time you walk each day. Do not stay in bed. Resting more than 1 or 2 days can delay your recovery. Do not avoid exercise or work. Your body is made to move. It is not dangerous to be active, even though your back may hurt. Your back will likely heal faster if you return to being active before your pain is gone. Over-the-counter medicines to reduce pain and inflammation are often the most helpful.  SEEK MEDICAL CARE IF: You have pain that is not relieved with rest or medicine. You have pain that does not improve in 1 week. You have new symptoms. You are generally not feeling well.  SEEK IMMEDIATE MEDICAL CARE IF: You have pain that radiates from your back into your legs. You develop new bowel or bladder control problems. You have unusual weakness or numbness in your arms or legs. You develop nausea or vomiting. You develop abdominal pain. You feel faint.     Reviewed expectations re: course of current medical issues. Questions answered. Outlined signs and symptoms indicating need for more acute intervention. Patient verbalized understanding. After Visit Summary given.   SUBJECTIVE: History from: patient.  Kaitlin Carpenter is a 50 y.o. female who presents with complaint of  intermittent right sided lower back discomfort. Onset gradual. First noted  over past couple of weeks . Injury/trama: none reported. History of back problems requiring medical care: occasional. Pain described as aching and with radiation down R posterior leg . Aggravating factors: certain movements and prolonged walking/standing. Alleviating factors: have not been identified. Progressive LE weakness or saddle anesthesia: none. Extremity sensation changes or weakness: none. Ambulatory without difficulty.  Normal bowel/bladder habits: yes; without urinary retention. Normal PO intake without n/v. No associated abdominal pain/n/v. Self treatment: has  Mobic with mild relief; temporary .  Reports no chronic steroid use, fevers, IV drug use, or recent back surgeries or procedures.  OBJECTIVE:  Vitals:   03/28/21 1533  BP: (!) 142/90  Pulse: 74  Resp: 16  Temp: 98.9 F (37.2 C)  TempSrc: Oral  SpO2: 100%    General appearance: alert; no distress HEENT: [place]; AT Neck: supple with FROM; without midline tenderness CV: regular Lungs: unlabored respirations; speaks full sentences without difficulty Abdomen: soft, non-tender; non-distended Back: poorly localized tenderness to palpation over lower R lumbar musculature ; FROM at waist; bruising: none; without midline tenderness Extremities: without edema; symmetrical without gross deformities; normal ROM of bilateral LE Skin: warm and dry Neurologic: normal gait; normal sensation and strength of bilateral LE Psychological: alert and cooperative; normal mood and affect  No Known Allergies  History reviewed. No pertinent past medical history. Social History   Socioeconomic History   Marital status: Divorced    Spouse name: Not on file   Number of children: Not on file   Years of education: Not on file   Highest education level: Not on file  Occupational History   Not on file  Tobacco Use   Smoking status: Every Day    Packs/day: 1.00    Pack years: 0.00    Types: Cigarettes   Smokeless tobacco: Never  Vaping Use   Vaping Use: Never used  Substance and Sexual Activity   Alcohol use: Yes    Alcohol/week: 6.0 standard drinks    Types: 6 Cans of beer per week    Comment: Pt drinks 6 pack roughly during work week and drinks more on weekend   Drug use: Never   Sexual activity: Not Currently  Other Topics Concern   Not on file  Social History Narrative   Not on file   Social Determinants of Health   Financial Resource Strain: Not  on file  Food Insecurity: Not on file  Transportation Needs: Not on file  Physical Activity: Not on file  Stress: Not on file  Social Connections: Not on file  Intimate Partner Violence: Not on file   No family history on file. History reviewed. No pertinent surgical history.    Mardella Layman, MD 03/29/21 [phone]

## 2021-04-07 ENCOUNTER — Other Ambulatory Visit: Payer: Self-pay

## 2021-04-07 ENCOUNTER — Emergency Department (HOSPITAL_COMMUNITY)
Admission: EM | Admit: 2021-04-07 | Discharge: 2021-04-07 | Disposition: A | Discharge status: Home / Self Care | Payer: Medicaid - Out of State | Attending: Emergency Medicine | Admitting: Emergency Medicine

## 2021-04-07 ENCOUNTER — Encounter (HOSPITAL_COMMUNITY): Payer: Self-pay

## 2021-04-07 ENCOUNTER — Emergency Department (HOSPITAL_COMMUNITY): Payer: Medicaid - Out of State

## 2021-04-07 DIAGNOSIS — M5431 Sciatica, right side: Secondary | ICD-10-CM

## 2021-04-07 DIAGNOSIS — M545 Low back pain, unspecified: Secondary | ICD-10-CM | POA: Diagnosis present

## 2021-04-07 DIAGNOSIS — M79604 Pain in right leg: Secondary | ICD-10-CM | POA: Diagnosis not present

## 2021-04-07 DIAGNOSIS — M5441 Lumbago with sciatica, right side: Secondary | ICD-10-CM | POA: Diagnosis not present

## 2021-04-07 DIAGNOSIS — M25559 Pain in unspecified hip: Secondary | ICD-10-CM | POA: Diagnosis not present

## 2021-04-07 DIAGNOSIS — F17210 Nicotine dependence, cigarettes, uncomplicated: Secondary | ICD-10-CM | POA: Insufficient documentation

## 2021-04-07 MED ORDER — METHYLPREDNISOLONE SODIUM SUCC 125 MG IJ SOLR
125.0000 mg | Freq: Once | INTRAMUSCULAR | Status: AC
  Administered 2021-04-07: 125 mg via INTRAMUSCULAR
  Filled 2021-04-07: qty 2

## 2021-04-07 MED ORDER — OXYCODONE-ACETAMINOPHEN 5-325 MG PO TABS
1.0000 | ORAL_TABLET | Freq: Once | ORAL | Status: AC
  Administered 2021-04-07: 1 via ORAL
  Filled 2021-04-07: qty 1

## 2021-04-07 MED ORDER — OXYCODONE-ACETAMINOPHEN 5-325 MG PO TABS: 1.0000 | ORAL_TABLET | Freq: Four times a day (QID) | ORAL | 0 refills | Status: DC | PRN

## 2021-04-07 MED ORDER — PREDNISONE 10 MG PO TABS: 20.0000 mg | ORAL_TABLET | Freq: Every day | ORAL | 0 refills | Status: DC

## 2021-04-07 MED ORDER — HYDROMORPHONE HCL 1 MG/ML IJ SOLN
1.0000 mg | Freq: Once | INTRAMUSCULAR | Status: AC
  Administered 2021-04-07: 1 mg via INTRAMUSCULAR
  Filled 2021-04-07: qty 1

## 2021-04-07 NOTE — ED Provider Notes (Addendum)
[facility]-EMERGENCY DEPT Provider Note   CSN: [id number] Arrival date & time: 04/07/21  8413     History Chief Complaint  Patient presents with   Hip Pain   Leg Pain    Kaitlin Carpenter is a 50 y.o. female.  Patient complains of pain radiating down her right leg.  Some pain in her spine.  Patient states this been going on for months just getting worse.  The history is provided by the patient and medical records. No language interpreter was used.  Hip Pain This is a recurrent problem. The current episode started more than 1 week ago. The problem occurs constantly. The problem has not changed since onset.Pertinent negatives include no chest pain, no abdominal pain and no headaches. Nothing aggravates the symptoms. Nothing relieves the symptoms. She has tried nothing for the symptoms. The treatment provided no relief.  Leg Pain Associated symptoms: no back pain and no fatigue       History reviewed. No pertinent past medical history.  Patient Active Problem List   Diagnosis Date Noted   Alcohol use disorder, severe, in controlled environment (HCC) 10/04/2020    Past Surgical History:  Procedure Laterality Date   NECK SURGERY     SHOULDER SURGERY Left      OB History   No obstetric history on file.     Family History  Family history unknown: Yes    Social History   Tobacco Use   Smoking status: Every Day    Packs/day: 1.00    Pack years: 0.00    Types: Cigarettes   Smokeless tobacco: Never  Vaping Use   Vaping Use: Never used  Substance Use Topics   Alcohol use: Yes    Alcohol/week: 6.0 standard drinks    Types: 6 Cans of beer per week   Drug use: Never    Home Medications Prior to Admission medications   Medication Sig Start Date End Date Taking? Authorizing Provider  oxyCODONE-acetaminophen (PERCOCET) 5-325 MG tablet Take 1 tablet by mouth every 6 (six) hours as needed. 04/07/21  Yes Bethann Berkshire, MD  predniSONE (DELTASONE) 10 MG  tablet Take 2 tablets (20 mg total) by mouth daily. 04/07/21  Yes Bethann Berkshire, MD  Aspirin-Salicylamide-Caffeine [facility] HEADACHE POWDER PO) Take 1-2 packets by mouth 2 (two) times daily as needed (headaches).    [provider]  HYDROcodone-acetaminophen (NORCO/VICODIN) 5-325 MG tablet Take 1 tablet by mouth every 6 (six) hours as needed for moderate pain or severe pain. 03/28/21   Mardella Layman, MD  meloxicam (MOBIC) 15 MG tablet Take 1 tablet (15 mg total) by mouth daily. 03/28/21   Mardella Layman, MD    Allergies    Patient has no known allergies.  Review of Systems   Review of Systems  Constitutional:  Negative for appetite change and fatigue.  HENT:  Negative for congestion, ear discharge and sinus pressure.   Eyes:  Negative for discharge.  Respiratory:  Negative for cough.   Cardiovascular:  Negative for chest pain.  Gastrointestinal:  Negative for abdominal pain and diarrhea.  Genitourinary:  Negative for frequency and hematuria.  Musculoskeletal:  Negative for back pain.       Right leg pain, tenderness to lumbar spine  Skin:  Negative for rash.  Neurological:  Negative for seizures and headaches.  Psychiatric/Behavioral:  Negative for hallucinations.    Physical Exam Updated Vital Signs BP (!) 139/102 (BP Location: Right Arm)   Pulse 75   Temp 98.7 F (  37.1 C) (Oral)   Resp 14   Ht 5\' 4"  (1.626 m)   Wt 59 kg   LMP 01/05/2021 (Within Days) Comment: Pt has not been active in a year and pt states she's not getting periods anymore.  SpO2 100%   BMI 22.31 kg/m   Physical Exam Vitals and nursing note reviewed.  Constitutional:      Appearance: Normal appearance. She is well-developed.  HENT:     Head: Normocephalic.     Nose: Nose normal.  Eyes:     General: No scleral icterus.    Conjunctiva/sclera: Conjunctivae normal.  Neck:     Thyroid: No thyromegaly.  Cardiovascular:     Rate and Rhythm: Normal rate and regular rhythm.     Heart sounds: No murmur  heard.   No friction rub. No gallop.  Pulmonary:     Breath sounds: No stridor. No wheezing or rales.  Chest:     Chest wall: No tenderness.  Abdominal:     General: There is no distension.     Tenderness: There is no abdominal tenderness. There is no rebound.  Musculoskeletal:     Cervical back: Neck supple.     Comments: Tender right hip and lumbar spine  Lymphadenopathy:     Cervical: No cervical adenopathy.  Skin:    Findings: No erythema or rash.  Neurological:     Mental Status: She is alert and oriented to person, place, and time.     Motor: No abnormal muscle tone.     Coordination: Coordination normal.  Psychiatric:        Behavior: Behavior normal.    ED Results / Procedures / Treatments   Labs (all labs ordered are listed, but only abnormal results are displayed) Labs Reviewed - No data to display  EKG None  Radiology DG Lumbar Spine Complete  Result Date: 04/07/2021 CLINICAL DATA:  Low back pain, right hip pain EXAM: LUMBAR SPINE - COMPLETE 4+ VIEW COMPARISON:  None. FINDINGS: Five lumbar type vertebral bodies. No evidence of pars break. Vertebral body heights and alignment are maintained. There is disc space narrowing with endplate osteophytes at L5-S1. Lower lumbar facet hypertrophy. 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 Electronically Signed   By: 06/08/2021 M.D.   On: 04/07/2021 13:12   DG Hip Unilat W or Wo Pelvis 2-3 Views Right  Result Date: 04/07/2021 CLINICAL DATA:  Right hip pain EXAM: DG HIP (WITH OR WITHOUT PELVIS) 2-3V RIGHT COMPARISON:  None. FINDINGS: There is no evidence of hip fracture or dislocation. There is no significant degenerative change or other focal bone abnormality. IMPRESSION: Negative right hip radiographs. Electronically Signed   By: 06/08/2021   On: 04/07/2021 13:57    Procedures Procedures   Medications Ordered in ED Medications  methylPREDNISolone sodium succinate (SOLU-MEDROL) 125 mg/2 mL injection 125 mg (has  no administration in time range)  oxyCODONE-acetaminophen (PERCOCET/ROXICET) 5-325 MG per tablet 1 tablet (has no administration in time range)  HYDROmorphone (DILAUDID) injection 1 mg (1 mg Intramuscular Given 04/07/21 1138)    ED Course  I have reviewed the triage vital signs and the nursing notes.  Pertinent labs & imaging results that were available during my care of the patient were reviewed by me and considered in my medical decision making (see chart for details). Patient's plain films show some degenerative changes in her lumbar spine   MDM Rules/Calculators/A&P  Patient with sciatica pain in the right leg.  She is placed on prednisone and Percocet and will follow up with neurosurgery Final Clinical Impression(s) / ED Diagnoses Final diagnoses:  Sciatica of right side    Rx / DC Orders ED Discharge Orders          Ordered    predniSONE (DELTASONE) 10 MG tablet  Daily        04/07/21 1415    oxyCODONE-acetaminophen (PERCOCET) 5-325 MG tablet  Every 6 hours PRN        04/07/21 1416             Bethann Berkshire, MD 04/07/21 1420    Bethann Berkshire, MD 04/07/21 1421

## 2021-04-07 NOTE — Discharge Instructions (Addendum)
Follow-up with Dr. Yetta Barre or one of his assistants in the next few weeks.  Call for an appointment

## 2021-04-07 NOTE — ED Triage Notes (Signed)
Patient c/o right hip pain x 1 month and now the pain radiates into the right leg. Patient states that she was seen at [facility] and the prescriptions they ordered are not working anymore.

## 2022-01-03 ENCOUNTER — Emergency Department (HOSPITAL_COMMUNITY)
Admission: EM | Admit: 2022-01-03 | Discharge: 2022-01-03 | Disposition: A | Discharge status: Home / Self Care | Payer: Medicaid - Out of State | Attending: Emergency Medicine | Admitting: Emergency Medicine

## 2022-01-03 ENCOUNTER — Encounter (HOSPITAL_COMMUNITY): Payer: Self-pay

## 2022-01-03 ENCOUNTER — Emergency Department (HOSPITAL_COMMUNITY): Payer: Medicaid - Out of State

## 2022-01-03 DIAGNOSIS — R079 Chest pain, unspecified: Secondary | ICD-10-CM

## 2022-01-03 DIAGNOSIS — F17210 Nicotine dependence, cigarettes, uncomplicated: Secondary | ICD-10-CM | POA: Diagnosis not present

## 2022-01-03 DIAGNOSIS — R110 Nausea: Secondary | ICD-10-CM | POA: Insufficient documentation

## 2022-01-03 DIAGNOSIS — R0602 Shortness of breath: Secondary | ICD-10-CM | POA: Insufficient documentation

## 2022-01-03 DIAGNOSIS — R7401 Elevation of levels of liver transaminase levels: Secondary | ICD-10-CM

## 2022-01-03 LAB — TROPONIN I (HIGH SENSITIVITY)
Troponin I (High Sensitivity): 10 ng/L (ref <18)
Troponin I (High Sensitivity): 9 ng/L (ref <18)

## 2022-01-03 LAB — COMPREHENSIVE METABOLIC PANEL
ALT: 156 U/L — ABNORMAL HIGH (ref 0–44)
AST: 210 U/L — ABNORMAL HIGH (ref 15–41)
Albumin: 4.2 g/dL (ref 3.5–5.0)
Alkaline Phosphatase: 59 U/L (ref 38–126)
Anion gap: 12 (ref 5–15)
BUN: 12 mg/dL (ref 6–20)
CO2: 24 mmol/L (ref 22–32)
Calcium: 10.2 mg/dL (ref 8.9–10.3)
Chloride: 100 mmol/L (ref 98–111)
Creatinine, Ser: 0.88 mg/dL (ref 0.44–1.00)
GFR, Estimated: >60 mL/min (ref >60)
Glucose, Bld: 80 mg/dL (ref 70–99)
Potassium: 3.7 mmol/L (ref 3.5–5.1)
Sodium: 136 mmol/L (ref 135–145)
Total Bilirubin: 0.5 mg/dL (ref 0.3–1.2)
Total Protein: 8.3 g/dL — ABNORMAL HIGH (ref 6.5–8.1)

## 2022-01-03 LAB — CBC
HCT: 41.5 % (ref 36.0–46.0)
Hemoglobin: 14.6 g/dL (ref 12.0–15.0)
MCH: 37.2 pg — ABNORMAL HIGH (ref 26.0–34.0)
MCHC: 35.2 g/dL (ref 30.0–36.0)
MCV: 105.9 fL — ABNORMAL HIGH (ref 80.0–100.0)
Platelets: 216 10*3/uL (ref 150–400)
RBC: 3.92 MIL/uL (ref 3.87–5.11)
RDW: 12.3 % (ref 11.5–15.5)
WBC: 8.3 10*3/uL (ref 4.0–10.5)
nRBC: 0 % (ref 0.0–0.2)

## 2022-01-03 LAB — LIPASE, BLOOD: Lipase: 30 U/L (ref 11–51)

## 2022-01-03 MED ORDER — OXYCODONE-ACETAMINOPHEN 5-325 MG PO TABS
1.0000 | ORAL_TABLET | Freq: Once | ORAL | Status: AC
  Administered 2022-01-03: 1 via ORAL
  Filled 2022-01-03: qty 1

## 2022-01-03 NOTE — ED Provider Triage Note (Signed)
Emergency Medicine Provider Triage Evaluation Note ? ?Kaitlin Carpenter , a 51 y.o. female  was evaluated in triage.  Pt complains of intermittent chest pain with shortness of breath x 4 days. Occasional nausea. ? ?Review of Systems  ?Positive: Chest pain, shortness of breath ?Negative: Abdominal pain ? ?Physical Exam  ?BP 125/89 (BP Location: Right Arm)   Pulse 81   Temp 98.1 ?F (36.7 ?C) (Oral)   Resp 14   Ht 5' 3.5" (1.613 m)   Wt 59 kg   LMP 01/05/2021 (Within Days) Comment: Pt has not been active in a year and pt states she's not getting periods anymore.  SpO2 97%   BMI 22.67 kg/m?  ?Gen:   Awake, no distress   ?Resp:  Normal effort  ?MSK:   Moves extremities without difficulty  ?Other:  Abdomen soft, non-tender. Lungs CTA bilaterally. ? ?Medical Decision Making  ?Medically screening exam initiated at 5:33 PM.  Appropriate orders placed.  Kaitlin Carpenter was informed that the remainder of the evaluation will be completed by another provider, this initial triage assessment does not replace that evaluation, and the importance of remaining in the ED until their evaluation is complete. ? ? ?  ?Felicie Morn, NP ?01/03/22 2021 ? ?

## 2022-01-03 NOTE — ED Triage Notes (Signed)
Pt reports left/central chest pain, shob, and tingling down left arm and neck pain X4 days.  ? ?Pt reports nerve damage on left side from accident.  ? ?Pt report N/V but also states she has a ulcer.  ? ?A/oX4 ?Ambulatory in triage  ?

## 2022-01-03 NOTE — Discharge Instructions (Addendum)
We recommended obtaining some additional testing and further observation.  If you have any recurrence of your pain or increased difficulty breathing or you change your mind, please come back to ER for reassessment.  Strongly recommend following up with your regular doctor to discuss your alcohol abuse and your symptoms from today. ?

## 2022-01-03 NOTE — ED Provider Notes (Signed)
?[facility]-EMER[facility] DEPT ?Provider Note ? ? ?CSN: [id number] ?Arrival date & time: 01/03/22  1641 ? ?  ? ?History ? ?Chief Complaint  ?Patient presents with  ? Chest Pain  ? Shortness of Breath  ? ? ?Kaitlin Carpenter is a 51 y.o. female.  Presented to the emergency department with concern for chest pain shortness of breath.  States that she has been having the symptoms for about 4 days.  Symptoms are relatively constant, not associated with exertion.  Occasional nausea but no vomiting.  No abdominal pain.  Pain is up to 5 or 6 out of 10 in severity, aching, moderate. ? ?Denies prior history of heart attack, heart disease, DVT/PE.  She does smoke cigarettes.  She states that she has been going through her divorce and has been drinking more alcohol lately, drinks a bottle of wine and sixpack of beer on a typical day. ? ?HPI ? ?  ? ?Home Medications ?Prior to Admission medications   ?Medication Sig Start Date End Date Taking? Authorizing Provider  ?Aspirin-Salicylamide-Caffeine (BC HEADACHE POWDER PO) Take 1-2 packets by mouth 2 (two) times daily as needed (headaches).    [provider]  ?HYDROcodone-acetaminophen (NORCO/VICODIN) 5-325 MG tablet Take 1 tablet by mouth every 6 (six) hours as needed for moderate pain or severe pain. 03/28/21   Mardella Layman, MD  ?meloxicam (MOBIC) 15 MG tablet Take 1 tablet (15 mg total) by mouth daily. 03/28/21   Mardella Layman, MD  ?oxyCODONE-acetaminophen (PERCOCET) 5-325 MG tablet Take 1 tablet by mouth every 6 (six) hours as needed. 04/07/21   Bethann Berkshire, MD  ?predniSONE (DELTASONE) 10 MG tablet Take 2 tablets (20 mg total) by mouth daily. 04/07/21   Bethann Berkshire, MD  ?   ? ?Allergies    ?Patient has no known allergies.   ? ?Review of Systems   ?Review of Systems  ?Constitutional:  Negative for chills and fever.  ?HENT:  Negative for ear pain and sore throat.   ?Eyes:  Negative for pain and visual disturbance.  ?Respiratory:  Positive for shortness of  breath. Negative for cough.   ?Cardiovascular:  Positive for chest pain. Negative for palpitations.  ?Gastrointestinal:  Negative for abdominal pain and vomiting.  ?Genitourinary:  Negative for dysuria and hematuria.  ?Musculoskeletal:  Negative for arthralgias and back pain.  ?Skin:  Negative for color change and rash.  ?Neurological:  Negative for seizures and syncope.  ?All other systems reviewed and are negative. ? ?Physical Exam ?Updated Vital Signs ?BP 129/83   Pulse 89   Temp 98.7 ?F (37.1 ?C) (Oral)   Resp 17   Ht 5' 3.5" (1.613 m)   Wt 59 kg   LMP 01/05/2021 (Within Days) Comment: Pt has not been active in a year and pt states she's not getting periods anymore.  SpO2 96%   BMI 22.67 kg/m?  ?Physical Exam ?Vitals and nursing note reviewed.  ?Constitutional:   ?   General: She is not in acute distress. ?   Appearance: She is well-developed.  ?HENT:  ?   Head: Normocephalic and atraumatic.  ?Eyes:  ?   Conjunctiva/sclera: Conjunctivae normal.  ?Cardiovascular:  ?   Rate and Rhythm: Normal rate and regular rhythm.  ?   Heart sounds: No murmur heard. ?Pulmonary:  ?   Effort: Pulmonary effort is normal. No respiratory distress.  ?   Breath sounds: Normal breath sounds.  ?Abdominal:  ?   Palpations: Abdomen is soft.  ?   Tenderness:  There is no abdominal tenderness.  ?Musculoskeletal:     ?   General: No swelling.  ?   Cervical back: Neck supple.  ?Skin: ?   General: Skin is warm and dry.  ?   Capillary Refill: Capillary refill takes less than 2 seconds.  ?Neurological:  ?   Mental Status: She is alert.  ?Psychiatric:     ?   Mood and Affect: Mood normal.  ? ? ?ED Results / Procedures / Treatments   ?Labs ?(all labs ordered are listed, but only abnormal results are displayed) ?Labs Reviewed  ?CBC - Abnormal; Notable for the following components:  ?    Result Value  ? MCV 105.9 (*)   ? MCH 37.2 (*)   ? All other components within normal limits  ?COMPREHENSIVE METABOLIC PANEL - Abnormal; Notable for the  following components:  ? Total Protein 8.3 (*)   ? AST 210 (*)   ? ALT 156 (*)   ? All other components within normal limits  ?LIPASE, BLOOD  ?D-DIMER, QUANTITATIVE  ?TROPONIN I (HIGH SENSITIVITY)  ?TROPONIN I (HIGH SENSITIVITY)  ? ? ?EKG ?EKG Interpretation ? ?Date/Time:  Wednesday January 03 2022 16:49:53 EDT ?Ventricular Rate:  83 ?PR Interval:  135 ?QRS Duration: 83 ?QT Interval:  375 ?QTC Calculation: 441 ?R Axis:   80 ?Text Interpretation: Sinus rhythm Confirmed by Marianna Fuss ([id number]) on 01/03/2022 8:14:43 PM ? ?Radiology ?DG Chest 2 View ? ?Result Date: 01/03/2022 ?CLINICAL DATA:  Chest pain EXAM: CHEST - 2 VIEW COMPARISON:  01/27/2021 FINDINGS: Cardiac and mediastinal contours are within normal limits. No focal pulmonary opacity. No pleural effusion or pneumothorax. No acute osseous abnormality. IMPRESSION: No acute cardiopulmonary process. Electronically Signed   By: Wiliam Ke M.D.   On: 01/03/2022 18:35   ? ?Procedures ?Procedures  ? ? ?Medications Ordered in ED ?Medications  ?oxyCODONE-acetaminophen (PERCOCET/ROXICET) 5-325 MG per tablet 1 tablet (1 tablet Oral Given 01/03/22 2042)  ? ? ?ED Course/ Medical Decision Making/ A&P ?  ?                        ?Medical Decision Making ?Amount and/or Complexity of Data Reviewed ?Labs: ordered. ?Radiology: ordered. ? ?Risk ?Prescription drug management. ? ? ?51 year old lady presenting to ER with concern for chest pain and shortness of breath.  When I evaluated her she appeared well in no distress with grossly stable vital signs.  EKG without acute ischemic change and troponin is within normal limits, low suspicion for ACS.  No anemia, CXR without evidence for pneumonia or pneumothorax.  I independently reviewed CXR.  Does smoke.  I advised checking D-dimer to more definitively rule out PE.  Patient states that she is unwilling to wait in the emergency room any longer for any additional blood draws and decided to leave before treatment was complete. ? ?Noted  elevated LFTs, discussed with patient, she does endorse alcohol abuse.  I spent greater than 5 minutes counseling patient on benefits of alcohol cessation.  Advise following up with PCP for further monitoring regarding elevation of LFTs and her alcohol abuse.  Provided outpatient resources for alcohol abuse. ? ? ? ? ? ? ? ? ?Final Clinical Impression(s) / ED Diagnoses ?Final diagnoses:  ?Chest pain, unspecified type  ?Transaminitis  ? ? ?Rx / DC Orders ?ED Discharge Orders   ? ? None  ? ?  ? ? ?  ?Milagros Loll, MD ?01/03/22 2058 ? ?

## 2022-01-03 NOTE — ED Notes (Signed)
Pt states she needs to leave, and cannot wait for labwork to be drawn ?

## 2022-01-03 NOTE — ED Notes (Signed)
Patient verbalizes understanding of discharge instructions. Opportunity for questioning and answers were provided. Armband removed by staff, pt discharged from ED. Ambulated out to lobby  

## 2022-02-28 IMAGING — CR DG LUMBAR SPINE COMPLETE 4+V
5 series · 5 of 5 positions shown · non-contrast
Comparison: None.

CLINICAL DATA: Low back pain, right hip pain

EXAM:
LUMBAR SPINE - COMPLETE 4+ VIEW

[t lumbar spine ap]
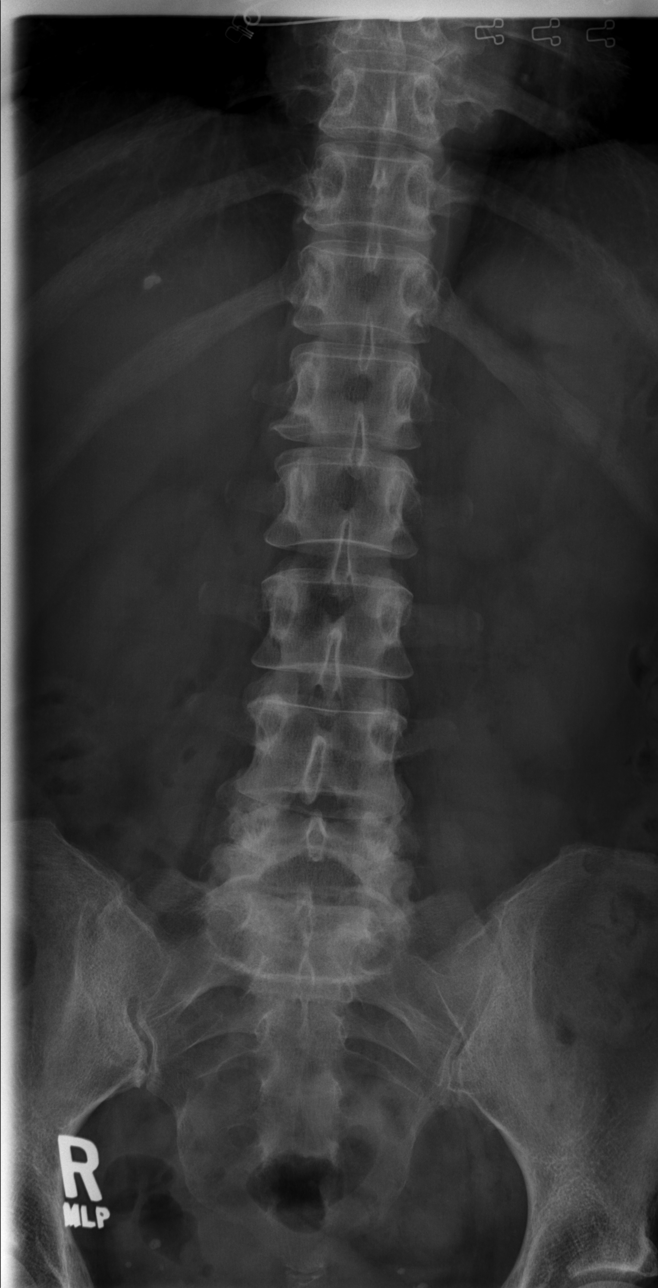

[t lumbar spine obl (1 of 2)]
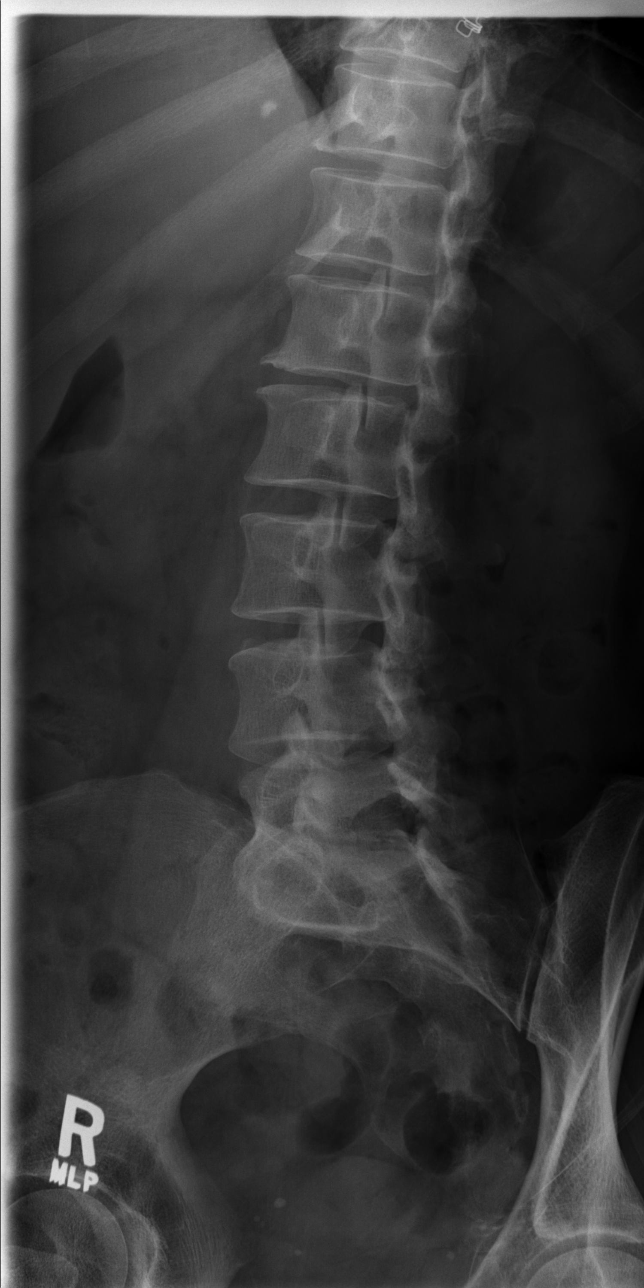

[t lumbar spine obl (2 of 2)]
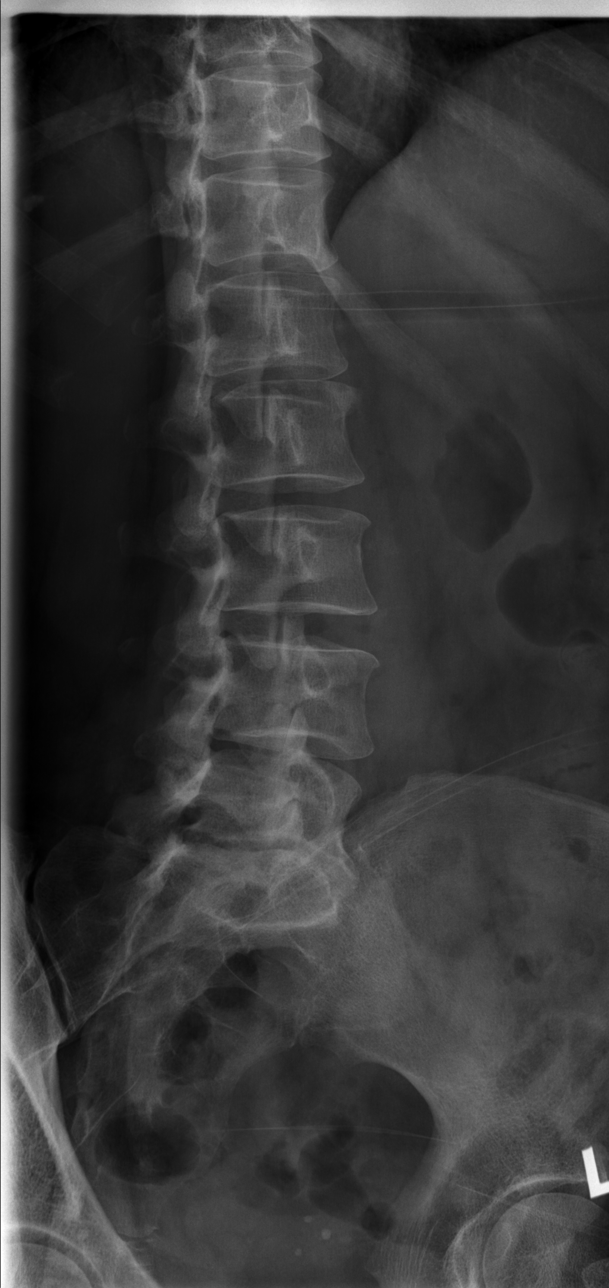

[t lumbar spine lat]
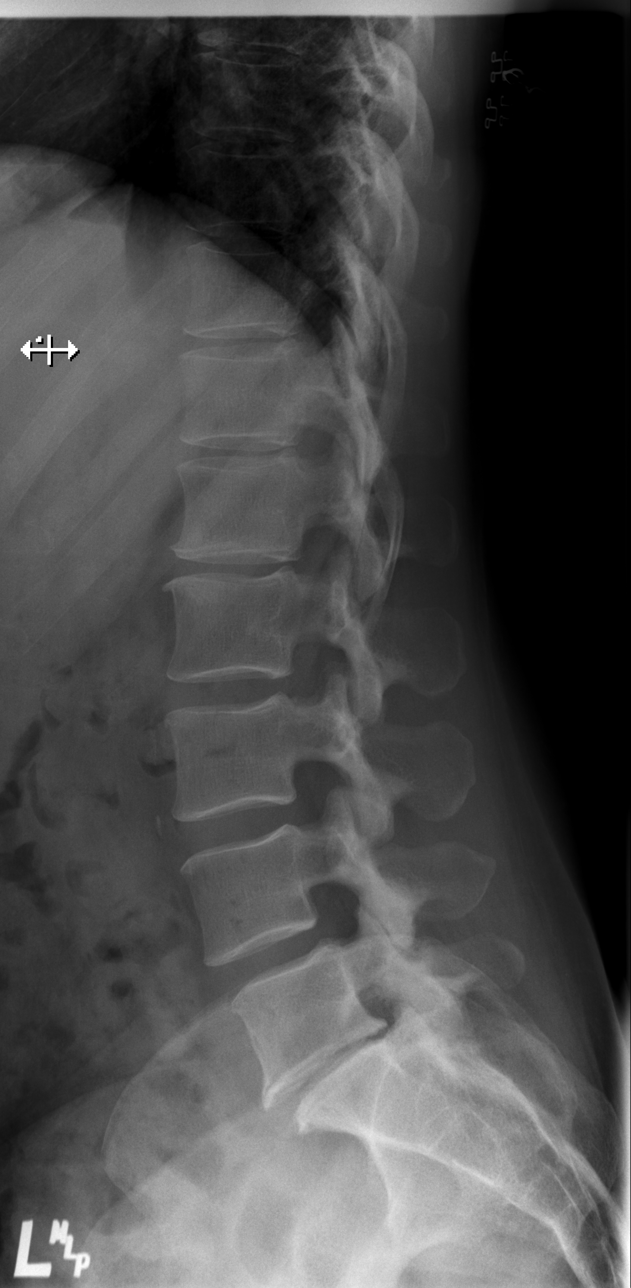

[t lumbar l-5 s-1 spot]
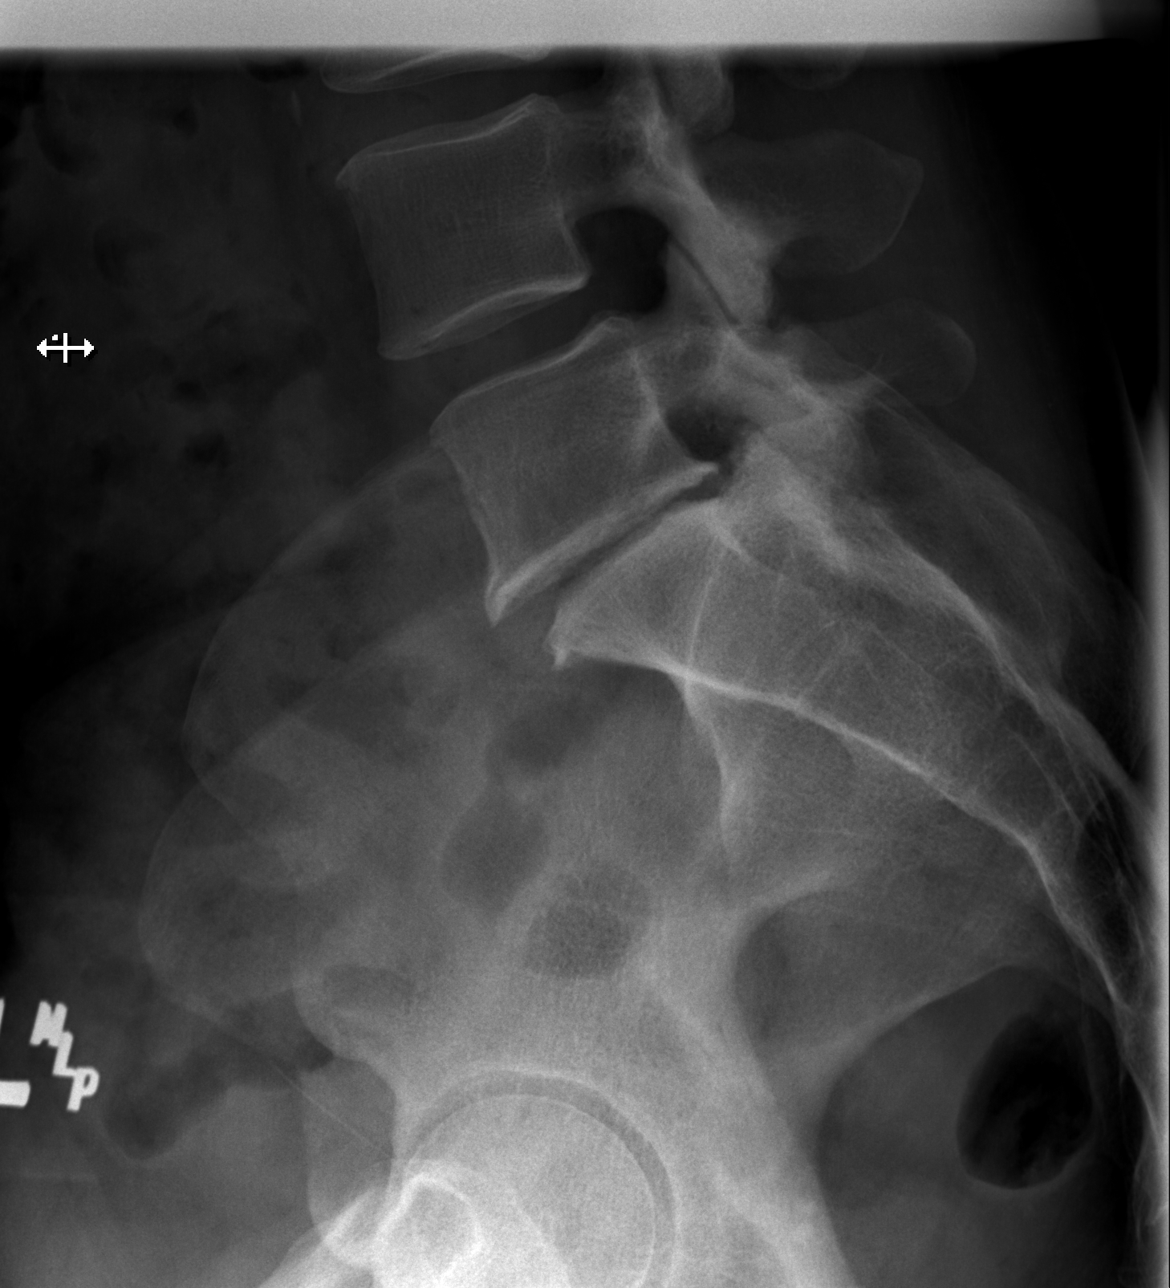

[5 of 5 positions shown; findings below may reference images not displayed]

FINDINGS: Five lumbar type vertebral bodies. No evidence of pars break.
Vertebral body heights and alignment are maintained. There is disc
space narrowing with endplate osteophytes at L5-S1. Lower lumbar
facet hypertrophy.
IMPRESSION: Disc and facet degeneration at L5-S1.

## 2022-04-12 ENCOUNTER — Emergency Department (HOSPITAL_COMMUNITY)
Admission: EM | Admit: 2022-04-12 | Discharge: 2022-04-12 | Discharge status: Against Medical Advice | Payer: Medicaid - Out of State | Attending: Emergency Medicine | Admitting: Emergency Medicine

## 2022-04-12 ENCOUNTER — Encounter (HOSPITAL_COMMUNITY): Payer: Self-pay

## 2022-04-12 ENCOUNTER — Other Ambulatory Visit: Payer: Self-pay

## 2022-04-12 DIAGNOSIS — Z5321 Procedure and treatment not carried out due to patient leaving prior to being seen by health care provider: Secondary | ICD-10-CM | POA: Insufficient documentation

## 2022-04-12 DIAGNOSIS — R0789 Other chest pain: Secondary | ICD-10-CM | POA: Diagnosis not present

## 2022-04-12 DIAGNOSIS — R1110 Vomiting, unspecified: Secondary | ICD-10-CM | POA: Insufficient documentation

## 2022-04-12 LAB — URINALYSIS, ROUTINE W REFLEX MICROSCOPIC
Bilirubin Urine: NEGATIVE
Glucose, UA: NEGATIVE mg/dL
Hgb urine dipstick: NEGATIVE
Ketones, ur: 5 mg/dL — AB
Nitrite: NEGATIVE
Protein, ur: 30 mg/dL — AB
Specific Gravity, Urine: 1.025 (ref 1.005–1.030)
WBC, UA: >50 WBC/hpf — ABNORMAL HIGH (ref 0–5)
pH: 5 (ref 5.0–8.0)

## 2022-04-12 NOTE — ED Triage Notes (Signed)
Pt presents to ED with complaints of vomiting x 5 days. Pt also c/o pain on left side of chest

## 2022-07-26 ENCOUNTER — Encounter (HOSPITAL_COMMUNITY): Payer: Self-pay | Admitting: Emergency Medicine

## 2022-07-26 ENCOUNTER — Emergency Department (HOSPITAL_COMMUNITY): Payer: Medicaid - Out of State

## 2022-07-26 ENCOUNTER — Other Ambulatory Visit: Payer: Self-pay

## 2022-07-26 ENCOUNTER — Emergency Department (HOSPITAL_COMMUNITY)
Admission: EM | Admit: 2022-07-26 | Discharge: 2022-07-26 | Disposition: A | Discharge status: Home / Self Care | Payer: Medicaid - Out of State | Attending: Emergency Medicine | Admitting: Emergency Medicine

## 2022-07-26 DIAGNOSIS — K529 Noninfective gastroenteritis and colitis, unspecified: Secondary | ICD-10-CM | POA: Diagnosis not present

## 2022-07-26 DIAGNOSIS — Z20822 Contact with and (suspected) exposure to covid-19: Secondary | ICD-10-CM | POA: Diagnosis not present

## 2022-07-26 DIAGNOSIS — K852 Alcohol induced acute pancreatitis without necrosis or infection: Secondary | ICD-10-CM | POA: Insufficient documentation

## 2022-07-26 DIAGNOSIS — R109 Unspecified abdominal pain: Secondary | ICD-10-CM | POA: Diagnosis present

## 2022-07-26 DIAGNOSIS — R059 Cough, unspecified: Secondary | ICD-10-CM | POA: Insufficient documentation

## 2022-07-26 LAB — COMPREHENSIVE METABOLIC PANEL
ALT: 132 U/L — ABNORMAL HIGH (ref 0–44)
AST: 191 U/L — ABNORMAL HIGH (ref 15–41)
Albumin: 4.8 g/dL (ref 3.5–5.0)
Alkaline Phosphatase: 73 U/L (ref 38–126)
Anion gap: 13 (ref 5–15)
BUN: 23 mg/dL — ABNORMAL HIGH (ref 6–20)
CO2: 28 mmol/L (ref 22–32)
Calcium: 9.7 mg/dL (ref 8.9–10.3)
Chloride: 95 mmol/L — ABNORMAL LOW (ref 98–111)
Creatinine, Ser: 0.67 mg/dL (ref 0.44–1.00)
GFR, Estimated: >60 mL/min (ref >60)
Glucose, Bld: 120 mg/dL — ABNORMAL HIGH (ref 70–99)
Potassium: 2.8 mmol/L — ABNORMAL LOW (ref 3.5–5.1)
Sodium: 136 mmol/L (ref 135–145)
Total Bilirubin: 1.2 mg/dL (ref 0.3–1.2)
Total Protein: 9.1 g/dL — ABNORMAL HIGH (ref 6.5–8.1)

## 2022-07-26 LAB — CBC WITH DIFFERENTIAL/PLATELET
Abs Immature Granulocytes: 0.03 10*3/uL (ref 0.00–0.07)
Basophils Absolute: 0 10*3/uL (ref 0.0–0.1)
Basophils Relative: 1 %
Eosinophils Absolute: 0 10*3/uL (ref 0.0–0.5)
Eosinophils Relative: 0 %
HCT: 40.9 % (ref 36.0–46.0)
Hemoglobin: 14.9 g/dL (ref 12.0–15.0)
Immature Granulocytes: 0 %
Lymphocytes Relative: 11 %
Lymphs Abs: 0.9 10*3/uL (ref 0.7–4.0)
MCH: 38.4 pg — ABNORMAL HIGH (ref 26.0–34.0)
MCHC: 36.4 g/dL — ABNORMAL HIGH (ref 30.0–36.0)
MCV: 105.4 fL — ABNORMAL HIGH (ref 80.0–100.0)
Monocytes Absolute: 0.4 10*3/uL (ref 0.1–1.0)
Monocytes Relative: 5 %
Neutro Abs: 7.2 10*3/uL (ref 1.7–7.7)
Neutrophils Relative %: 83 %
Platelets: 165 10*3/uL (ref 150–400)
RBC: 3.88 MIL/uL (ref 3.87–5.11)
RDW: 12.4 % (ref 11.5–15.5)
WBC: 8.6 10*3/uL (ref 4.0–10.5)
nRBC: 0 % (ref 0.0–0.2)

## 2022-07-26 LAB — TROPONIN I (HIGH SENSITIVITY)
Troponin I (High Sensitivity): 12 ng/L (ref <18)
Troponin I (High Sensitivity): 13 ng/L (ref <18)

## 2022-07-26 LAB — LIPASE, BLOOD: Lipase: 220 U/L — ABNORMAL HIGH (ref 11–51)

## 2022-07-26 LAB — RESP PANEL BY RT-PCR (FLU A&B, COVID) ARPGX2
Influenza A by PCR: NEGATIVE
Influenza B by PCR: NEGATIVE
SARS Coronavirus 2 by RT PCR: NEGATIVE

## 2022-07-26 MED ORDER — SODIUM CHLORIDE 0.9 % IV BOLUS
1500.0000 mL | Freq: Once | INTRAVENOUS | Status: AC
  Administered 2022-07-26: 1500 mL via INTRAVENOUS

## 2022-07-26 MED ORDER — ALUM & MAG HYDROXIDE-SIMETH 200-200-20 MG/5ML PO SUSP
30.0000 mL | Freq: Once | ORAL | Status: AC
Start: 2022-07-26 — End: 2022-07-26
  Administered 2022-07-26: 30 mL via ORAL
  Filled 2022-07-26: qty 30

## 2022-07-26 MED ORDER — POTASSIUM CHLORIDE 20 MEQ PO PACK
40.0000 meq | PACK | Freq: Once | ORAL | Status: AC
  Administered 2022-07-26: 40 meq via ORAL
  Filled 2022-07-26: qty 2

## 2022-07-26 MED ORDER — POTASSIUM CHLORIDE 10 MEQ/100ML IV SOLN
10.0000 meq | INTRAVENOUS | Status: AC
  Administered 2022-07-26 (×2): 10 meq via INTRAVENOUS
  Filled 2022-07-26 (×2): qty 100

## 2022-07-26 MED ORDER — IOHEXOL 300 MG/ML  SOLN
100.0000 mL | Freq: Once | INTRAMUSCULAR | Status: AC | PRN
  Administered 2022-07-26: 75 mL via INTRAVENOUS

## 2022-07-26 MED ORDER — ONDANSETRON HCL 4 MG/2ML IJ SOLN
4.0000 mg | Freq: Once | INTRAMUSCULAR | Status: AC
  Administered 2022-07-26: 4 mg via INTRAVENOUS
  Filled 2022-07-26: qty 2

## 2022-07-26 MED ORDER — OXYCODONE-ACETAMINOPHEN 5-325 MG PO TABS: 1.0000 | ORAL_TABLET | Freq: Four times a day (QID) | ORAL | 0 refills | Status: DC | PRN

## 2022-07-26 MED ORDER — LIDOCAINE VISCOUS HCL 2 % MT SOLN
15.0000 mL | Freq: Once | OROMUCOSAL | Status: AC
  Administered 2022-07-26: 15 mL via ORAL
  Filled 2022-07-26: qty 15

## 2022-07-26 MED ORDER — OXYCODONE-ACETAMINOPHEN 5-325 MG PO TABS
1.0000 | ORAL_TABLET | Freq: Once | ORAL | Status: AC
  Administered 2022-07-26: 1 via ORAL
  Filled 2022-07-26: qty 1

## 2022-07-26 MED ORDER — ONDANSETRON HCL 4 MG PO TABS
4.0000 mg | ORAL_TABLET | Freq: Four times a day (QID) | ORAL | 0 refills | Status: DC
Start: 2022-07-26 — End: 2022-12-21

## 2022-07-26 NOTE — Discharge Instructions (Addendum)
Recommend drinking plenty of fluids, please use the nausea and pain medication as needed, follow the guidelines above for pancreatitis eating plan follow-up with your PCP as soon as you are able for further evaluation.

## 2022-07-26 NOTE — ED Provider Notes (Signed)
[facility] EMERGENCY DEPARTMENT Provider Note   CSN: [id number] Arrival date & time: 07/26/22  1107     History  No chief complaint on file.   Kaitlin Carpenter is a 51 y.o. female with past medical history significant for alcohol use disorder who presents with concern for nausea, vomiting, cough intermittently for a week and a half, worse over the last 3 days, patient reports that she has not been able to tolerate eating anything or drinking anything, feels significantly dehydrated, she also endorses diarrhea, she denies significant fever, chills, she has had some chest pain that she reports is somewhat worse with exertion, but describes a dull aching chest pain that never goes away that has been going on for 3 to 4 months.  She denies worsening nausea, vomiting with episodes of chest pain, reports worsening with vomiting.  She denies any blood in emesis or in stool.  She denies recent or previous intra-abdominal surgeries, reports previous history of elevated liver enzymes secondary to alcohol abuse.  Patient reports that she tried to have a drink last night but was unable to tolerate and threw it up.  At this time she reports a dull aching chest pain, as well as a sharp abdominal pain.  HPI     Home Medications Prior to Admission medications   Medication Sig Start Date End Date Taking? Authorizing Provider  ondansetron (ZOFRAN) 4 MG tablet Take 1 tablet (4 mg total) by mouth every 6 (six) hours. 07/26/22  Yes Takiesha Mcdevitt H, PA-C  oxyCODONE-acetaminophen (PERCOCET/ROXICET) 5-325 MG tablet Take 1 tablet by mouth every 6 (six) hours as needed for severe pain. 07/26/22  Yes Avigayil Ton H, PA-C  Aspirin-Salicylamide-Caffeine (BC HEADACHE POWDER PO) Take 1-2 packets by mouth 2 (two) times daily as needed (headaches).    [provider]  HYDROcodone-acetaminophen (NORCO/VICODIN) 5-325 MG tablet Take 1 tablet by mouth every 6 (six) hours as needed for moderate pain or  severe pain. 03/28/21   Mardella Layman, MD  meloxicam (MOBIC) 15 MG tablet Take 1 tablet (15 mg total) by mouth daily. 03/28/21   Mardella Layman, MD  oxyCODONE-acetaminophen (PERCOCET) 5-325 MG tablet Take 1 tablet by mouth every 6 (six) hours as needed. 04/07/21   Bethann Berkshire, MD  predniSONE (DELTASONE) 10 MG tablet Take 2 tablets (20 mg total) by mouth daily. 04/07/21   Bethann Berkshire, MD      Allergies    Patient has no known allergies.    Review of Systems   Review of Systems  Respiratory:  Positive for shortness of breath.   Cardiovascular:  Positive for chest pain.  Gastrointestinal:  Positive for abdominal pain.  All other systems reviewed and are negative.   Physical Exam Updated Vital Signs BP (!) 162/98 (BP Location: Right Arm)   Pulse 80   Temp 98.1 F (36.7 C) (Oral)   Resp 16   Ht 5' 2.5" (1.588 m)   Wt 52.6 kg   LMP 01/05/2021 (Within Days) Comment: Pt has not been active in a year and pt states she's not getting periods anymore.  SpO2 100%   BMI 20.88 kg/m  Physical Exam Vitals and nursing note reviewed.  Constitutional:      General: She is not in acute distress.    Appearance: Normal appearance.  HENT:     Head: Normocephalic and atraumatic.     Mouth/Throat:     Mouth: Mucous membranes are dry.  Eyes:     General:  Right eye: No discharge.        Left eye: No discharge.  Cardiovascular:     Rate and Rhythm: Normal rate and regular rhythm.     Heart sounds: No murmur heard.    No friction rub. No gallop.  Pulmonary:     Effort: Pulmonary effort is normal.     Breath sounds: Normal breath sounds.  Abdominal:     General: Bowel sounds are normal.     Palpations: Abdomen is soft.     Comments: Focally tender epigastric region, some guarding, no rebound, rigidity  Skin:    General: Skin is warm and dry.     Capillary Refill: Capillary refill takes less than 2 seconds.  Neurological:     Mental Status: She is alert and oriented to person, place,  and time.  Psychiatric:        Mood and Affect: Mood normal.        Behavior: Behavior normal.     ED Results / Procedures / Treatments   Labs (all labs ordered are listed, but only abnormal results are displayed) Labs Reviewed  CBC WITH DIFFERENTIAL/PLATELET - Abnormal; Notable for the following components:      Result Value   MCV 105.4 (*)    MCH 38.4 (*)    MCHC 36.4 (*)    All other components within normal limits  COMPREHENSIVE METABOLIC PANEL - Abnormal; Notable for the following components:   Potassium 2.8 (*)    Chloride 95 (*)    Glucose, Bld 120 (*)    BUN 23 (*)    Total Protein 9.1 (*)    AST 191 (*)    ALT 132 (*)    All other components within normal limits  LIPASE, BLOOD - Abnormal; Notable for the following components:   Lipase 220 (*)    All other components within normal limits  RESP PANEL BY RT-PCR (FLU A&B, COVID) ARPGX2  TROPONIN I (HIGH SENSITIVITY)  TROPONIN I (HIGH SENSITIVITY)    EKG None  Radiology CT ABDOMEN PELVIS W CONTRAST  Result Date: 07/26/2022 CLINICAL DATA:  Abdominal pain, acute nonlocalized. Emesis and cough x3 days. EXAM: CT ABDOMEN AND PELVIS WITH CONTRAST TECHNIQUE: Multidetector CT imaging of the abdomen and pelvis was performed using the standard protocol following bolus administration of intravenous contrast. RADIATION DOSE REDUCTION: This exam was performed according to the departmental dose-optimization program which includes automated exposure control, adjustment of the mA and/or kV according to patient size and/or use of iterative reconstruction technique. CONTRAST:  48mL OMNIPAQUE IOHEXOL 300 MG/ML  SOLN COMPARISON:  CT abdomen pelvis July 19, 2018. FINDINGS: Lower chest: 4 mm subpleural right lower lobe pulmonary nodule on image 14/6. Hepatobiliary: Hepatomegaly. Diffuse hepatic steatosis. No suspicious hepatic lesion. Gallbladder is unremarkable. No biliary ductal dilation. Pancreas: Hypodense prominence of the pancreatic  head with adjacent fluid centered in the pancreaticoduodenal groove. No pancreatic ductal dilation. No focal fluid collection. Spleen: No splenomegaly or focal splenic lesion. Adrenals/Urinary Tract: Bilateral adrenal glands appear normal. No hydronephrosis. Kidneys demonstrate symmetric enhancement and excretion of contrast material. Nonobstructive punctate right lower pole renal stone. Urinary bladder is unremarkable for degree of distension. Stomach/Bowel: Stomach is unremarkable for degree of distension. Prominent fluid-filled loops of small bowel in the left hemiabdomen. No pathologic dilation of large or small bowel. Normal appendix and terminal ileum. Vascular/Lymphatic: Aortic atherosclerosis. No pathologically enlarged abdominal or pelvic lymph nodes. Reproductive: Uterus and bilateral adnexa are unremarkable in CT appearance. Other: No walled off fluid  collections.  No pneumoperitoneum. Musculoskeletal: Multilevel degenerative changes spine. No acute osseous abnormality. IMPRESSION: 1. Hypodense prominence of the pancreatic head with adjacent fluid centered in the pancreaticoduodenal groove, favored to reflect acute interstitial pancreatitis. Correlate with serum lipase. 2. Prominent fluid-filled loops of small bowel in the left hemiabdomen, which may reflect enteritis. 3. Hepatomegaly with diffuse hepatic steatosis. 4. Nonobstructive punctate right lower pole renal stone. 5. 4 mm subpleural right lower lobe pulmonary nodule. Per Fleischner Society Guidelines, no routine follow-up imaging is recommended. These guidelines do not apply to immunocompromised patients and patients with cancer. Follow up in patients with significant comorbidities as clinically warranted. For lung cancer screening, adhere to Lung-RADS guidelines. Reference: Radiology. 2017; 284(1):228-43. 6.  Aortic Atherosclerosis (ICD10-I70.0). Electronically Signed   By: Maudry Mayhew M.D.   On: 07/26/2022 14:53   DG Chest 2 View  Result  Date: 07/26/2022 CLINICAL DATA:  Cough and vomiting EXAM: CHEST - 2 VIEW COMPARISON:  Chest 01/03/2022 FINDINGS: The heart size and mediastinal contours are within normal limits. Both lungs are clear. The visualized skeletal structures are unremarkable. IMPRESSION: No active cardiopulmonary disease. Electronically Signed   By: Marlan Palau M.D.   On: 07/26/2022 12:11    Procedures Procedures    Medications Ordered in ED Medications  sodium chloride 0.9 % bolus 1,500 mL (0 mLs Intravenous Stopped 07/26/22 1625)  potassium chloride (KLOR-CON) packet 40 mEq (40 mEq Oral Given 07/26/22 1351)  potassium chloride 10 mEq in 100 mL IVPB (0 mEq Intravenous Stopped 07/26/22 1625)  ondansetron (ZOFRAN) injection 4 mg (4 mg Intravenous Given 07/26/22 1351)  alum & mag hydroxide-simeth (MAALOX/MYLANTA) 200-200-20 MG/5ML suspension 30 mL (30 mLs Oral Given 07/26/22 1356)    And  lidocaine (XYLOCAINE) 2 % viscous mouth solution 15 mL (15 mLs Oral Given 07/26/22 1356)  iohexol (OMNIPAQUE) 300 MG/ML solution 100 mL (75 mLs Intravenous Contrast Given 07/26/22 1431)  ondansetron (ZOFRAN) injection 4 mg (4 mg Intravenous Given 07/26/22 1714)  oxyCODONE-acetaminophen (PERCOCET/ROXICET) 5-325 MG per tablet 1 tablet (1 tablet Oral Given 07/26/22 1713)    ED Course/ Medical Decision Making/ A&P                           Medical Decision Making Amount and/or Complexity of Data Reviewed Labs: ordered. Radiology: ordered.  Risk Prescription drug management.   This patient is a 51 y.o. female who presents to the ED for concern of abdominal pain, nausea, vomiting, diarrhea, this involves an extensive number of treatment options, and is a complaint that carries with it a high risk of complications and morbidity. The emergent differential diagnosis prior to evaluation includes, but is not limited to, esophagitis, gastritis, peptic ulcer disease, esophageal rupture, gastric rupture, Boerhaave's, Mallory-Weiss,  pancreatitis, cholecystitis, cholangitis, acute mesenteric ischemia, atypical chest pain or ACS, lower lobar pneumonia, versus enteritis, gastroenteritis, colitis, or other infectious diarrhea versus other.   This is not an exhaustive differential.   Past Medical History / Co-morbidities / Social History: alcohol use disorder   Additional history: Chart reviewed. Pertinent results include: Reviewed lab work, imaging from previous emergency room visits  Physical Exam: Physical exam performed. The pertinent findings include: Patient with some tenderness to the epigastric region with some guarding but without rebound, rigidity, improved after administered, she appears dry on physical exam, otherwise no acute distress, no significant lower abdominal tenderness to palpation  Lab Tests: I ordered, and personally interpreted labs.  The pertinent results include: RVP negative for  COVID, flu, lipase elevated suggestive of early pancreatitis, troponin negative, CBC unremarkable, no clinically significant leukocytosis or anemia, CMP notable for hypokalemia, minimally elevated BUN, as well as chronically elevated liver enzymes, favor some mild dehydration, hypokalemia secondary to vomiting   Imaging Studies: I ordered imaging studies including CT abdomen pelvis with contrast, plain film chest x-ray. I independently visualized and interpreted imaging which showed some hypodensity around the pancreatic head consistent with pancreatitis, as well as evidence of enteritis no bowel perforation, diverticulitis. I agree with the radiologist interpretation.   Cardiac Monitoring:  The patient was maintained on a cardiac monitor.  My attending physician Dr. Estell Harpin viewed and interpreted the cardiac monitored which showed an underlying rhythm of: NSR. I agree with this interpretation.   Medications: I ordered medication including potassium, Zofran, Maalox, Mylanta, viscous lidocaine, Percocet for pain, nausea,  hypokalemia. Reevaluation of the patient after these medicines showed that the patient improved. I have reviewed the patients home medicines and have made adjustments as needed.   Disposition: After consideration of the diagnostic results and the patients response to treatment, I feel that patient stable for discharge after tolerating p.o with mild pancreatitis, considered admission based on her initial presentation and  Significant pain, however she is able to go home tolerate p.o. at this time and her vital signs are stable other than hypertension, blood pressure 160/98, fever secondary to pain, I think that she is reasonably stable for discharge with sensitive return precautions, PCP reevaluation for her pancreatitis, encouraged discontinuation of any alcohol use, plenty of fluids, will discharge with Zofran, and a few days of percocet  emergency department workup does not suggest an emergent condition requiring admission or immediate intervention beyond what has been performed at this time. The plan is: as above. The patient is safe for discharge and has been instructed to return immediately for worsening symptoms, change in symptoms or any other concerns.  I discussed this case with my attending physician Dr. Posey Rea who cosigned this note including patient's presenting symptoms, physical exam, and planned diagnostics and interventions. Attending physician stated agreement with plan or made changes to plan which were implemented.    Final Clinical Impression(s) / ED Diagnoses Final diagnoses:  Enteritis  Alcohol-induced acute pancreatitis without infection or necrosis    Rx / DC Orders ED Discharge Orders          Ordered    ondansetron (ZOFRAN) 4 MG tablet  Every 6 hours        07/26/22 1714    oxyCODONE-acetaminophen (PERCOCET/ROXICET) 5-325 MG tablet  Every 6 hours PRN        07/26/22 1714              Brent Noto, Harrel Carina, PA-C 07/26/22 1803    Bethann Berkshire,  MD 07/29/22 1106

## 2022-07-26 NOTE — ED Triage Notes (Signed)
Patient c/o emesis and cough x 3 days.  Patient also endorses dizziness with movement.  Patient drinking gatorade in triage.

## 2022-07-26 NOTE — ED Notes (Signed)
Patient transported to CT 

## 2022-09-06 ENCOUNTER — Emergency Department (HOSPITAL_COMMUNITY): Payer: Medicaid - Out of State

## 2022-09-06 ENCOUNTER — Other Ambulatory Visit: Payer: Self-pay

## 2022-09-06 ENCOUNTER — Emergency Department (HOSPITAL_COMMUNITY)
Admission: EM | Admit: 2022-09-06 | Discharge: 2022-09-06 | Disposition: A | Discharge status: Home / Self Care | Payer: Medicaid - Out of State | Attending: Emergency Medicine | Admitting: Emergency Medicine

## 2022-09-06 DIAGNOSIS — R1084 Generalized abdominal pain: Secondary | ICD-10-CM | POA: Diagnosis not present

## 2022-09-06 DIAGNOSIS — R197 Diarrhea, unspecified: Secondary | ICD-10-CM | POA: Diagnosis not present

## 2022-09-06 DIAGNOSIS — R9431 Abnormal electrocardiogram [ECG] [EKG]: Secondary | ICD-10-CM | POA: Diagnosis not present

## 2022-09-06 DIAGNOSIS — R109 Unspecified abdominal pain: Secondary | ICD-10-CM | POA: Diagnosis present

## 2022-09-06 DIAGNOSIS — R112 Nausea with vomiting, unspecified: Secondary | ICD-10-CM | POA: Insufficient documentation

## 2022-09-06 LAB — URINALYSIS, ROUTINE W REFLEX MICROSCOPIC
Bilirubin Urine: NEGATIVE
Glucose, UA: NEGATIVE mg/dL
Hgb urine dipstick: NEGATIVE
Ketones, ur: NEGATIVE mg/dL
Nitrite: NEGATIVE
Protein, ur: >=300 mg/dL — AB
Specific Gravity, Urine: 1.02 (ref 1.005–1.030)
WBC, UA: >50 WBC/hpf — ABNORMAL HIGH (ref 0–5)
pH: 7 (ref 5.0–8.0)

## 2022-09-06 LAB — COMPREHENSIVE METABOLIC PANEL
ALT: 110 U/L — ABNORMAL HIGH (ref 0–44)
AST: 150 U/L — ABNORMAL HIGH (ref 15–41)
Albumin: 4.2 g/dL (ref 3.5–5.0)
Alkaline Phosphatase: 58 U/L (ref 38–126)
Anion gap: 11 (ref 5–15)
BUN: 16 mg/dL (ref 6–20)
CO2: 27 mmol/L (ref 22–32)
Calcium: 9 mg/dL (ref 8.9–10.3)
Chloride: 105 mmol/L (ref 98–111)
Creatinine, Ser: 0.72 mg/dL (ref 0.44–1.00)
GFR, Estimated: >60 mL/min (ref >60)
Glucose, Bld: 125 mg/dL — ABNORMAL HIGH (ref 70–99)
Potassium: 3.9 mmol/L (ref 3.5–5.1)
Sodium: 143 mmol/L (ref 135–145)
Total Bilirubin: 0.5 mg/dL (ref 0.3–1.2)
Total Protein: 8.3 g/dL — ABNORMAL HIGH (ref 6.5–8.1)

## 2022-09-06 LAB — LIPASE, BLOOD: Lipase: 37 U/L (ref 11–51)

## 2022-09-06 LAB — CBC
HCT: 40.2 % (ref 36.0–46.0)
Hemoglobin: 14 g/dL (ref 12.0–15.0)
MCH: 38.8 pg — ABNORMAL HIGH (ref 26.0–34.0)
MCHC: 34.8 g/dL (ref 30.0–36.0)
MCV: 111.4 fL — ABNORMAL HIGH (ref 80.0–100.0)
Platelets: 253 10*3/uL (ref 150–400)
RBC: 3.61 MIL/uL — ABNORMAL LOW (ref 3.87–5.11)
RDW: 12.9 % (ref 11.5–15.5)
WBC: 7.5 10*3/uL (ref 4.0–10.5)
nRBC: 0 % (ref 0.0–0.2)

## 2022-09-06 MED ORDER — IOHEXOL 9 MG/ML PO SOLN
ORAL | Status: AC
  Filled 2022-09-06: qty 1000

## 2022-09-06 MED ORDER — PROCHLORPERAZINE EDISYLATE 10 MG/2ML IJ SOLN
10.0000 mg | Freq: Once | INTRAMUSCULAR | Status: AC
  Administered 2022-09-06: 10 mg via INTRAVENOUS
  Filled 2022-09-06: qty 2

## 2022-09-06 MED ORDER — SODIUM CHLORIDE 0.9 % IV BOLUS
1000.0000 mL | Freq: Once | INTRAVENOUS | Status: AC
  Administered 2022-09-06: 1000 mL via INTRAVENOUS

## 2022-09-06 MED ORDER — IOHEXOL 300 MG/ML  SOLN: 100.0000 mL | Freq: Once | INTRAMUSCULAR | Status: DC | PRN

## 2022-09-06 MED ORDER — KETOROLAC TROMETHAMINE 15 MG/ML IJ SOLN
15.0000 mg | Freq: Once | INTRAMUSCULAR | Status: AC
  Administered 2022-09-06: 15 mg via INTRAVENOUS
  Filled 2022-09-06: qty 1

## 2022-09-06 MED ORDER — ONDANSETRON HCL 4 MG/2ML IJ SOLN
4.0000 mg | Freq: Once | INTRAMUSCULAR | Status: AC
  Administered 2022-09-06: 4 mg via INTRAVENOUS
  Filled 2022-09-06: qty 2

## 2022-09-06 MED ORDER — ONDANSETRON 4 MG PO TBDP: 4.0000 mg | ORAL_TABLET | Freq: Three times a day (TID) | ORAL | 0 refills | Status: DC | PRN

## 2022-09-06 MED ORDER — MORPHINE SULFATE (PF) 4 MG/ML IV SOLN
4.0000 mg | Freq: Once | INTRAVENOUS | Status: AC
  Administered 2022-09-06: 4 mg via INTRAVENOUS
  Filled 2022-09-06: qty 1

## 2022-09-06 NOTE — ED Provider Triage Note (Signed)
Emergency Medicine Provider Triage Evaluation Note  Kaitlin Carpenter , a 51 y.o. female  was evaluated in triage.  Pt complains of abdominal pain ongoing for 4 to 5 days.  Nausea vomiting.  Says it feels like her pancreatitis.  She does drink alcohol, last had a "a couple of beers" the day before symptoms started..  Review of Systems  Positive: Nausea, vomiting, abdominal pain Negative: Fevers  Physical Exam  BP (!) 176/106   Pulse 85   Temp 98.3 F (36.8 C) (Oral)   Resp 20   Ht 5\' 3"  (1.6 m)   Wt 52.6 kg   LMP 01/05/2021 (Within Days) Comment: Pt has not been active in a year and pt states she's not getting periods anymore.  SpO2 99%   BMI 20.54 kg/m  Gen:   Awake, no distress   Resp:  Normal effort  MSK:   Moves extremities without difficulty  Other:  Abdominal tenderness  Medical Decision Making  Medically screening exam initiated at 12:25 PM.  Appropriate orders placed.  Birdia Jaycox Bookwalter was informed that the remainder of the evaluation will be completed by another provider, this initial triage assessment does not replace that evaluation, and the importance of remaining in the ED until their evaluation is complete.  Abdominal pain workup including for acute or chronic pancreatitis.   Renda Rolls, MD 09/06/22 1226

## 2022-09-06 NOTE — ED Provider Notes (Signed)
[facility] EMERGENCY DEPARTMENT Provider Note   CSN: [id number] Arrival date & time: 09/06/22  1134     History Chief Complaint  Patient presents with   Abdominal Pain    Kaitlin Carpenter is a 51 y.o. female with hx of alcohol induced pancreatitis who presents to the ED with epigastric abdominal pain that has been constant and ongoing for 2 days.  Patient states that she has slowed down on drinking alcohol but did have a couple of beers at a christmas party prior to her symptoms starting. Since the abdominal pain started she has had intractable nausea, vomiting, and diarrhea. She states this abdominal pain feels similar to her previous pancreatitis flares in the past. Nothing is different about her pain today. She denies chest pain, shortness of breath, and urinary symptoms.    Abdominal Pain      Home Medications Prior to Admission medications   Medication Sig Start Date End Date Taking? Authorizing Provider  ondansetron (ZOFRAN-ODT) 4 MG disintegrating tablet Take 1 tablet (4 mg total) by mouth every 8 (eight) hours as needed. 09/06/22  Yes Meredeth Ide, Anatasia Tino M, PA-C  HYDROcodone-acetaminophen (NORCO/VICODIN) 5-325 MG tablet Take 1 tablet by mouth every 6 (six) hours as needed for moderate pain or severe pain. Patient not taking: Reported on 09/06/2022 03/28/21   Mardella Layman, MD  meloxicam (MOBIC) 15 MG tablet Take 1 tablet (15 mg total) by mouth daily. Patient not taking: Reported on 09/06/2022 03/28/21   Mardella Layman, MD  ondansetron (ZOFRAN) 4 MG tablet Take 1 tablet (4 mg total) by mouth every 6 (six) hours. Patient not taking: Reported on 09/06/2022 07/26/22   Prosperi, Christian H, PA-C  oxyCODONE-acetaminophen (PERCOCET) 5-325 MG tablet Take 1 tablet by mouth every 6 (six) hours as needed. Patient not taking: Reported on 09/06/2022 04/07/21   Bethann Berkshire, MD  oxyCODONE-acetaminophen (PERCOCET/ROXICET) 5-325 MG tablet Take 1 tablet by mouth every 6 (six) hours as needed for severe  pain. Patient not taking: Reported on 09/06/2022 07/26/22   Prosperi, Christian H, PA-C  predniSONE (DELTASONE) 10 MG tablet Take 2 tablets (20 mg total) by mouth daily. Patient not taking: Reported on 09/06/2022 04/07/21   Bethann Berkshire, MD      Allergies    Patient has no known allergies.    Review of Systems   Review of Systems  Gastrointestinal:  Positive for abdominal pain.  All other systems reviewed and are negative.   Physical Exam Updated Vital Signs BP (!) 173/104   Pulse 83   Temp 98.3 F (36.8 C) (Oral)   Resp 18   Ht 5\' 3"  (1.6 m)   Wt 52.6 kg   LMP 01/05/2021 (Within Days) Comment: Pt has not been active in a year and pt states she's not getting periods anymore.  SpO2 99%   BMI 20.54 kg/m  Physical Exam Vitals and nursing note reviewed.  Constitutional:      General: She is not in acute distress.    Appearance: Normal appearance.  HENT:     Head: Normocephalic and atraumatic.  Eyes:     General:        Right eye: No discharge.        Left eye: No discharge.  Cardiovascular:     Comments: Regular rate and rhythm.  S1/S2 are distinct without any evidence of murmur, rubs, or gallops.  Radial pulses are 2+ bilaterally.  Dorsalis pedis pulses are 2+ bilaterally.  No evidence of pedal edema. Pulmonary:  Comments: Clear to auscultation bilaterally.  Normal effort.  No respiratory distress.  No evidence of wheezes, rales, or rhonchi heard throughout. Abdominal:     General: Abdomen is flat. Bowel sounds are normal. There is no distension.     Tenderness: There is abdominal tenderness in the epigastric area. There is no guarding or rebound.  Musculoskeletal:        General: Normal range of motion.     Cervical back: Neck supple.  Skin:    General: Skin is warm and dry.     Findings: No rash.  Neurological:     General: No focal deficit present.     Mental Status: She is alert.  Psychiatric:        Mood and Affect: Mood normal.        Behavior: Behavior  normal.     ED Results / Procedures / Treatments   Labs (all labs ordered are listed, but only abnormal results are displayed) Labs Reviewed  COMPREHENSIVE METABOLIC PANEL - Abnormal; Notable for the following components:      Result Value   Glucose, Bld 125 (*)    Total Protein 8.3 (*)    AST 150 (*)    ALT 110 (*)    All other components within normal limits  CBC - Abnormal; Notable for the following components:   RBC 3.61 (*)    MCV 111.4 (*)    MCH 38.8 (*)    All other components within normal limits  URINALYSIS, ROUTINE W REFLEX MICROSCOPIC - Abnormal; Notable for the following components:   APPearance HAZY (*)    Protein, ur >=300 (*)    Leukocytes,Ua SMALL (*)    WBC, UA >50 (*)    Bacteria, UA RARE (*)    All other components within normal limits  LIPASE, BLOOD    EKG EKG Interpretation  Date/Time:  Thursday September 06 2022 12:14:26 EST Ventricular Rate:  84 PR Interval:  128 QRS Duration: 84 QT Interval:  400 QTC Calculation: 472 R Axis:   70 Text Interpretation: Normal sinus rhythm Nonspecific ST abnormality Abnormal ECG When compared with ECG of 26-Jul-2022 11:23, No significant change was found Confirmed by Alvester Chou [id number]) on 09/06/2022 12:19:30 PM  Radiology No results found.  Procedures Procedures    Medications Ordered in ED Medications  iohexol (OMNIPAQUE) 9 MG/ML oral solution (has no administration in time range)  iohexol (OMNIPAQUE) 9 MG/ML oral solution (has no administration in time range)  morphine (PF) 4 MG/ML injection 4 mg (4 mg Intravenous Given 09/06/22 1406)  ondansetron (ZOFRAN) injection 4 mg (4 mg Intravenous Given 09/06/22 1405)  sodium chloride 0.9 % bolus 1,000 mL (0 mLs Intravenous Stopped 09/06/22 1548)  ketorolac (TORADOL) 15 MG/ML injection 15 mg (15 mg Intravenous Given 09/06/22 1544)  prochlorperazine (COMPAZINE) injection 10 mg (10 mg Intravenous Given 09/06/22 1538)    ED Course/ Medical Decision Making/  A&P Clinical Course as of 09/06/22 1813  Thu Sep 06, 2022  1802 Patient wishing to go home.  She has not yet had her CT scan.  I instructed her that the CT scan would offer beneficial information on to why she might be hurting.  I also notified her that her lipase was normal.  Patient still wished to go home and declined CT at this time. [CF]  1805 CBC(!) No evidence of leukocytosis or anemia. [CF]  1805 Lipase, blood Normal. [CF]  1806 Comprehensive metabolic panel(!) There is evidence of transaminitis which I would like  to assess for CT but patient declined CT at this time.  No electrolyte abnormalities. [CF]    Clinical Course User Index [CF] Teressa Lower, PA-C                           Medical Decision Making LAVERE SHINSKY is a 51 y.o. female patient who presents to the ED with epigastric abdominal pain. Given her history and recent alcohol use, I am suspicious for pancreatitis.  Do not feel imaging is warranted at this time.  I will start fluids, antiemetics, and pain medicine.  Depending on what the labs show and her overall clinical course and may consider getting a CT abdomen pelvis.  Approximately a patient is in no acute distress at this time.   Amount and/or Complexity of Data Reviewed Labs: ordered. Decision-making details documented in ED Course. Radiology: ordered.  Risk Prescription drug management.   Final Clinical Impression(s) / ED Diagnoses Final diagnoses:  Generalized abdominal pain    Rx / DC Orders ED Discharge Orders          Ordered    ondansetron (ZOFRAN-ODT) 4 MG disintegrating tablet  Every 8 hours PRN        09/06/22 1809              Honor Loh [place], PA-C 09/06/22 1813    Terald Sleeper, MD 09/07/22 1441

## 2022-09-06 NOTE — ED Triage Notes (Signed)
Pt presents with abdominal pain, with nausea, vomiting and weakness since Tuesday, per pt last time she was here, she was diagnosed with pancreatitis, per pt last drink was on Monday.

## 2022-09-06 NOTE — ED Notes (Signed)
Pt requested to leave ED without getting a CT scan. Provider ok'd patient leaving. AMA was not required due lab value (lipase) being WDL.

## 2022-09-06 NOTE — Discharge Instructions (Addendum)
Please stick to a clear liquid diet for the next few days to allow your stomach to rest.  I sent Zofran to your pharmacy which you can take as well.  Please follow-up with your primary care doctor if you have one.  Return to the emergency department for any worsening symptoms you might have.

## 2022-10-09 ENCOUNTER — Encounter (HOSPITAL_COMMUNITY): Payer: Self-pay

## 2022-10-09 ENCOUNTER — Other Ambulatory Visit: Payer: Self-pay

## 2022-10-09 ENCOUNTER — Emergency Department (HOSPITAL_COMMUNITY)
Admission: EM | Admit: 2022-10-09 | Discharge: 2022-10-09 | Disposition: A | Discharge status: Home / Self Care | Payer: Medicaid - Out of State | Attending: Emergency Medicine | Admitting: Emergency Medicine

## 2022-10-09 DIAGNOSIS — E860 Dehydration: Secondary | ICD-10-CM

## 2022-10-09 DIAGNOSIS — I10 Essential (primary) hypertension: Secondary | ICD-10-CM | POA: Diagnosis not present

## 2022-10-09 DIAGNOSIS — F17210 Nicotine dependence, cigarettes, uncomplicated: Secondary | ICD-10-CM | POA: Insufficient documentation

## 2022-10-09 DIAGNOSIS — K859 Acute pancreatitis without necrosis or infection, unspecified: Secondary | ICD-10-CM | POA: Diagnosis not present

## 2022-10-09 DIAGNOSIS — R1010 Upper abdominal pain, unspecified: Secondary | ICD-10-CM | POA: Diagnosis not present

## 2022-10-09 DIAGNOSIS — R112 Nausea with vomiting, unspecified: Secondary | ICD-10-CM | POA: Diagnosis not present

## 2022-10-09 DIAGNOSIS — R748 Abnormal levels of other serum enzymes: Secondary | ICD-10-CM | POA: Diagnosis not present

## 2022-10-09 DIAGNOSIS — E876 Hypokalemia: Secondary | ICD-10-CM | POA: Diagnosis not present

## 2022-10-09 DIAGNOSIS — M549 Dorsalgia, unspecified: Secondary | ICD-10-CM | POA: Diagnosis not present

## 2022-10-09 DIAGNOSIS — R1084 Generalized abdominal pain: Secondary | ICD-10-CM | POA: Diagnosis not present

## 2022-10-09 LAB — URINALYSIS, ROUTINE W REFLEX MICROSCOPIC
Bilirubin Urine: NEGATIVE
Glucose, UA: NEGATIVE mg/dL
Ketones, ur: 80 mg/dL — AB
Nitrite: NEGATIVE
Protein, ur: >=300 mg/dL — AB
Specific Gravity, Urine: 1.02 (ref 1.005–1.030)
pH: 6 (ref 5.0–8.0)

## 2022-10-09 LAB — CBC
HCT: 40.2 % (ref 36.0–46.0)
Hemoglobin: 14.5 g/dL (ref 12.0–15.0)
MCH: 37.5 pg — ABNORMAL HIGH (ref 26.0–34.0)
MCHC: 36.1 g/dL — ABNORMAL HIGH (ref 30.0–36.0)
MCV: 103.9 fL — ABNORMAL HIGH (ref 80.0–100.0)
Platelets: 157 10*3/uL (ref 150–400)
RBC: 3.87 MIL/uL (ref 3.87–5.11)
RDW: 11.6 % (ref 11.5–15.5)
WBC: 5.9 10*3/uL (ref 4.0–10.5)
nRBC: 0 % (ref 0.0–0.2)

## 2022-10-09 LAB — COMPREHENSIVE METABOLIC PANEL
ALT: 80 U/L — ABNORMAL HIGH (ref 0–44)
AST: 124 U/L — ABNORMAL HIGH (ref 15–41)
Albumin: 4.5 g/dL (ref 3.5–5.0)
Alkaline Phosphatase: 60 U/L (ref 38–126)
Anion gap: 14 (ref 5–15)
BUN: 21 mg/dL — ABNORMAL HIGH (ref 6–20)
CO2: 26 mmol/L (ref 22–32)
Calcium: 9.4 mg/dL (ref 8.9–10.3)
Chloride: 97 mmol/L — ABNORMAL LOW (ref 98–111)
Creatinine, Ser: 0.67 mg/dL (ref 0.44–1.00)
GFR, Estimated: >60 mL/min (ref >60)
Glucose, Bld: 112 mg/dL — ABNORMAL HIGH (ref 70–99)
Potassium: 2.9 mmol/L — ABNORMAL LOW (ref 3.5–5.1)
Sodium: 137 mmol/L (ref 135–145)
Total Bilirubin: 1.2 mg/dL (ref 0.3–1.2)
Total Protein: 8.8 g/dL — ABNORMAL HIGH (ref 6.5–8.1)

## 2022-10-09 LAB — MAGNESIUM: Magnesium: 1.6 mg/dL — ABNORMAL LOW (ref 1.7–2.4)

## 2022-10-09 LAB — LIPASE, BLOOD: Lipase: 187 U/L — ABNORMAL HIGH (ref 11–51)

## 2022-10-09 MED ORDER — HYDROMORPHONE HCL 1 MG/ML IJ SOLN
1.0000 mg | Freq: Once | INTRAMUSCULAR | Status: AC
  Administered 2022-10-09: 1 mg via INTRAVENOUS
  Filled 2022-10-09: qty 1

## 2022-10-09 MED ORDER — ONDANSETRON HCL 4 MG/2ML IJ SOLN
4.0000 mg | Freq: Once | INTRAMUSCULAR | Status: AC
  Administered 2022-10-09: 4 mg via INTRAVENOUS
  Filled 2022-10-09: qty 2

## 2022-10-09 MED ORDER — METOCLOPRAMIDE HCL 5 MG/ML IJ SOLN
10.0000 mg | Freq: Once | INTRAMUSCULAR | Status: AC
  Administered 2022-10-09: 10 mg via INTRAVENOUS
  Filled 2022-10-09: qty 2

## 2022-10-09 MED ORDER — LACTATED RINGERS IV BOLUS
2000.0000 mL | Freq: Once | INTRAVENOUS | Status: AC
  Administered 2022-10-09: 2000 mL via INTRAVENOUS

## 2022-10-09 MED ORDER — HYDROMORPHONE HCL 1 MG/ML IJ SOLN
0.5000 mg | Freq: Once | INTRAMUSCULAR | Status: AC
  Administered 2022-10-09: 0.5 mg via INTRAVENOUS
  Filled 2022-10-09: qty 1

## 2022-10-09 MED ORDER — PANTOPRAZOLE SODIUM 40 MG IV SOLR
40.0000 mg | Freq: Once | INTRAVENOUS | Status: AC
  Administered 2022-10-09: 40 mg via INTRAVENOUS
  Filled 2022-10-09: qty 10

## 2022-10-09 MED ORDER — OXYCODONE-ACETAMINOPHEN 5-325 MG PO TABS: 1.0000 | ORAL_TABLET | Freq: Four times a day (QID) | ORAL | 0 refills | Status: DC | PRN

## 2022-10-09 MED ORDER — POTASSIUM CHLORIDE 10 MEQ/100ML IV SOLN
10.0000 meq | INTRAVENOUS | Status: AC
  Administered 2022-10-09 (×2): 10 meq via INTRAVENOUS
  Filled 2022-10-09 (×2): qty 100

## 2022-10-09 MED ORDER — ACETAMINOPHEN 325 MG PO TABS
ORAL_TABLET | ORAL | Status: AC
  Filled 2022-10-09: qty 2

## 2022-10-09 MED ORDER — LACTATED RINGERS IV SOLN: INTRAVENOUS | Status: DC

## 2022-10-09 MED ORDER — METOCLOPRAMIDE HCL 10 MG PO TABS: 10.0000 mg | ORAL_TABLET | Freq: Four times a day (QID) | ORAL | 0 refills | Status: DC | PRN

## 2022-10-09 MED ORDER — ACETAMINOPHEN 325 MG PO TABS
650.0000 mg | ORAL_TABLET | Freq: Once | ORAL | Status: AC
  Administered 2022-10-09: 650 mg via ORAL

## 2022-10-09 NOTE — ED Triage Notes (Addendum)
Patient BIB [facility] from home. For 3 days patient has had worsening nausea, vomiting, diarrhea. Unable to keep anything down. Body aches. EMS gave 650mg  tylenol for pain management.

## 2022-10-09 NOTE — Discharge Instructions (Addendum)
Make sure you are doing just broths and liquids for the next day or so and if you are tolerating that you can advance to foods like toast, applesauce, rice and then slowly advance back to your normal diet.  Avoid all alcohol.  If you start getting worse, vomiting returns, pain gets worse you start having fevers or other concerns return to the emergency room.

## 2022-10-09 NOTE — ED Provider Triage Note (Signed)
Emergency Medicine Provider Triage Evaluation Note  GERALINE HALBERSTADT , a 52 y.o. female  was evaluated in triage.  Pt complains of 3 days of upper abdominal pain. With nausea and vomiting. History of pancreatitis. Non bloody, bilious emesis.  Denies fevers, chills. Pancreatitis, last had etoh on 09/30/22 Review of Systems  Positive: As above Negative: As above  Physical Exam  BP (!) 163/95   Pulse 72   Temp 98.4 F (36.9 C) (Oral)   Resp 18   LMP 01/05/2021 (Within Days) Comment: Pt has not been active in a year and pt states she's not getting periods anymore.  SpO2 94%  Gen:   Awake, no distress   Resp:  Normal effort  MSK:   Moves extremities without difficulty  Other:    Medical Decision Making  Medically screening exam initiated at 9:58 AM.  Appropriate orders placed.  Elisha Mcgruder Odriscoll was informed that the remainder of the evaluation will be completed by another provider, this initial triage assessment does not replace that evaluation, and the importance of remaining in the ED until their evaluation is complete.     Tacy Learn, PA-C 10/09/22 [phone]

## 2022-10-09 NOTE — ED Provider Notes (Signed)
[facility]-EMERGENCY DEPT Provider Note   CSN: [id number] Arrival date & time: 10/09/22  4742     History  Chief Complaint  Patient presents with   Emesis    Kaitlin Carpenter is a 52 y.o. female.  Patient is a 52 year old female with a history of prior pancreatitis and intermittent alcohol use who is presenting today with complaint of upper abdominal pain, nausea, vomiting, occasional diarrhea that has been waxing and waning since Christmas.  She reports that Christmas she could eat almost nothing because she was so nauseated and her stomach was hurting.  She was initially not even able to hold down water but that improved some and she was able to hold down some water but still has not been able to eat much.  On New Year's she was celebrating her birthday and did have 2 shots and reports it did make her stomach feel much worse.  Since that time she has had ongoing symptoms but last night she vomited all night long reporting she filled 3 metal trash cans with all bilious looking emesis.  She is having persistent epigastric pain and feels sore all over from all the vomiting.  At no time has she had any hematemesis.  She denies drinking on a regular basis and denies any withdrawal symptoms.  She denies using marijuana but does smoke cigarettes.  She does report a prior history of gastritis but does not take PPIs regularly.  She normally uses Tums.  She has not had shortness of breath, fever or chest pain.  Does report a prior history of pancreatitis.  Last CT done in October no evidence of pseudocyst at that time.  The history is provided by the patient.  Emesis      Home Medications Prior to Admission medications   Medication Sig Start Date End Date Taking? Authorizing Provider  metoCLOPramide (REGLAN) 10 MG tablet Take 1 tablet (10 mg total) by mouth every 6 (six) hours as needed for nausea. 10/09/22  Yes Gwyneth Sprout, MD  oxyCODONE-acetaminophen (PERCOCET/ROXICET)  5-325 MG tablet Take 1 tablet by mouth every 6 (six) hours as needed for severe pain. 10/09/22  Yes Gwyneth Sprout, MD  HYDROcodone-acetaminophen (NORCO/VICODIN) 5-325 MG tablet Take 1 tablet by mouth every 6 (six) hours as needed for moderate pain or severe pain. Patient not taking: Reported on 09/06/2022 03/28/21   Mardella Layman, MD  meloxicam (MOBIC) 15 MG tablet Take 1 tablet (15 mg total) by mouth daily. Patient not taking: Reported on 09/06/2022 03/28/21   Mardella Layman, MD  ondansetron (ZOFRAN) 4 MG tablet Take 1 tablet (4 mg total) by mouth every 6 (six) hours. Patient not taking: Reported on 09/06/2022 07/26/22   Prosperi, Christian H, PA-C  ondansetron (ZOFRAN-ODT) 4 MG disintegrating tablet Take 1 tablet (4 mg total) by mouth every 8 (eight) hours as needed. 09/06/22   Honor Loh M, PA-C  predniSONE (DELTASONE) 10 MG tablet Take 2 tablets (20 mg total) by mouth daily. Patient not taking: Reported on 09/06/2022 04/07/21   Bethann Berkshire, MD      Allergies    Patient has no known allergies.    Review of Systems   Review of Systems  Gastrointestinal:  Positive for vomiting.    Physical Exam Updated Vital Signs BP (!) 163/66   Pulse (!) 115   Temp 99 F (37.2 C) (Oral)   Resp 19   LMP 01/05/2021 (Within Days) Comment: Pt has not been active in a year and pt states  she's not getting periods anymore.  SpO2 98%  Physical Exam Vitals and nursing note reviewed.  Constitutional:      General: She is not in acute distress.    Appearance: She is well-developed.  HENT:     Head: Normocephalic and atraumatic.     Mouth/Throat:     Mouth: Mucous membranes are dry.  Eyes:     Pupils: Pupils are equal, round, and reactive to light.  Cardiovascular:     Rate and Rhythm: Normal rate and regular rhythm.     Heart sounds: Normal heart sounds. No murmur heard.    No friction rub.  Pulmonary:     Effort: Pulmonary effort is normal.     Breath sounds: Normal breath sounds. No wheezing  or rales.  Abdominal:     General: Bowel sounds are normal. There is no distension.     Palpations: Abdomen is soft.     Tenderness: There is abdominal tenderness in the epigastric area. There is guarding. There is no right CVA tenderness, left CVA tenderness or rebound.  Musculoskeletal:        General: No tenderness. Normal range of motion.     Right lower leg: No edema.     Left lower leg: No edema.     Comments: No edema  Skin:    General: Skin is warm and dry.     Findings: No rash.     Comments: Poor skin turgor  Neurological:     Mental Status: She is alert and oriented to person, place, and time.     Cranial Nerves: No cranial nerve deficit.  Psychiatric:        Behavior: Behavior normal.     ED Results / Procedures / Treatments   Labs (all labs ordered are listed, but only abnormal results are displayed) Labs Reviewed  LIPASE, BLOOD - Abnormal; Notable for the following components:      Result Value   Lipase 187 (*)    All other components within normal limits  COMPREHENSIVE METABOLIC PANEL - Abnormal; Notable for the following components:   Potassium 2.9 (*)    Chloride 97 (*)    Glucose, Bld 112 (*)    BUN 21 (*)    Total Protein 8.8 (*)    AST 124 (*)    ALT 80 (*)    All other components within normal limits  CBC - Abnormal; Notable for the following components:   MCV 103.9 (*)    MCH 37.5 (*)    MCHC 36.1 (*)    All other components within normal limits  URINALYSIS, ROUTINE W REFLEX MICROSCOPIC - Abnormal; Notable for the following components:   Color, Urine AMBER (*)    APPearance CLOUDY (*)    Hgb urine dipstick SMALL (*)    Ketones, ur 80 (*)    Protein, ur >=300 (*)    Leukocytes,Ua SMALL (*)    Bacteria, UA MANY (*)    All other components within normal limits  MAGNESIUM - Abnormal; Notable for the following components:   Magnesium 1.6 (*)    All other components within normal limits    EKG None  Radiology No results  found.  Procedures Procedures    Medications Ordered in ED Medications  lactated ringers infusion ( Intravenous New Bag/Given 10/09/22 1754)  lactated ringers bolus 2,000 mL (2,000 mLs Intravenous New Bag/Given 10/09/22 1753)  ondansetron (ZOFRAN) injection 4 mg (4 mg Intravenous Given 10/09/22 1752)  acetaminophen (TYLENOL) tablet 650 mg (  650 mg Oral Given 10/09/22 1256)  acetaminophen (TYLENOL) 325 MG tablet (  Given by Other 10/09/22 1300)  HYDROmorphone (DILAUDID) injection 0.5 mg (0.5 mg Intravenous Given 10/09/22 1757)  potassium chloride 10 mEq in 100 mL IVPB (10 mEq Intravenous New Bag/Given 10/09/22 1920)  metoCLOPramide (REGLAN) injection 10 mg (10 mg Intravenous Given 10/09/22 1953)  HYDROmorphone (DILAUDID) injection 1 mg (1 mg Intravenous Given 10/09/22 1953)  pantoprazole (PROTONIX) injection 40 mg (40 mg Intravenous Given 10/09/22 1953)    ED Course/ Medical Decision Making/ A&P                           Medical Decision Making Amount and/or Complexity of Data Reviewed Labs: ordered.  Risk Prescription drug management.   Pt with multiple medical problems and comorbidities and presenting today with a complaint that caries a high risk for morbidity and mortality.  Here today with ongoing nausea vomiting, occasional diarrhea and epigastric pain in the setting of prior history of pancreatitis.  Patient had a CT scan done in October of last year that showed pancreatitis.  Patient has no lower abdominal pain concerning for diverticulitis or appendicitis.  She has no CVA tenderness consistent with pyelonephritis.  Concern for pancreatitis, gastritis or acute gallbladder pathology.  Patient also appears dehydrated and concern for possible AKI.  I independently interpreted patient's labs today UA with negative nitrites, small leukocytes and 21-50 white blood cells with many bacteria, lipase elevated at 187 today and CMP with hypokalemia of 2.9 consistent with persistent vomiting, normal renal  function and transaminitis with AST of 124, ALT of 80 and a normal total bilirubin.  Elevated LFTs are not significantly different from prior tests.  CBC within normal limits.  Suspect patient is suffering with pancreatitis.  Given IV fluids, nausea and pain control.  Will replace potassium and check magnesium levels.  8:32 PM Mag only minimally low at 1.6.  After 2 rounds of medication, fluids and potassium as well as Protonix patient is feeling better.  Tolerating p.o.'s and wishes to leave.  She was given return precautions and prescriptions.  Most recent vital signs with blood pressure in the 150s, heart rate less than 100 with normal O2 sat and respiratory rate.  She appears comfortable on exam.        Final Clinical Impression(s) / ED Diagnoses Final diagnoses:  Dehydration  Acute pancreatitis, unspecified complication status, unspecified pancreatitis type  Hypokalemia    Rx / DC Orders ED Discharge Orders          Ordered    oxyCODONE-acetaminophen (PERCOCET/ROXICET) 5-325 MG tablet  Every 6 hours PRN        10/09/22 2032    metoCLOPramide (REGLAN) 10 MG tablet  Every 6 hours PRN        10/09/22 2032              Gwyneth Sprout, MD 10/09/22 2032

## 2022-12-18 ENCOUNTER — Emergency Department (HOSPITAL_COMMUNITY): Payer: Medicaid Other

## 2022-12-18 ENCOUNTER — Other Ambulatory Visit: Payer: Self-pay

## 2022-12-18 ENCOUNTER — Inpatient Hospital Stay (HOSPITAL_COMMUNITY)
Admission: EM | Admit: 2022-12-18 | Discharge: 2022-12-21 | DRG: 439 | Disposition: A | Discharge status: Home / Self Care | Payer: Medicaid Other | Attending: Internal Medicine | Admitting: Internal Medicine

## 2022-12-18 ENCOUNTER — Encounter (HOSPITAL_COMMUNITY): Payer: Self-pay

## 2022-12-18 DIAGNOSIS — E876 Hypokalemia: Secondary | ICD-10-CM | POA: Diagnosis present

## 2022-12-18 DIAGNOSIS — I10 Essential (primary) hypertension: Secondary | ICD-10-CM | POA: Diagnosis present

## 2022-12-18 DIAGNOSIS — K859 Acute pancreatitis without necrosis or infection, unspecified: Principal | ICD-10-CM

## 2022-12-18 DIAGNOSIS — E871 Hypo-osmolality and hyponatremia: Secondary | ICD-10-CM | POA: Diagnosis present

## 2022-12-18 DIAGNOSIS — K921 Melena: Secondary | ICD-10-CM | POA: Diagnosis present

## 2022-12-18 DIAGNOSIS — Z1152 Encounter for screening for COVID-19: Secondary | ICD-10-CM

## 2022-12-18 DIAGNOSIS — N200 Calculus of kidney: Secondary | ICD-10-CM | POA: Diagnosis not present

## 2022-12-18 DIAGNOSIS — K760 Fatty (change of) liver, not elsewhere classified: Secondary | ICD-10-CM | POA: Diagnosis present

## 2022-12-18 DIAGNOSIS — K852 Alcohol induced acute pancreatitis without necrosis or infection: Principal | ICD-10-CM | POA: Diagnosis present

## 2022-12-18 DIAGNOSIS — K5730 Diverticulosis of large intestine without perforation or abscess without bleeding: Secondary | ICD-10-CM | POA: Diagnosis present

## 2022-12-18 DIAGNOSIS — F17210 Nicotine dependence, cigarettes, uncomplicated: Secondary | ICD-10-CM | POA: Diagnosis present

## 2022-12-18 DIAGNOSIS — Z791 Long term (current) use of non-steroidal anti-inflammatories (NSAID): Secondary | ICD-10-CM

## 2022-12-18 DIAGNOSIS — K7010 Alcoholic hepatitis without ascites: Secondary | ICD-10-CM | POA: Diagnosis present

## 2022-12-18 DIAGNOSIS — Z800 Family history of malignant neoplasm of digestive organs: Secondary | ICD-10-CM

## 2022-12-18 DIAGNOSIS — Z720 Tobacco use: Secondary | ICD-10-CM | POA: Insufficient documentation

## 2022-12-18 DIAGNOSIS — Z6820 Body mass index (BMI) 20.0-20.9, adult: Secondary | ICD-10-CM

## 2022-12-18 DIAGNOSIS — F1010 Alcohol abuse, uncomplicated: Secondary | ICD-10-CM | POA: Diagnosis present

## 2022-12-18 DIAGNOSIS — R8281 Pyuria: Secondary | ICD-10-CM | POA: Diagnosis present

## 2022-12-18 DIAGNOSIS — K860 Alcohol-induced chronic pancreatitis: Secondary | ICD-10-CM | POA: Diagnosis present

## 2022-12-18 DIAGNOSIS — E869 Volume depletion, unspecified: Secondary | ICD-10-CM | POA: Diagnosis present

## 2022-12-18 DIAGNOSIS — Z7952 Long term (current) use of systemic steroids: Secondary | ICD-10-CM

## 2022-12-18 DIAGNOSIS — F1020 Alcohol dependence, uncomplicated: Secondary | ICD-10-CM | POA: Diagnosis present

## 2022-12-18 DIAGNOSIS — R634 Abnormal weight loss: Secondary | ICD-10-CM | POA: Diagnosis present

## 2022-12-18 DIAGNOSIS — R112 Nausea with vomiting, unspecified: Secondary | ICD-10-CM | POA: Diagnosis present

## 2022-12-18 LAB — URINALYSIS, MICROSCOPIC (REFLEX)

## 2022-12-18 LAB — COMPREHENSIVE METABOLIC PANEL
ALT: 62 U/L — ABNORMAL HIGH (ref 0–44)
AST: 82 U/L — ABNORMAL HIGH (ref 15–41)
Albumin: 4.7 g/dL (ref 3.5–5.0)
Alkaline Phosphatase: 62 U/L (ref 38–126)
Anion gap: 16 — ABNORMAL HIGH (ref 5–15)
BUN: 28 mg/dL — ABNORMAL HIGH (ref 6–20)
CO2: 25 mmol/L (ref 22–32)
Calcium: 9.3 mg/dL (ref 8.9–10.3)
Chloride: 93 mmol/L — ABNORMAL LOW (ref 98–111)
Creatinine, Ser: 0.77 mg/dL (ref 0.44–1.00)
GFR, Estimated: >60 mL/min (ref >60)
Glucose, Bld: 119 mg/dL — ABNORMAL HIGH (ref 70–99)
Potassium: 3.7 mmol/L (ref 3.5–5.1)
Sodium: 134 mmol/L — ABNORMAL LOW (ref 135–145)
Total Bilirubin: 1.2 mg/dL (ref 0.3–1.2)
Total Protein: 9.2 g/dL — ABNORMAL HIGH (ref 6.5–8.1)

## 2022-12-18 LAB — CBC
HCT: 48.6 % — ABNORMAL HIGH (ref 36.0–46.0)
Hemoglobin: 17.1 g/dL — ABNORMAL HIGH (ref 12.0–15.0)
MCH: 36.3 pg — ABNORMAL HIGH (ref 26.0–34.0)
MCHC: 35.2 g/dL (ref 30.0–36.0)
MCV: 103.2 fL — ABNORMAL HIGH (ref 80.0–100.0)
Platelets: 252 10*3/uL (ref 150–400)
RBC: 4.71 MIL/uL (ref 3.87–5.11)
RDW: 11.9 % (ref 11.5–15.5)
WBC: 8.2 10*3/uL (ref 4.0–10.5)
nRBC: 0 % (ref 0.0–0.2)

## 2022-12-18 LAB — URINALYSIS, ROUTINE W REFLEX MICROSCOPIC
Bilirubin Urine: NEGATIVE
Glucose, UA: NEGATIVE mg/dL
Ketones, ur: 15 mg/dL — AB
Leukocytes,Ua: NEGATIVE
Nitrite: NEGATIVE
Protein, ur: >300 mg/dL — AB
Specific Gravity, Urine: >1.03 — ABNORMAL HIGH (ref 1.005–1.030)
pH: 6 (ref 5.0–8.0)

## 2022-12-18 LAB — PREGNANCY, URINE: Preg Test, Ur: NEGATIVE

## 2022-12-18 LAB — LIPASE, BLOOD: Lipase: 158 U/L — ABNORMAL HIGH (ref 11–51)

## 2022-12-18 MED ORDER — ONDANSETRON 4 MG PO TBDP
4.0000 mg | ORAL_TABLET | Freq: Once | ORAL | Status: AC
  Administered 2022-12-18: 4 mg via ORAL
  Filled 2022-12-18: qty 1

## 2022-12-18 MED ORDER — IOHEXOL 300 MG/ML  SOLN
100.0000 mL | Freq: Once | INTRAMUSCULAR | Status: AC | PRN
  Administered 2022-12-18: 100 mL via INTRAVENOUS

## 2022-12-18 MED ORDER — HYDROMORPHONE HCL 1 MG/ML IJ SOLN
INTRAMUSCULAR | Status: AC
  Filled 2022-12-18: qty 1

## 2022-12-18 MED ORDER — METOCLOPRAMIDE HCL 5 MG/ML IJ SOLN
10.0000 mg | Freq: Once | INTRAMUSCULAR | Status: AC
  Administered 2022-12-18: 10 mg via INTRAVENOUS

## 2022-12-18 MED ORDER — METOCLOPRAMIDE HCL 5 MG/ML IJ SOLN
INTRAMUSCULAR | Status: AC
  Filled 2022-12-18: qty 2

## 2022-12-18 MED ORDER — HYDROMORPHONE HCL 1 MG/ML IJ SOLN
1.0000 mg | Freq: Once | INTRAMUSCULAR | Status: AC
  Administered 2022-12-18: 1 mg via INTRAVENOUS

## 2022-12-18 MED ORDER — LACTATED RINGERS IV BOLUS
2000.0000 mL | Freq: Once | INTRAVENOUS | Status: AC
  Administered 2022-12-18: 2000 mL via INTRAVENOUS

## 2022-12-18 NOTE — ED Provider Triage Note (Signed)
Emergency Medicine Provider Triage Evaluation Note  Kaitlin Carpenter , a 52 y.o. female  was evaluated in triage.  Pt complains of nausea, vomiting, diarrhea for the past 4 days with the inability to tolerate any food for the past 6 days.  Patient also reports diaphoresis, chills.  Patient states he has a history of pancreatitis and states this feels similar to previous flares.  Family history positive for pancreatic cancer.  Patient does endorse alcohol consumption just prior to onset of the symptoms and states that previous flares have likely been due to alcohol use.  Review of Systems  Positive: As above Negative: As above  Physical Exam  BP (!) 168/105   Pulse 81   Temp 97.7 F (36.5 C) (Oral)   Resp 19   Ht 5\' 4"  (1.626 m)   Wt 59 kg   LMP 01/05/2021 (Within Days) Comment: Pt has not been active in a year and pt states she's not getting periods anymore.  SpO2 100%   BMI 22.31 kg/m  Gen:   Awake, no distress   Resp:  Normal effort  MSK:   Moves extremities without difficulty  Other:  Patient dry heaving in triage  Medical Decision Making  Medically screening exam initiated at 6:30 PM.  Appropriate orders placed.  Kaitlin Carpenter was informed that the remainder of the evaluation will be completed by another provider, this initial triage assessment does not replace that evaluation, and the importance of remaining in the ED until their evaluation is complete.     Dorothyann Peng, PA-C 12/18/22 1831

## 2022-12-18 NOTE — ED Notes (Signed)
Pt resting much better.  No vomiting

## 2022-12-18 NOTE — ED Provider Notes (Signed)
[facility] Provider Note   CSN: [id number] Arrival date & time: 12/18/22  1619     History {Add pertinent medical, surgical, social history, OB history to HPI:1} Chief Complaint  Patient presents with   Abdominal Pain    Kaitlin Carpenter is a 52 y.o. female.   Abdominal Pain Patient presents for abdominal pain.  Medical history includes***.  Over the past 4 days, she has had abdominal pain, nausea, vomiting, diarrhea.  She has not been able to tolerate any food in the past 6 days.  She has had intermittent diaphoresis and chills.  Symptoms are reminiscent of prior episodes of pancreatitis.  Alcohol use ***     Home Medications Prior to Admission medications   Medication Sig Start Date End Date Taking? Authorizing Provider  HYDROcodone-acetaminophen (NORCO/VICODIN) 5-325 MG tablet Take 1 tablet by mouth every 6 (six) hours as needed for moderate pain or severe pain. Patient not taking: Reported on 09/06/2022 03/28/21   Vanessa Kick, MD  meloxicam (MOBIC) 15 MG tablet Take 1 tablet (15 mg total) by mouth daily. Patient not taking: Reported on 09/06/2022 03/28/21   Vanessa Kick, MD  metoCLOPramide (REGLAN) 10 MG tablet Take 1 tablet (10 mg total) by mouth every 6 (six) hours as needed for nausea. 10/09/22   Blanchie Dessert, MD  ondansetron (ZOFRAN) 4 MG tablet Take 1 tablet (4 mg total) by mouth every 6 (six) hours. Patient not taking: Reported on 09/06/2022 07/26/22   Prosperi, Christian H, PA-C  ondansetron (ZOFRAN-ODT) 4 MG disintegrating tablet Take 1 tablet (4 mg total) by mouth every 8 (eight) hours as needed. 09/06/22   Hendricks Limes, PA-C  oxyCODONE-acetaminophen (PERCOCET/ROXICET) 5-325 MG tablet Take 1 tablet by mouth every 6 (six) hours as needed for severe pain. 10/09/22   Blanchie Dessert, MD  predniSONE (DELTASONE) 10 MG tablet Take 2 tablets (20 mg total) by mouth daily. Patient not taking: Reported on 09/06/2022 04/07/21    Milton Ferguson, MD      Allergies    Patient has no known allergies.    Review of Systems   Review of Systems  Gastrointestinal:  Positive for abdominal pain.    Physical Exam Updated Vital Signs BP (!) 168/105   Pulse 81   Temp 97.7 F (36.5 C) (Oral)   Resp 19   Ht 5\' 4"  (1.626 m)   Wt 59 kg   LMP 01/05/2021 (Within Days) Comment: Pt has not been active in a year and pt states she's not getting periods anymore.  SpO2 100%   BMI 22.31 kg/m  Physical Exam  ED Results / Procedures / Treatments   Labs (all labs ordered are listed, but only abnormal results are displayed) Labs Reviewed  LIPASE, BLOOD - Abnormal; Notable for the following components:      Result Value   Lipase 158 (*)    All other components within normal limits  COMPREHENSIVE METABOLIC PANEL - Abnormal; Notable for the following components:   Sodium 134 (*)    Chloride 93 (*)    Glucose, Bld 119 (*)    BUN 28 (*)    Total Protein 9.2 (*)    AST 82 (*)    ALT 62 (*)    Anion gap 16 (*)    All other components within normal limits  URINALYSIS, ROUTINE W REFLEX MICROSCOPIC - Abnormal; Notable for the following components:   Specific Gravity, Urine >1.030 (*)    Hgb urine dipstick MODERATE (*)  Ketones, ur 15 (*)    Protein, ur >300 (*)    All other components within normal limits  URINALYSIS, MICROSCOPIC (REFLEX) - Abnormal; Notable for the following components:   Bacteria, UA RARE (*)    All other components within normal limits  CBC - Abnormal; Notable for the following components:   Hemoglobin 17.1 (*)    HCT 48.6 (*)    MCV 103.2 (*)    MCH 36.3 (*)    All other components within normal limits  PREGNANCY, URINE    EKG None  Radiology No results found.  Procedures Procedures  {Document cardiac monitor, telemetry assessment procedure when appropriate:1}  Medications Ordered in ED Medications  lactated ringers bolus 2,000 mL (has no administration in time range)  ondansetron  (ZOFRAN-ODT) disintegrating tablet 4 mg (4 mg Oral Given 12/18/22 1740)    ED Course/ Medical Decision Making/ A&P   {   Click here for ABCD2, HEART and other calculatorsREFRESH Note before signing :1}                          Medical Decision Making Amount and/or Complexity of Data Reviewed Labs: ordered. Radiology: ordered.  Risk Prescription drug management.   ***  {Document critical care time when appropriate:1} {Document review of labs and clinical decision tools ie heart score, Chads2Vasc2 etc:1}  {Document your independent review of radiology images, and any outside records:1} {Document your discussion with family members, caretakers, and with consultants:1} {Document social determinants of health affecting pt's care:1} {Document your decision making why or why not admission, treatments were needed:1} Final Clinical Impression(s) / ED Diagnoses Final diagnoses:  None    Rx / DC Orders ED Discharge Orders     None

## 2022-12-18 NOTE — ED Notes (Signed)
Patient transported to CT 

## 2022-12-18 NOTE — ED Triage Notes (Signed)
Pt states she has had N/V/D x 1 week. Unable to tolerate liquids or solids x 3 days. Low grade fever, diaphoresis, chills.   Attempted to take advil for pain, unable to keep it down.   Hx of pancreatitis, states this feels similar to previous flare up. Family hx of pancreatic cancer.   Pt A & O x 4 in triage, ambulatory.

## 2022-12-18 NOTE — ED Notes (Signed)
ED Provider at bedside. 

## 2022-12-19 ENCOUNTER — Observation Stay (HOSPITAL_COMMUNITY): Payer: Medicaid Other

## 2022-12-19 DIAGNOSIS — Z791 Long term (current) use of non-steroidal anti-inflammatories (NSAID): Secondary | ICD-10-CM | POA: Diagnosis not present

## 2022-12-19 DIAGNOSIS — K860 Alcohol-induced chronic pancreatitis: Secondary | ICD-10-CM | POA: Diagnosis present

## 2022-12-19 DIAGNOSIS — K921 Melena: Secondary | ICD-10-CM | POA: Diagnosis not present

## 2022-12-19 DIAGNOSIS — E871 Hypo-osmolality and hyponatremia: Secondary | ICD-10-CM | POA: Insufficient documentation

## 2022-12-19 DIAGNOSIS — N200 Calculus of kidney: Secondary | ICD-10-CM | POA: Diagnosis not present

## 2022-12-19 DIAGNOSIS — R112 Nausea with vomiting, unspecified: Secondary | ICD-10-CM | POA: Diagnosis not present

## 2022-12-19 DIAGNOSIS — Z7952 Long term (current) use of systemic steroids: Secondary | ICD-10-CM | POA: Diagnosis not present

## 2022-12-19 DIAGNOSIS — K852 Alcohol induced acute pancreatitis without necrosis or infection: Secondary | ICD-10-CM | POA: Diagnosis not present

## 2022-12-19 DIAGNOSIS — E876 Hypokalemia: Secondary | ICD-10-CM | POA: Diagnosis not present

## 2022-12-19 DIAGNOSIS — K7010 Alcoholic hepatitis without ascites: Secondary | ICD-10-CM | POA: Diagnosis present

## 2022-12-19 DIAGNOSIS — F1020 Alcohol dependence, uncomplicated: Secondary | ICD-10-CM | POA: Diagnosis present

## 2022-12-19 DIAGNOSIS — K8500 Idiopathic acute pancreatitis without necrosis or infection: Secondary | ICD-10-CM | POA: Diagnosis not present

## 2022-12-19 DIAGNOSIS — Z1152 Encounter for screening for COVID-19: Secondary | ICD-10-CM | POA: Diagnosis not present

## 2022-12-19 DIAGNOSIS — Z800 Family history of malignant neoplasm of digestive organs: Secondary | ICD-10-CM | POA: Diagnosis not present

## 2022-12-19 DIAGNOSIS — E869 Volume depletion, unspecified: Secondary | ICD-10-CM | POA: Diagnosis present

## 2022-12-19 DIAGNOSIS — F17210 Nicotine dependence, cigarettes, uncomplicated: Secondary | ICD-10-CM | POA: Diagnosis present

## 2022-12-19 DIAGNOSIS — K5730 Diverticulosis of large intestine without perforation or abscess without bleeding: Secondary | ICD-10-CM

## 2022-12-19 DIAGNOSIS — R634 Abnormal weight loss: Secondary | ICD-10-CM | POA: Diagnosis present

## 2022-12-19 DIAGNOSIS — I10 Essential (primary) hypertension: Secondary | ICD-10-CM | POA: Diagnosis present

## 2022-12-19 DIAGNOSIS — K859 Acute pancreatitis without necrosis or infection, unspecified: Secondary | ICD-10-CM | POA: Diagnosis present

## 2022-12-19 DIAGNOSIS — R109 Unspecified abdominal pain: Secondary | ICD-10-CM | POA: Diagnosis not present

## 2022-12-19 DIAGNOSIS — F1010 Alcohol abuse, uncomplicated: Secondary | ICD-10-CM

## 2022-12-19 DIAGNOSIS — Z720 Tobacco use: Secondary | ICD-10-CM | POA: Insufficient documentation

## 2022-12-19 DIAGNOSIS — K760 Fatty (change of) liver, not elsewhere classified: Secondary | ICD-10-CM | POA: Diagnosis not present

## 2022-12-19 DIAGNOSIS — R8281 Pyuria: Secondary | ICD-10-CM | POA: Diagnosis present

## 2022-12-19 DIAGNOSIS — Z6820 Body mass index (BMI) 20.0-20.9, adult: Secondary | ICD-10-CM | POA: Diagnosis not present

## 2022-12-19 LAB — COMPREHENSIVE METABOLIC PANEL
ALT: 39 U/L (ref 0–44)
AST: 57 U/L — ABNORMAL HIGH (ref 15–41)
Albumin: 3.3 g/dL — ABNORMAL LOW (ref 3.5–5.0)
Alkaline Phosphatase: 41 U/L (ref 38–126)
Anion gap: 9 (ref 5–15)
BUN: 23 mg/dL — ABNORMAL HIGH (ref 6–20)
CO2: 27 mmol/L (ref 22–32)
Calcium: 8.2 mg/dL — ABNORMAL LOW (ref 8.9–10.3)
Chloride: 96 mmol/L — ABNORMAL LOW (ref 98–111)
Creatinine, Ser: 0.65 mg/dL (ref 0.44–1.00)
GFR, Estimated: >60 mL/min (ref >60)
Glucose, Bld: 88 mg/dL (ref 70–99)
Potassium: 3.1 mmol/L — ABNORMAL LOW (ref 3.5–5.1)
Sodium: 132 mmol/L — ABNORMAL LOW (ref 135–145)
Total Bilirubin: 1.1 mg/dL (ref 0.3–1.2)
Total Protein: 6.5 g/dL (ref 6.5–8.1)

## 2022-12-19 LAB — CBC WITH DIFFERENTIAL/PLATELET
Abs Immature Granulocytes: 0.02 10*3/uL (ref 0.00–0.07)
Basophils Absolute: 0 10*3/uL (ref 0.0–0.1)
Basophils Relative: 0 %
Eosinophils Absolute: 0.1 10*3/uL (ref 0.0–0.5)
Eosinophils Relative: 1 %
HCT: 37.4 % (ref 36.0–46.0)
Hemoglobin: 13.2 g/dL (ref 12.0–15.0)
Immature Granulocytes: 0 %
Lymphocytes Relative: 30 %
Lymphs Abs: 3 10*3/uL (ref 0.7–4.0)
MCH: 36.3 pg — ABNORMAL HIGH (ref 26.0–34.0)
MCHC: 35.3 g/dL (ref 30.0–36.0)
MCV: 102.7 fL — ABNORMAL HIGH (ref 80.0–100.0)
Monocytes Absolute: 0.9 10*3/uL (ref 0.1–1.0)
Monocytes Relative: 9 %
Neutro Abs: 6 10*3/uL (ref 1.7–7.7)
Neutrophils Relative %: 60 %
Platelets: 179 10*3/uL (ref 150–400)
RBC: 3.64 MIL/uL — ABNORMAL LOW (ref 3.87–5.11)
RDW: 11.9 % (ref 11.5–15.5)
WBC: 10 10*3/uL (ref 4.0–10.5)
nRBC: 0 % (ref 0.0–0.2)

## 2022-12-19 LAB — RAPID URINE DRUG SCREEN, HOSP PERFORMED
Amphetamines: NOT DETECTED
Barbiturates: NOT DETECTED
Benzodiazepines: NOT DETECTED
Cocaine: NOT DETECTED
Opiates: POSITIVE — AB
Tetrahydrocannabinol: NOT DETECTED

## 2022-12-19 LAB — LIPID PANEL
Cholesterol: 90 mg/dL (ref 0–200)
HDL: 39 mg/dL — ABNORMAL LOW (ref >40)
LDL Cholesterol: 40 mg/dL (ref 0–99)
Total CHOL/HDL Ratio: 2.3 RATIO
Triglycerides: 53 mg/dL (ref <150)
VLDL: 11 mg/dL (ref 0–40)

## 2022-12-19 LAB — URINALYSIS, W/ REFLEX TO CULTURE (INFECTION SUSPECTED)
Bacteria, UA: NONE SEEN
Bilirubin Urine: NEGATIVE
Glucose, UA: NEGATIVE mg/dL
Ketones, ur: 20 mg/dL — AB
Leukocytes,Ua: NEGATIVE
Nitrite: NEGATIVE
Protein, ur: NEGATIVE mg/dL
Specific Gravity, Urine: 1.016 (ref 1.005–1.030)
pH: 7 (ref 5.0–8.0)

## 2022-12-19 LAB — LIPASE, BLOOD: Lipase: 204 U/L — ABNORMAL HIGH (ref 11–51)

## 2022-12-19 LAB — MAGNESIUM: Magnesium: 1.7 mg/dL (ref 1.7–2.4)

## 2022-12-19 LAB — HIV ANTIBODY (ROUTINE TESTING W REFLEX): HIV Screen 4th Generation wRfx: NONREACTIVE

## 2022-12-19 MED ORDER — MORPHINE SULFATE (PF) 2 MG/ML IV SOLN: 2.0000 mg | INTRAVENOUS | Status: DC | PRN

## 2022-12-19 MED ORDER — HEPARIN SODIUM (PORCINE) 5000 UNIT/ML IJ SOLN
5000.0000 [IU] | Freq: Three times a day (TID) | INTRAMUSCULAR | Status: DC
  Administered 2022-12-19 – 2022-12-21 (×7): 5000 [IU] via SUBCUTANEOUS
  Filled 2022-12-19 (×7): qty 1

## 2022-12-19 MED ORDER — ONDANSETRON HCL 4 MG PO TABS
4.0000 mg | ORAL_TABLET | Freq: Four times a day (QID) | ORAL | Status: DC | PRN
  Administered 2022-12-21: 4 mg via ORAL
  Filled 2022-12-19: qty 1

## 2022-12-19 MED ORDER — POTASSIUM CHLORIDE IN NACL 20-0.9 MEQ/L-% IV SOLN: INTRAVENOUS | Status: DC

## 2022-12-19 MED ORDER — POTASSIUM CHLORIDE IN NACL 40-0.9 MEQ/L-% IV SOLN: INTRAVENOUS | Status: DC

## 2022-12-19 MED ORDER — OXYCODONE-ACETAMINOPHEN 5-325 MG PO TABS
2.0000 | ORAL_TABLET | Freq: Once | ORAL | Status: AC
  Administered 2022-12-19: 2 via ORAL
  Filled 2022-12-19: qty 2

## 2022-12-19 MED ORDER — LACTATED RINGERS IV BOLUS
1000.0000 mL | Freq: Once | INTRAVENOUS | Status: AC
  Administered 2022-12-19: 1000 mL via INTRAVENOUS

## 2022-12-19 MED ORDER — METOCLOPRAMIDE HCL 10 MG PO TABS: 10.0000 mg | ORAL_TABLET | Freq: Four times a day (QID) | ORAL | Status: DC | PRN

## 2022-12-19 MED ORDER — ACETAMINOPHEN 325 MG PO TABS
650.0000 mg | ORAL_TABLET | Freq: Four times a day (QID) | ORAL | Status: DC | PRN
  Administered 2022-12-19 – 2022-12-21 (×3): 650 mg via ORAL
  Filled 2022-12-19 (×3): qty 2

## 2022-12-19 MED ORDER — THIAMINE HCL 100 MG/ML IJ SOLN
100.0000 mg | Freq: Every day | INTRAMUSCULAR | Status: DC
  Administered 2022-12-19 – 2022-12-21 (×3): 100 mg via INTRAVENOUS
  Filled 2022-12-19 (×3): qty 2

## 2022-12-19 MED ORDER — ACETAMINOPHEN 650 MG RE SUPP: 650.0000 mg | Freq: Four times a day (QID) | RECTAL | Status: DC | PRN

## 2022-12-19 MED ORDER — OXYCODONE HCL 5 MG PO TABS
5.0000 mg | ORAL_TABLET | ORAL | Status: DC | PRN
  Administered 2022-12-19 – 2022-12-21 (×10): 5 mg via ORAL
  Filled 2022-12-19 (×11): qty 1

## 2022-12-19 MED ORDER — ONDANSETRON HCL 4 MG/2ML IJ SOLN
4.0000 mg | Freq: Four times a day (QID) | INTRAMUSCULAR | Status: DC | PRN
  Administered 2022-12-19: 4 mg via INTRAVENOUS
  Filled 2022-12-19: qty 2

## 2022-12-19 MED ORDER — NICOTINE 14 MG/24HR TD PT24
14.0000 mg | MEDICATED_PATCH | Freq: Every day | TRANSDERMAL | Status: DC
  Administered 2022-12-19 – 2022-12-21 (×3): 14 mg via TRANSDERMAL
  Filled 2022-12-19 (×3): qty 1

## 2022-12-19 MED ORDER — MAGNESIUM SULFATE 2 GM/50ML IV SOLN
2.0000 g | Freq: Once | INTRAVENOUS | Status: AC
  Administered 2022-12-19: 2 g via INTRAVENOUS
  Filled 2022-12-19: qty 50

## 2022-12-19 MED ORDER — AMLODIPINE BESYLATE 5 MG PO TABS
5.0000 mg | ORAL_TABLET | Freq: Every day | ORAL | Status: DC
  Administered 2022-12-19 – 2022-12-21 (×3): 5 mg via ORAL
  Filled 2022-12-19 (×3): qty 1

## 2022-12-19 MED ORDER — POTASSIUM CHLORIDE 10 MEQ/100ML IV SOLN
10.0000 meq | INTRAVENOUS | Status: AC
  Administered 2022-12-19 (×3): 10 meq via INTRAVENOUS
  Filled 2022-12-19 (×3): qty 100

## 2022-12-19 MED ORDER — SODIUM CHLORIDE 0.9 % IV SOLN: INTRAVENOUS | Status: DC

## 2022-12-19 NOTE — Progress Notes (Signed)
patient has ns with 40 of potassium going at 132ml/hr in one iv and then ns going at 51ml/hr with potassium ysited in going in at 3ml/hr. patient states that both ivs are still burning. MD Tat notified.

## 2022-12-19 NOTE — Progress Notes (Signed)
Patient admitted to floor @ 0400. Ambulatory with steady gait, NPO diet patient requesting to be allowed to try food, educated patient on NPO status for admission diagnosis. Patient oriented x4, able to communicate needs resting quietly with call bell in reach.

## 2022-12-19 NOTE — Consult Note (Addendum)
Gastroenterology Consult   Referring Provider: No ref. provider found Primary Care Physician:  Patient, [name] Primary Gastroenterologist:  not established   Patient ID: Kaitlin Carpenter; [id number]; May 19, 1971   Admit date: 12/18/2022  LOS: 0 days   Date of Consultation: 12/19/2022  Reason for Consultation:  pancreatitis   History of Present Illness   Kaitlin Carpenter is a 52 y.o. year old female with history of tobacco use and alcohol abuse, and previous pancreatitis who presented to the ED with c/o abdominal pain, nausea, vomiting x4 days. Reportedly drinks wine 4-5 days/week, unable to quantify how much. Work up concerning for pancreatitis. GI consulted for further management   Temp 99.5 in ED, WBC 10, hgb 13.2AST 57, ALT 39, Lipase 204.  CT A/p with contrast showed pancreatic head enlargement with areas of fluid attenuation. stable when compared to prior scans. There is a small amount of peripancreatic stranding. hepatic steatosis and sigmoid diverticulosis.   started on IV fluids and pain meds   Consult: She reports she drinks maybe a few glasses of wine per day, unable to quantify sufficiently but suspects maybe a few bottles of wine per week. She denies beer or liquor. She does smoke, less than 1 PPD. Denies any illicit drug use. She reports she began with abdominal pain, nausea and vomiting about 5 days ago. Reports 3 episodes of pancreatitis in the past year. Mother died of pancreatic cancer, diagnosed around 48 or 62, notably her son has also had pancreatitis and does not smoke or drink. She denies any herbal supplements, otc meds or new prescribed medications. She was not tolerating liquids at all prior to admission though order for NPO she is drinking water and ginger ale on her bedside table and tolerating well. She is hungry. Nausea is under control. Abdominal pain is improving.    History reviewed. No pertinent past medical history.  Past Surgical History:  Procedure  Laterality Date   NECK SURGERY     SHOULDER SURGERY Left     Prior to Admission medications   Medication Sig Start Date End Date Taking? Authorizing Provider  HYDROcodone-acetaminophen (NORCO/VICODIN) 5-325 MG tablet Take 1 tablet by mouth every 6 (six) hours as needed for moderate pain or severe pain. Patient not taking: Reported on 09/06/2022 03/28/21   Vanessa Kick, MD  meloxicam (MOBIC) 15 MG tablet Take 1 tablet (15 mg total) by mouth daily. Patient not taking: Reported on 09/06/2022 03/28/21   Vanessa Kick, MD  metoCLOPramide (REGLAN) 10 MG tablet Take 1 tablet (10 mg total) by mouth every 6 (six) hours as needed for nausea. 10/09/22   Blanchie Dessert, MD  ondansetron (ZOFRAN) 4 MG tablet Take 1 tablet (4 mg total) by mouth every 6 (six) hours. Patient not taking: Reported on 09/06/2022 07/26/22   Prosperi, Christian H, PA-C  ondansetron (ZOFRAN-ODT) 4 MG disintegrating tablet Take 1 tablet (4 mg total) by mouth every 8 (eight) hours as needed. 09/06/22   Hendricks Limes, PA-C  oxyCODONE-acetaminophen (PERCOCET/ROXICET) 5-325 MG tablet Take 1 tablet by mouth every 6 (six) hours as needed for severe pain. 10/09/22   Blanchie Dessert, MD  predniSONE (DELTASONE) 10 MG tablet Take 2 tablets (20 mg total) by mouth daily. Patient not taking: Reported on 09/06/2022 04/07/21   Milton Ferguson, MD    Current Facility-Administered Medications  Medication Dose Route Frequency Provider Last Rate Last Admin   0.9 % NaCl with KCl 40 mEq / L  infusion   Intravenous Continuous Tat,  Shanon Brow, MD 125 mL/hr at 12/19/22 0926 New Bag at 12/19/22 0926   acetaminophen (TYLENOL) tablet 650 mg  650 mg Oral Q6H PRN Zierle-Ghosh, Asia B, DO       Or   acetaminophen (TYLENOL) suppository 650 mg  650 mg Rectal Q6H PRN Zierle-Ghosh, Asia B, DO       heparin injection 5,000 Units  5,000 Units Subcutaneous Q8H Zierle-Ghosh, Asia B, DO   5,000 Units at 12/19/22 0549   magnesium sulfate IVPB 2 g 50 mL  2 g Intravenous Once  Tat, Shanon Brow, MD 50 mL/hr at 12/19/22 0938 2 g at 12/19/22 [id number]   metoCLOPramide (REGLAN) tablet 10 mg  10 mg Oral Q6H PRN Zierle-Ghosh, Asia B, DO       morphine (PF) 2 MG/ML injection 2 mg  2 mg Intravenous Q2H PRN Zierle-Ghosh, Asia B, DO       nicotine (NICODERM CQ - dosed in mg/24 hours) patch 14 mg  14 mg Transdermal Daily Tat, David, MD   14 mg at 12/19/22 0938   ondansetron (ZOFRAN) tablet 4 mg  4 mg Oral Q6H PRN Zierle-Ghosh, Asia B, DO       Or   ondansetron (ZOFRAN) injection 4 mg  4 mg Intravenous Q6H PRN Zierle-Ghosh, Asia B, DO       oxyCODONE (Oxy IR/ROXICODONE) immediate release tablet 5 mg  5 mg Oral Q4H PRN Zierle-Ghosh, Asia B, DO   5 mg at 12/19/22 0935   potassium chloride 10 mEq in 100 mL IVPB  10 mEq Intravenous Q1 Hr x 3 Tat, David, MD 100 mL/hr at 12/19/22 0928 10 mEq at 12/19/22 [id number]   thiamine (VITAMIN B1) injection 100 mg  100 mg Intravenous Daily Tat, Shanon Brow, MD   100 mg at 12/19/22 [id number]    Allergies as of 12/18/2022   (No Known Allergies)    Family History  Family history unknown: Yes    Social History   Socioeconomic History   Marital status: Divorced    Spouse name: Not on file   Number of children: Not on file   Years of education: Not on file   Highest education level: Not on file  Occupational History   Not on file  Tobacco Use   Smoking status: Every Day    Packs/day: 1    Types: Cigarettes   Smokeless tobacco: Never  Vaping Use   Vaping Use: Never used  Substance and Sexual Activity   Alcohol use: Yes    Alcohol/week: 6.0 standard drinks of alcohol    Types: 6 Cans of beer per week   Drug use: Never   Sexual activity: Not Currently  Other Topics Concern   Not on file  Social History Narrative   Not on file   Social Determinants of Health   Financial Resource Strain: Not on file  Food Insecurity: No Food Insecurity (12/19/2022)   Hunger Vital Sign    Worried About Running Out of Food in the Last Year: Never true    Ran Out of Food  in the Last Year: Never true  Transportation Needs: No Transportation Needs (12/19/2022)   PRAPARE - Hydrologist (Medical): No    Lack of Transportation (Non-Medical): No  Physical Activity: Not on file  Stress: Not on file  Social Connections: Not on file  Intimate Partner Violence: Not At Risk (12/19/2022)   Humiliation, Afraid, Rape, and Kick questionnaire    Fear of Current or Ex-Partner: No  Emotionally Abused: No    Physically Abused: No    Sexually Abused: No     Review of Systems   Gen: Denies any fever, chills, loss of appetite, change in weight or weight loss CV: Denies chest pain, heart palpitations, syncope, edema  Resp: Denies shortness of breath with rest, cough, wheezing, coughing up blood, and pleurisy. GI: denies melena, hematochezia, iarrhea, constipation, dysphagia, odyonophagia, early satiety or weight loss. +abdominal pain, +nausea/vomiting  GU : Denies urinary burning, blood in urine, urinary frequency, and urinary incontinence. MS: Denies joint pain, limitation of movement, swelling, cramps, and atrophy.  Derm: Denies rash, itching, dry skin, hives. Psych: Denies depression, anxiety, memory loss, hallucinations, and confusion. Heme: Denies bruising or bleeding Neuro:  Denies any headaches, dizziness, paresthesias, shaking  Physical Exam   Vital Signs in last 24 hours: Temp:  [97.7 F (36.5 C)-99.5 F (37.5 C)] 99.5 F (37.5 C) (03/20 0405) Pulse Rate:  [53-81] 71 (03/20 0405) Resp:  [18-20] 20 (03/20 0405) BP: (151-187)/(77-113) 157/98 (03/20 0405) SpO2:  [94 %-100 %] 98 % (03/20 0405) Weight:  [53.8 kg-59 kg] 53.8 kg (03/20 0405) Last BM Date : 12/17/22  General:   Alert,  Well-developed, well-nourished, pleasant and cooperative in NAD Head:  Normocephalic and atraumatic. Eyes:  Sclera clear, no icterus.   Conjunctiva pink. Ears:  Normal auditory acuity. Mouth:  No deformity or lesions, dentition normal. Neck:   Supple; no masses Lungs:  Clear throughout to auscultation.   No wheezes, crackles, or rhonchi. No acute distress. Heart:  Regular rate and rhythm; no murmurs, clicks, rubs,  or gallops. Abdomen:  Soft, nontender and nondistended. No masses, hepatosplenomegaly or hernias noted. Normal bowel sounds, without guarding, and without rebound.   Msk:  Symmetrical without gross deformities. Normal posture. Extremities:  Without clubbing or edema. Neurologic:  Alert and  oriented x4. Skin:  Intact without significant lesions or rashes. Psych:  Alert and cooperative. Normal mood and affect.   Labs/Studies   Recent Labs Recent Labs    12/18/22 1940 12/19/22 0443  WBC 8.2 10.0  HGB 17.1* 13.2  HCT 48.6* 37.4  PLT 252 179   BMET Recent Labs    12/18/22 1747 12/19/22 0443  NA 134* 132*  K 3.7 3.1*  CL 93* 96*  CO2 25 27  GLUCOSE 119* 88  BUN 28* 23*  CREATININE 0.77 0.65  CALCIUM 9.3 8.2*   LFT Recent Labs    12/18/22 1747 12/19/22 0443  PROT 9.2* 6.5  ALBUMIN 4.7 3.3*  AST 82* 57*  ALT 62* 39  ALKPHOS 62 41  BILITOT 1.2 1.1   PT/INR No results for input(s): "LABPROT", "INR" in the last 72 hours. Hepatitis Panel No results for input(s): "HEPBSAG", "HCVAB", "HEPAIGM", "HEPBIGM" in the last 72 hours. C-Diff No results for input(s): "CDIFFTOX" in the last 72 hours.  Radiology/Studies US Abdomen Limited RUQ (LIVER/GB)  Result Date: 12/19/2022 CLINICAL DATA:  6216 Acute pancreatitis 6216 EXAM: ULTRASOUND ABDOMEN LIMITED RIGHT UPPER QUADRANT COMPARISON:  CT abdomen pelvis 12/18/2022, CT abdomen pelvis 07/19/2018 FINDINGS: Gallbladder: No definite gallbladder sludge with suggestion of luminal artifact. No gallstones or wall thickening visualized. No pericholecystic fluid. No sonographic Murphy sign noted by sonographer. Common bile duct: Diameter: 4 mm. Liver: No focal lesion identified. Within normal limits in parenchymal echogenicity. Portal vein is patent on color Doppler  imaging with normal direction of blood flow towards the liver. Other: None. IMPRESSION: Unremarkable right upper quadrant ultrasound. Electronically Signed   By: Iven Finn  M.D.   On: 12/19/2022 09:33   CT ABDOMEN PELVIS W CONTRAST  Result Date: 12/18/2022 CLINICAL DATA:  Nausea, vomiting and diarrhea. EXAM: CT ABDOMEN AND PELVIS WITH CONTRAST TECHNIQUE: Multidetector CT imaging of the abdomen and pelvis was performed using the standard protocol following bolus administration of intravenous contrast. RADIATION DOSE REDUCTION: This exam was performed according to the departmental dose-optimization program which includes automated exposure control, adjustment of the mA and/or kV according to patient size and/or use of iterative reconstruction technique. CONTRAST:  158mL OMNIPAQUE IOHEXOL 300 MG/ML  SOLN COMPARISON:  July 26, 2022 FINDINGS: Lower chest: A 5 mm calcified lung nodule is seen within the posterior aspect of the right lung base. Hepatobiliary: There is diffuse fatty infiltration of the liver parenchyma. No focal liver abnormality is seen. No gallstones, gallbladder wall thickening, or biliary dilatation. Pancreas: The head of the pancreas is enlarged, with areas of fluid attenuation seen within the adjacent pancreaticoduodenal groove. This is stable in size and appearance when compared to the prior study. A mild amount of associated peripancreatic inflammatory fat stranding is noted. No pancreatic ductal dilatation is seen. Spleen: Normal in size without focal abnormality. Adrenals/Urinary Tract: Adrenal glands are unremarkable. Kidneys are normal, without obstructing renal calculi, focal lesion, or hydronephrosis. A 4 mm nonobstructing renal calculus is seen within the lower pole of the right kidney. The urinary bladder is poorly distended and subsequently limited in evaluation. Stomach/Bowel: Stomach is within normal limits. Appendix appears normal. No evidence of bowel dilatation. The  thickened decompressed transverse colon and descending colon is seen. A small diverticula are seen within the proximal sigmoid colon. Vascular/Lymphatic: Aortic atherosclerosis. Subcentimeter para-aortic and aortocaval lymph nodes are seen. Reproductive: Uterus and bilateral adnexa are unremarkable. Other: No abdominal wall hernia or abnormality. No abdominopelvic ascites. Musculoskeletal: Marked severity degenerative changes seen at the level of L5-S1. IMPRESSION: 1. Pancreatic head enlargement with additional findings consistent with mild acute pancreatitis. Correlation with pancreatic enzymes and MRI follow-up is recommended, as an underlying neoplastic process cannot be excluded. 2. Hepatic steatosis. 3. 4 mm nonobstructing right renal calculus. 4. Sigmoid diverticulosis. 5. Marked severity degenerative changes at the level of L5-S1. 6. Aortic atherosclerosis. Aortic Atherosclerosis (ICD10-I70.0). Electronically Signed   By: Virgina Norfolk M.D.   On: 12/18/2022 23:36     Assessment   Kaitlin Carpenter is a 52 y.o. year old female  with history of tobacco use and alcohol abuse, and previous pancreatitis who presented to the ED with c/o abdominal pain, nausea, vomiting x4 days, reportedly drinking wine multiple days a week, workup concerning for pancreatitis. GI consulted for further management.  Abdominal pain/nausea/vomiting: lipase elevated at 158, 204 today, CT A/P with pancreatic head enlargement with areas of fluid attenuation seen within the adjacent pancreaticoduodenal groove-stable in size and appearance when compared to the prior study. mild amount of associated peripancreatic inflammatory fat stranding is noted. No pancreatic ductal dilatation is seen. RUQ [place] this morning was unremarkable. T bili is normal and mild elevation of aminotransferases which are trending down.   She has had recurrent episodes of pancreatitis, She does smoke and notably drinks alcohol 4-5 days a week, potentially  influencing this. triglycerides normal. Given recurrent pancreatitis will check IgG4 to rule out autoimmune component, at this time, Differentials include pancreatitis secondary to malignancy, autoimmune pancreatitis or acute pancreatitis secondary to ETOH/hereditary component.  Would certainly recommend MRI imaging of pancreas in about 10-12 weeks to rule out underlying malignancy. For now will continue with clear liquids, anti  emetics and pain management, ideally needs to stop ETOH and tobacco.   Plan / Recommendations   Repeat imaging with MRI in 10-12 weeks  Clear liquids Anti emetics and pain meds per hospitalist Alcohol and smoking cessation 5. IgG4    12/19/2022, 10:09 AM  Chelsea L. Alver Sorrow, MSN, APRN, AGNP-C Adult-Gerontology Nurse Practitioner somewhat [facility] at [facility]  Attending note: Patient seen and examined this afternoon.  This lady does appear to have uncomplicated pancreatitis.  Head of the pancreas does look somewhat "Beefy"/unusual.  Agree with considering autoimmune etiology even though there is a significant EtOH history to go along with pancreatitis.  We need to keep in mind family history of pancreatitis and cancer. Agree with our initial plan for evaluation as outlined.

## 2022-12-19 NOTE — Hospital Course (Addendum)
52 year old female with a history of tobacco abuse, alcohol dependence, pancreatitis presenting with 4-day history of upper abdominal pain with nausea and vomiting.  The patient states that she drinks wine about 4 to 5 days/week, but is unable to quantify how much during each sitting.  She states that she is unable to keep anything down secondary to her nausea and vomiting.  She had worsening abdominal pain.  She denies any fevers, chills, chest pain, shortness breath, hemoptysis.  There is no hematemesis.  She denies diarrhea or hematochezia but states that she has been having occasional melena for the past week.  She denies any illicit drug use. In the ED, the patient had low-grade temperature 99.5 F.  She was hemodynamically stable with oxygen saturation 98% on room air.  WBC 10.0, hemoglobin 13.2, platelets 179,000.  Sodium 132, potassium 3.1, bicarbonate 27, serum creatinine 0.65.  AST 57, ALT 39, alk phosphatase 41, total bilirubin 1.1, lipase 204.  CT of the abdomen pelvis showed pancreatic head enlargement with areas of fluid attenuation.  These are stable when compared to prior scans.  There is a small amount of peripancreatic stranding.  There was a nonobstructive right renal calculus.  There is hepatic steatosis and sigmoid diverticulosis.  The patient was started on IV fluids and IV opioids.  GI was consulted to assist with management.

## 2022-12-19 NOTE — H&P (Signed)
History and Physical    Patient: Kaitlin Carpenter [id number] DOB: [birth year] DOA: 12/18/2022 DOS: the patient was seen and examined on 12/19/2022 PCP: Patient, [name]  Patient coming from: Home  Chief Complaint:  Chief Complaint  Patient presents with   Abdominal Pain   HPI: Kaitlin Carpenter is a 52 year old female with a history of tobacco abuse, alcohol dependence, pancreatitis presenting with 4-day history of upper abdominal pain with nausea and vomiting.  The patient states that she drinks wine about 4 to 5 days/week, but is unable to quantify how much during each sitting.  She states that she is unable to keep anything down secondary to her nausea and vomiting.  She had worsening abdominal pain.  She denies any fevers, chills, chest pain, shortness breath, hemoptysis.  There is no hematemesis.  She denies diarrhea or hematochezia but states that she has been having occasional melena for the past week.  She denies any illicit drug use. In the ED, the patient had low-grade temperature 99.5 F.  She was hemodynamically stable with oxygen saturation 98% on room air.  WBC 10.0, hemoglobin 13.2, platelets 179,000.  Sodium 132, potassium 3.1, bicarbonate 27, serum creatinine 0.65.  AST 57, ALT 39, alk phosphatase 41, total bilirubin 1.1, lipase 204.  CT of the abdomen pelvis showed pancreatic head enlargement with areas of fluid attenuation.  These are stable when compared to prior scans.  There is a small amount of peripancreatic stranding.  There was a nonobstructive right renal calculus.  There is hepatic steatosis and sigmoid diverticulosis.  The patient was started on IV fluids and IV opioids.  GI was consulted to assist with management.  Review of Systems: As mentioned in the history of present illness. All other systems reviewed and are negative. History reviewed. No pertinent past medical history. Past Surgical History:  Procedure Laterality Date   NECK SURGERY     SHOULDER SURGERY Left     Social History:  reports that she has been smoking cigarettes. She has been smoking an average of 1 pack per day. She has never used smokeless tobacco. She reports current alcohol use of about 6.0 standard drinks of alcohol per week. She reports that she does not use drugs.  No Known Allergies  Family History  Family history unknown: Yes    Prior to Admission medications   Medication Sig Start Date End Date Taking? Authorizing Provider  HYDROcodone-acetaminophen (NORCO/VICODIN) 5-325 MG tablet Take 1 tablet by mouth every 6 (six) hours as needed for moderate pain or severe pain. Patient not taking: Reported on 09/06/2022 03/28/21   Vanessa Kick, MD  meloxicam (MOBIC) 15 MG tablet Take 1 tablet (15 mg total) by mouth daily. Patient not taking: Reported on 09/06/2022 03/28/21   Vanessa Kick, MD  metoCLOPramide (REGLAN) 10 MG tablet Take 1 tablet (10 mg total) by mouth every 6 (six) hours as needed for nausea. 10/09/22   Blanchie Dessert, MD  ondansetron (ZOFRAN) 4 MG tablet Take 1 tablet (4 mg total) by mouth every 6 (six) hours. Patient not taking: Reported on 09/06/2022 07/26/22   Prosperi, Christian H, PA-C  ondansetron (ZOFRAN-ODT) 4 MG disintegrating tablet Take 1 tablet (4 mg total) by mouth every 8 (eight) hours as needed. 09/06/22   Hendricks Limes, PA-C  oxyCODONE-acetaminophen (PERCOCET/ROXICET) 5-325 MG tablet Take 1 tablet by mouth every 6 (six) hours as needed for severe pain. 10/09/22   Blanchie Dessert, MD  predniSONE (DELTASONE) 10 MG tablet Take 2 tablets (20 mg  total) by mouth daily. Patient not taking: Reported on 09/06/2022 04/07/21   Milton Ferguson, MD    Physical Exam: Vitals:   12/18/22 2130 12/19/22 0245 12/19/22 0330 12/19/22 0405  BP: (!) 158/77 (!) 161/91 (!) 151/84 (!) 157/98  Pulse: 64 (!) 53 (!) 56 71  Resp:  18 18 20   Temp: 98.3 F (36.8 C)   99.5 F (37.5 C)  TempSrc:    Oral  SpO2: 98% 97% 94% 98%  Weight:    53.8 kg  Height:    5\' 4"  (1.626 m)    GENERAL:  A&O x 3, NAD, well developed, cooperative, follows commands HEENT: [place]/AT, No thrush, No icterus, No oral ulcers Neck:  No neck mass, No meningismus, soft, supple CV: RRR, no S3, no S4, no rub, no JVD Lungs:  diminished BS but CTA, no wheeze, no rhonchi, good air movement Abd: soft/upper abd +BS, nondistended Ext: No edema, no lymphangitis, no cyanosis, no rashes Neuro:  CN II-XII intact, strength 4/5 in RUE, RLE, strength 4/5 LUE, LLE; sensation intact bilateral; no dysmetria; babinski equivocal  Data Reviewed: Data reviewed above in history  Assessment and Plan: Acute pancreatitis/pancreatic head enlargement -GI consult -IV fluids -Judicious opioids -check lipids -RUQ ultrasound -npo for now with sips -UDS  Pyuria -UA 11-20 WBC -Obtain urine culture  Tobacco abuse -Tobacco cessation discussed  Hypokalemia -Replete -Check museum  Hyponatremia -Secondary to volume depletion and poor solute intake -Continue IV fluids   Advance Care Planning: FULL CODE  Consults: GI  Family Communication: none  Severity of Illness: The appropriate patient status for this patient is OBSERVATION. Observation status is judged to be reasonable and necessary in order to provide the required intensity of service to ensure the patient's safety. The patient's presenting symptoms, physical exam findings, and initial radiographic and laboratory data in the context of their medical condition is felt to place them at decreased risk for further clinical deterioration. Furthermore, it is anticipated that the patient will be medically stable for discharge from the hospital within 2 midnights of admission.   Author: Orson Eva, MD 12/19/2022 7:53 AM  For on call review [email or web address].

## 2022-12-19 NOTE — ED Provider Notes (Signed)
  Physical Exam  BP (!) 161/91   Pulse (!) 53   Temp 98.3 F (36.8 C)   Resp 18   Ht 5\' 4"  (1.626 m)   Wt 59 kg   LMP 01/05/2021 (Within Days) Comment: Pt has not been active in a year and pt states she's not getting periods anymore.  SpO2 97%   BMI 22.31 kg/m   Physical Exam Vitals and nursing note reviewed.  Constitutional:      General: She is not in acute distress.    Appearance: She is well-developed. She is not diaphoretic.  HENT:     Head: Normocephalic and atraumatic.  Cardiovascular:     Rate and Rhythm: Normal rate and regular rhythm.     Heart sounds: No murmur heard.    No friction rub. No gallop.  Pulmonary:     Effort: Pulmonary effort is normal. No respiratory distress.     Breath sounds: Normal breath sounds. No wheezing.  Abdominal:     General: Bowel sounds are normal. There is no distension.     Palpations: Abdomen is soft.     Tenderness: There is abdominal tenderness in the epigastric area.  Musculoskeletal:        General: Normal range of motion.     Cervical back: Normal range of motion and neck supple.  Skin:    General: Skin is warm and dry.  Neurological:     General: No focal deficit present.     Mental Status: She is alert and oriented to person, place, and time.     Procedures  Procedures  ED Course / MDM    Medical Decision Making Amount and/or Complexity of Data Reviewed Labs: ordered. Radiology: ordered.  Risk Prescription drug management. Decision regarding hospitalization.   Care assumed from Dr. Doren Custard at shift change.  Patient awaiting reassessment after receiving pain medication and IV fluids.  She has history of pancreatitis and presents today with a flareup of this.  Patient reports that she is in too much pain to go home.  She has received repeat doses of Dilaudid and IV fluids.  Patient to be admitted to the hospitalist service.       Veryl Speak, MD 12/19/22 [phone]

## 2022-12-19 NOTE — Progress Notes (Signed)
   12/19/22 1408  Vitals  Temp 98.3 F (36.8 C)  Temp Source Oral  BP (!) 180/93  MAP (mmHg) 116  BP Location Left Arm  BP Method Automatic  Patient Position (if appropriate) Sitting  Pulse Rate 79  Pulse Rate Source Monitor  Resp 18  MEWS COLOR  MEWS Score Color Green  Oxygen Therapy  SpO2 100 %  MEWS Score  MEWS Temp 0  MEWS Systolic 0  MEWS Pulse 0  MEWS RR 0  MEWS LOC 0  MEWS Score 0   MD Tat notified and bp rechecked. Bp now 147/98

## 2022-12-20 DIAGNOSIS — Z720 Tobacco use: Secondary | ICD-10-CM | POA: Diagnosis not present

## 2022-12-20 DIAGNOSIS — K859 Acute pancreatitis without necrosis or infection, unspecified: Secondary | ICD-10-CM | POA: Diagnosis not present

## 2022-12-20 DIAGNOSIS — K8500 Idiopathic acute pancreatitis without necrosis or infection: Secondary | ICD-10-CM

## 2022-12-20 DIAGNOSIS — F1010 Alcohol abuse, uncomplicated: Secondary | ICD-10-CM | POA: Diagnosis not present

## 2022-12-20 DIAGNOSIS — R109 Unspecified abdominal pain: Secondary | ICD-10-CM

## 2022-12-20 DIAGNOSIS — R112 Nausea with vomiting, unspecified: Secondary | ICD-10-CM

## 2022-12-20 DIAGNOSIS — E871 Hypo-osmolality and hyponatremia: Secondary | ICD-10-CM | POA: Diagnosis not present

## 2022-12-20 DIAGNOSIS — E876 Hypokalemia: Secondary | ICD-10-CM | POA: Diagnosis not present

## 2022-12-20 LAB — COMPREHENSIVE METABOLIC PANEL
ALT: 35 U/L (ref 0–44)
AST: 46 U/L — ABNORMAL HIGH (ref 15–41)
Albumin: 3.5 g/dL (ref 3.5–5.0)
Alkaline Phosphatase: 42 U/L (ref 38–126)
Anion gap: 10 (ref 5–15)
BUN: 10 mg/dL (ref 6–20)
CO2: 24 mmol/L (ref 22–32)
Calcium: 8.1 mg/dL — ABNORMAL LOW (ref 8.9–10.3)
Chloride: 101 mmol/L (ref 98–111)
Creatinine, Ser: 0.56 mg/dL (ref 0.44–1.00)
GFR, Estimated: >60 mL/min (ref >60)
Glucose, Bld: 74 mg/dL (ref 70–99)
Potassium: 3.3 mmol/L — ABNORMAL LOW (ref 3.5–5.1)
Sodium: 135 mmol/L (ref 135–145)
Total Bilirubin: 1.1 mg/dL (ref 0.3–1.2)
Total Protein: 6.9 g/dL (ref 6.5–8.1)

## 2022-12-20 LAB — MAGNESIUM: Magnesium: 2 mg/dL (ref 1.7–2.4)

## 2022-12-20 MED ORDER — POTASSIUM CHLORIDE CRYS ER 20 MEQ PO TBCR
20.0000 meq | EXTENDED_RELEASE_TABLET | Freq: Once | ORAL | Status: AC
  Administered 2022-12-20: 20 meq via ORAL
  Filled 2022-12-20: qty 1

## 2022-12-20 NOTE — Discharge Instructions (Signed)
  Providers Accepting New Patients in [place], [place]    [facility] [address]  [place], [place] [address] [phone] Accepts most insurances  [facility] [address] [place], [place] [address] [phone] Accepts most insurances  [facility] [place] [address] [place], [place] [address]  [phone] Must meet requirements  [facility] [address]  [place], [place] [address] [phone] Accepts most insurances  [facility] [address]  [place], [place] [address] [phone] Accepts most insurances  [facility] [address]   [place], [place]   [phone] Accepts most insurances  [facility] ([facility])  [address]  [place], [place] [address] [phone] Accepts most insurances     [facility] [address]  [place], [place] [address] [phone] Accepts most insurances  [facility] [facility] [address] [place], [place] [address] [phone] option 1 Accepts Medicaid and Uninsured  [facility] [address]  [place], [place] [address] [phone] Accepts most insurances  Tesfaye Fanta, MD [address]  [place], [place] [address] [phone] Accepts most insurances  [facility] [place] [address]  [place], [place] [address] [phone] Accepts most insurances  [facility] [address] [place],  [address] [phone] Accepts most insurances  Zack Hall, MD [address] [place],  [address] [phone]  Accepts most insurances                    

## 2022-12-20 NOTE — Progress Notes (Addendum)
PROGRESS NOTE  Kaitlin Carpenter [id number] DOB: 1971-02-13 DOA: 12/18/2022 PCP: Patient, [name]  Brief History:  52 year old female with a history of tobacco abuse, alcohol dependence, pancreatitis presenting with 4-day history of upper abdominal pain with nausea and vomiting.  The patient states that she drinks wine about 4 to 5 days/week, but is unable to quantify how much during each sitting.  She states that she is unable to keep anything down secondary to her nausea and vomiting.  She had worsening abdominal pain.  She denies any fevers, chills, chest pain, shortness breath, hemoptysis.  There is no hematemesis.  She denies diarrhea or hematochezia but states that she has been having occasional melena for the past week.  She denies any illicit drug use. In the ED, the patient had low-grade temperature 99.5 F.  She was hemodynamically stable with oxygen saturation 98% on room air.  WBC 10.0, hemoglobin 13.2, platelets 179,000.  Sodium 132, potassium 3.1, bicarbonate 27, serum creatinine 0.65.  AST 57, ALT 39, alk phosphatase 41, total bilirubin 1.1, lipase 204.  CT of the abdomen pelvis showed pancreatic head enlargement with areas of fluid attenuation.  These are stable when compared to prior scans.  There is a small amount of peripancreatic stranding.  There was a nonobstructive right renal calculus.  There is hepatic steatosis and sigmoid diverticulosis.  The patient was started on IV fluids and IV opioids.  GI was consulted to assist with management.   Assessment/Plan: Acute pancreatitis/pancreatic head enlargement -GI consult appreciated>>MRI pancreas in 10-12 weeks -cannot rule out autoimmune pancreatitis -IV fluids>>continue -Judicious opioids -check lipids--triglycerides 53 -RUQ ultrasound--neg -npo for now with sips>>clears>>full liquids>>soft diet -UDS positive opiates   Pyuria -UA 11-20 WBC -repeat UA without pyuria   Tobacco abuse -Tobacco cessation  discussed -nicoderm patch   Hypokalemia -Replete -Check mag 2.0   Hyponatremia -Secondary to volume depletion and poor solute intake -Continue IV fluids>>improving          Family Communication:  no Family at bedside  Consultants:  GI  Code Status:  FULL   DVT Prophylaxis:  [place] Heparin  Procedures: As Listed in Progress Note Above  Antibiotics: None       Subjective: Pt states abd pain is 50% better.  Denies f/c, cp, sob, n/v/d  Objective: Vitals:   12/19/22 1550 12/19/22 2023 12/20/22 0401 12/20/22 1349  BP: (!) 147/98 (!) 154/101 (!) 150/103 (!) 140/88  Pulse: 72 76 73 65  Resp:  18 18 16   Temp:  98.4 F (36.9 C) 97.9 F (36.6 C) 98.7 F (37.1 C)  TempSrc:    Oral  SpO2:  99% 93% 99%  Weight:      Height:        Intake/Output Summary (Last 24 hours) at 12/20/2022 1717 Last data filed at 12/20/2022 1300 Gross per 24 hour  Intake 720 ml  Output --  Net 720 ml   Weight change:  Exam:  General:  Pt is alert, follows commands appropriately, not in acute distress HEENT: No icterus, No thrush, No neck mass, [place]/AT Cardiovascular: RRR, S1/S2, no rubs, no gallops Respiratory: CTA bilaterally, no wheezing, no crackles, no rhonchi Abdomen: Soft/+BS, epigastric tender, non distended, no guarding Extremities: No edema, No lymphangitis, No petechiae, No rashes, no synovitis   Data Reviewed: I have personally reviewed following labs and imaging studies Basic Metabolic Panel: Recent Labs  Lab 12/18/22 1747 12/19/22 0443 12/20/22 0432  NA 134*  132* 135  K 3.7 3.1* 3.3*  CL 93* 96* 101  CO2 25 27 24   GLUCOSE 119* 88 74  BUN 28* 23* 10  CREATININE 0.77 0.65 0.56  CALCIUM 9.3 8.2* 8.1*  MG  --  1.7 2.0   Liver Function Tests: Recent Labs  Lab 12/18/22 1747 12/19/22 0443 12/20/22 0432  AST 82* 57* 46*  ALT 62* 39 35  ALKPHOS 62 41 42  BILITOT 1.2 1.1 1.1  PROT 9.2* 6.5 6.9  ALBUMIN 4.7 3.3* 3.5   Recent Labs  Lab 12/18/22 1747  12/19/22 0443  LIPASE 158* 204*   No results for input(s): "AMMONIA" in the last 168 hours. Coagulation Profile: No results for input(s): "INR", "PROTIME" in the last 168 hours. CBC: Recent Labs  Lab 12/18/22 1940 12/19/22 0443  WBC 8.2 10.0  NEUTROABS  --  6.0  HGB 17.1* 13.2  HCT 48.6* 37.4  MCV 103.2* 102.7*  PLT 252 179   Cardiac Enzymes: No results for input(s): "CKTOTAL", "CKMB", "CKMBINDEX", "TROPONINI" in the last 168 hours. BNP: Invalid input(s): "POCBNP" CBG: No results for input(s): "GLUCAP" in the last 168 hours. HbA1C: No results for input(s): "HGBA1C" in the last 72 hours. Urine analysis:    Component Value Date/Time   COLORURINE YELLOW 12/19/2022 1057   APPEARANCEUR CLEAR 12/19/2022 1057   LABSPEC 1.016 12/19/2022 1057   PHURINE 7.0 12/19/2022 1057   GLUCOSEU NEGATIVE 12/19/2022 1057   HGBUR SMALL (A) 12/19/2022 1057   BILIRUBINUR NEGATIVE 12/19/2022 1057   KETONESUR 20 (A) 12/19/2022 1057   PROTEINUR NEGATIVE 12/19/2022 1057   NITRITE NEGATIVE 12/19/2022 1057   LEUKOCYTESUR NEGATIVE 12/19/2022 1057   Sepsis Labs: @LABRCNTIP (procalcitonin:4,lacticidven:4) )No results found for this or any previous visit (from the past 240 hour(s)).   Scheduled Meds:  amLODipine  5 mg Oral Daily   heparin  5,000 Units Subcutaneous Q8H   nicotine  14 mg Transdermal Daily   thiamine (VITAMIN B1) injection  100 mg Intravenous Daily   Continuous Infusions:  0.9 % NaCl with KCl 20 mEq / L 125 mL/hr at 12/20/22 1258    Procedures/Studies: US Abdomen Limited RUQ (LIVER/GB)  Result Date: 12/19/2022 CLINICAL DATA:  6216 Acute pancreatitis 6216 EXAM: ULTRASOUND ABDOMEN LIMITED RIGHT UPPER QUADRANT COMPARISON:  CT abdomen pelvis 12/18/2022, CT abdomen pelvis 07/19/2018 FINDINGS: Gallbladder: No definite gallbladder sludge with suggestion of luminal artifact. No gallstones or wall thickening visualized. No pericholecystic fluid. No sonographic Murphy sign noted by  sonographer. Common bile duct: Diameter: 4 mm. Liver: No focal lesion identified. Within normal limits in parenchymal echogenicity. Portal vein is patent on color Doppler imaging with normal direction of blood flow towards the liver. Other: None. IMPRESSION: Unremarkable right upper quadrant ultrasound. Electronically Signed   By: Iven Finn M.D.   On: 12/19/2022 09:33   CT ABDOMEN PELVIS W CONTRAST  Result Date: 12/18/2022 CLINICAL DATA:  Nausea, vomiting and diarrhea. EXAM: CT ABDOMEN AND PELVIS WITH CONTRAST TECHNIQUE: Multidetector CT imaging of the abdomen and pelvis was performed using the standard protocol following bolus administration of intravenous contrast. RADIATION DOSE REDUCTION: This exam was performed according to the departmental dose-optimization program which includes automated exposure control, adjustment of the mA and/or kV according to patient size and/or use of iterative reconstruction technique. CONTRAST:  142mL OMNIPAQUE IOHEXOL 300 MG/ML  SOLN COMPARISON:  July 26, 2022 FINDINGS: Lower chest: A 5 mm calcified lung nodule is seen within the posterior aspect of the right lung base. Hepatobiliary: There is diffuse fatty infiltration  of the liver parenchyma. No focal liver abnormality is seen. No gallstones, gallbladder wall thickening, or biliary dilatation. Pancreas: The head of the pancreas is enlarged, with areas of fluid attenuation seen within the adjacent pancreaticoduodenal groove. This is stable in size and appearance when compared to the prior study. A mild amount of associated peripancreatic inflammatory fat stranding is noted. No pancreatic ductal dilatation is seen. Spleen: Normal in size without focal abnormality. Adrenals/Urinary Tract: Adrenal glands are unremarkable. Kidneys are normal, without obstructing renal calculi, focal lesion, or hydronephrosis. A 4 mm nonobstructing renal calculus is seen within the lower pole of the right kidney. The urinary bladder is  poorly distended and subsequently limited in evaluation. Stomach/Bowel: Stomach is within normal limits. Appendix appears normal. No evidence of bowel dilatation. The thickened decompressed transverse colon and descending colon is seen. A small diverticula are seen within the proximal sigmoid colon. Vascular/Lymphatic: Aortic atherosclerosis. Subcentimeter para-aortic and aortocaval lymph nodes are seen. Reproductive: Uterus and bilateral adnexa are unremarkable. Other: No abdominal wall hernia or abnormality. No abdominopelvic ascites. Musculoskeletal: Marked severity degenerative changes seen at the level of L5-S1. IMPRESSION: 1. Pancreatic head enlargement with additional findings consistent with mild acute pancreatitis. Correlation with pancreatic enzymes and MRI follow-up is recommended, as an underlying neoplastic process cannot be excluded. 2. Hepatic steatosis. 3. 4 mm nonobstructing right renal calculus. 4. Sigmoid diverticulosis. 5. Marked severity degenerative changes at the level of L5-S1. 6. Aortic atherosclerosis. Aortic Atherosclerosis (ICD10-I70.0). Electronically Signed   By: Virgina Norfolk M.D.   On: 12/18/2022 23:36    Orson Eva, DO  [facility]  If 7PM-7AM, please contact night-coverage [email or web address] Password [facility] 12/20/2022, 5:17 PM   LOS: 1 day

## 2022-12-20 NOTE — Progress Notes (Signed)
Patient c/o abdominal pain x4 during this shift. No n/v during this shift. Patient states that she is ready to eat solid foods.

## 2022-12-20 NOTE — Progress Notes (Signed)
Gastroenterology Progress Note   Referring Provider: No ref. provider found Primary Care Physician:  Patient, [name] Primary Gastroenterologist:  previously unassigned, Dr. Gala Romney  Patient ID: Kaitlin Carpenter; JO:[id number]; 09/05/1971   Subjective:    Last vomited two days ago. Diarrhea yesterday. States symptoms ongoing for nearly two weeks with 16 pound weight loss. States son has chronic pancreatitis, first issues at age 53, now 76. Believes he has autoimmune pancreatitis but she is not sure.   Objective:   Vital signs in last 24 hours: Temp:  [97.9 F (36.6 C)-98.4 F (36.9 C)] 97.9 F (36.6 C) (03/21 0401) Pulse Rate:  [72-79] 73 (03/21 0401) Resp:  [18] 18 (03/21 0401) BP: (147-180)/(93-103) 150/103 (03/21 0401) SpO2:  [93 %-100 %] 93 % (03/21 0401) Last BM Date : 12/19/22 General:   Alert, thin, pleasant and cooperative in NAD Head:  Normocephalic and atraumatic. Eyes:  Sclera clear, no icterus.   Abdomen:  Soft,  nondistended.  Mild epigastric tenderness. Normal bowel sounds, without guarding, and without rebound.   Extremities:  Without clubbing, deformity or edema. Neurologic:  Alert and  oriented x4;  grossly normal neurologically. Skin:  Intact without significant lesions or rashes. Psych:  Alert and cooperative. Normal mood and affect.  Intake/Output from previous day: 03/20 0701 - 03/21 0700 In: 1311.3 [P.O.:480; I.V.:558.4; IV Piggyback:272.9] Out: -  Intake/Output this shift: No intake/output data recorded.  Lab Results: CBC Recent Labs    12/18/22 1940 12/19/22 0443  WBC 8.2 10.0  HGB 17.1* 13.2  HCT 48.6* 37.4  MCV 103.2* 102.7*  PLT 252 179   BMET Recent Labs    12/18/22 1747 12/19/22 0443 12/20/22 0432  NA 134* 132* 135  K 3.7 3.1* 3.3*  CL 93* 96* 101  CO2 25 27 24   GLUCOSE 119* 88 74  BUN 28* 23* 10  CREATININE 0.77 0.65 0.56  CALCIUM 9.3 8.2* 8.1*   LFTs Recent Labs    12/18/22 1747 12/19/22 0443 12/20/22 0432  BILITOT  1.2 1.1 1.1  ALKPHOS 62 41 42  AST 82* 57* 46*  ALT 62* 39 35  PROT 9.2* 6.5 6.9  ALBUMIN 4.7 3.3* 3.5   Recent Labs    12/18/22 1747 12/19/22 0443  LIPASE 158* 204*   PT/INR No results for input(s): "LABPROT", "INR" in the last 72 hours.     No results found for: "VITAMINB12" No results found for: "FOLATE"   Imaging Studies: US Abdomen Limited RUQ (LIVER/GB)  Result Date: 12/19/2022 CLINICAL DATA:  6216 Acute pancreatitis 6216 EXAM: ULTRASOUND ABDOMEN LIMITED RIGHT UPPER QUADRANT COMPARISON:  CT abdomen pelvis 12/18/2022, CT abdomen pelvis 07/19/2018 FINDINGS: Gallbladder: No definite gallbladder sludge with suggestion of luminal artifact. No gallstones or wall thickening visualized. No pericholecystic fluid. No sonographic Murphy sign noted by sonographer. Common bile duct: Diameter: 4 mm. Liver: No focal lesion identified. Within normal limits in parenchymal echogenicity. Portal vein is patent on color Doppler imaging with normal direction of blood flow towards the liver. Other: None. IMPRESSION: Unremarkable right upper quadrant ultrasound. Electronically Signed   By: Iven Finn M.D.   On: 12/19/2022 09:33   CT ABDOMEN PELVIS W CONTRAST  Result Date: 12/18/2022 CLINICAL DATA:  Nausea, vomiting and diarrhea. EXAM: CT ABDOMEN AND PELVIS WITH CONTRAST TECHNIQUE: Multidetector CT imaging of the abdomen and pelvis was performed using the standard protocol following bolus administration of intravenous contrast. RADIATION DOSE REDUCTION: This exam was performed according to the departmental dose-optimization program which includes automated  exposure control, adjustment of the mA and/or kV according to patient size and/or use of iterative reconstruction technique. CONTRAST:  167mL OMNIPAQUE IOHEXOL 300 MG/ML  SOLN COMPARISON:  July 26, 2022 FINDINGS: Lower chest: A 5 mm calcified lung nodule is seen within the posterior aspect of the right lung base. Hepatobiliary: There is diffuse  fatty infiltration of the liver parenchyma. No focal liver abnormality is seen. No gallstones, gallbladder wall thickening, or biliary dilatation. Pancreas: The head of the pancreas is enlarged, with areas of fluid attenuation seen within the adjacent pancreaticoduodenal groove. This is stable in size and appearance when compared to the prior study. A mild amount of associated peripancreatic inflammatory fat stranding is noted. No pancreatic ductal dilatation is seen. Spleen: Normal in size without focal abnormality. Adrenals/Urinary Tract: Adrenal glands are unremarkable. Kidneys are normal, without obstructing renal calculi, focal lesion, or hydronephrosis. A 4 mm nonobstructing renal calculus is seen within the lower pole of the right kidney. The urinary bladder is poorly distended and subsequently limited in evaluation. Stomach/Bowel: Stomach is within normal limits. Appendix appears normal. No evidence of bowel dilatation. The thickened decompressed transverse colon and descending colon is seen. A small diverticula are seen within the proximal sigmoid colon. Vascular/Lymphatic: Aortic atherosclerosis. Subcentimeter para-aortic and aortocaval lymph nodes are seen. Reproductive: Uterus and bilateral adnexa are unremarkable. Other: No abdominal wall hernia or abnormality. No abdominopelvic ascites. Musculoskeletal: Marked severity degenerative changes seen at the level of L5-S1. IMPRESSION: 1. Pancreatic head enlargement with additional findings consistent with mild acute pancreatitis. Correlation with pancreatic enzymes and MRI follow-up is recommended, as an underlying neoplastic process cannot be excluded. 2. Hepatic steatosis. 3. 4 mm nonobstructing right renal calculus. 4. Sigmoid diverticulosis. 5. Marked severity degenerative changes at the level of L5-S1. 6. Aortic atherosclerosis. Aortic Atherosclerosis (ICD10-I70.0). Electronically Signed   By: Virgina Norfolk M.D.   On: 12/18/2022 23:36  [2  weeks]  Assessment:   Kaitlin Carpenter is a 52 y.o. year old female  with history of tobacco use and alcohol abuse, and previous pancreatitis who presented to the ED with c/o abdominal pain, nausea, vomiting x4 days, reportedly drinking wine multiple days a week, workup concerning for pancreatitis. GI consulted for further management.   Abdominal pain/nausea/vomiting: lipase elevated at 158, 204 today, CT A/P with pancreatic head enlargement with areas of fluid attenuation seen within the adjacent pancreaticoduodenal groove-stable in size and appearance when compared to the prior study. mild amount of associated peripancreatic inflammatory fat stranding is noted. No pancreatic ductal dilatation is seen. RUQ [place] this morning was unremarkable. T bili is normal and mild elevation of aminotransferases which are trending down.    She has had recurrent episodes of pancreatitis, She does smoke and notably drinks alcohol 4-5 days a week, potentially influencing this. Triglycerides normal. Given recurrent pancreatitis will check IgG4 to rule out autoimmune component, at this time, Differentials include pancreatitis secondary to malignancy, autoimmune pancreatitis or acute pancreatitis secondary to ETOH/hereditary component.   Elevated LFTs: dating back on multiple checks since 07/2018 in pattern consistent with etoh hepatitis. Trending down, nearly normal at this time.     Plan:   Patient controlling pain with oxycodone, she prefers to avoid IV. Continue clear liquids for now.  B12/folate Follow up IgG4.  Work up of elevated LFTs as outpatient if persistently elevated.  Refrain from all etoh use.    LOS: 1 day   Laureen Ochs. Bernarda Caffey [facility] [phone] 3/21/20248:35 AM

## 2022-12-20 NOTE — TOC Progression Note (Addendum)
  Transition of Care [facility]) Screening Note   Patient Details  Name: Kaitlin Carpenter Date of Birth: [birth year]   [facility]) CM/SW Contact:    Boneta Lucks, RN Phone Number: 12/20/2022, 11:30 AM  PCP list added to AVS. Discharge planning for home tomorrow.  Transition of Care Department [facility]) has reviewed patient and no TOC needs have been identified at this time. We will continue to monitor patient advancement through interdisciplinary progression rounds. If new patient transition needs arise, please place a TOC consult.    Expected Discharge Plan: Home/Self Care Barriers to Discharge: Continued Medical Work up  Expected Discharge Plan and Services      Living arrangements for the past 2 months: Single Family Home                  Social Determinants of Health (SDOH) Interventions SDOH Screenings   Food Insecurity: No Food Insecurity (12/19/2022)  Housing: Low Risk  (12/19/2022)  Transportation Needs: No Transportation Needs (12/19/2022)  Utilities: Not At Risk (12/19/2022)  Depression (PHQ2-9): Medium Risk (10/04/2020)  Tobacco Use: High Risk (12/18/2022)    Readmission Risk Interventions     No data to display

## 2022-12-21 ENCOUNTER — Telehealth: Payer: Self-pay | Admitting: Gastroenterology

## 2022-12-21 DIAGNOSIS — Z720 Tobacco use: Secondary | ICD-10-CM | POA: Diagnosis not present

## 2022-12-21 DIAGNOSIS — E871 Hypo-osmolality and hyponatremia: Secondary | ICD-10-CM | POA: Diagnosis not present

## 2022-12-21 DIAGNOSIS — K8500 Idiopathic acute pancreatitis without necrosis or infection: Secondary | ICD-10-CM | POA: Diagnosis not present

## 2022-12-21 LAB — COMPREHENSIVE METABOLIC PANEL
ALT: 36 U/L (ref 0–44)
AST: 44 U/L — ABNORMAL HIGH (ref 15–41)
Albumin: 3.9 g/dL (ref 3.5–5.0)
Alkaline Phosphatase: 47 U/L (ref 38–126)
Anion gap: 9 (ref 5–15)
BUN: 8 mg/dL (ref 6–20)
CO2: 24 mmol/L (ref 22–32)
Calcium: 8.9 mg/dL (ref 8.9–10.3)
Chloride: 100 mmol/L (ref 98–111)
Creatinine, Ser: 0.53 mg/dL (ref 0.44–1.00)
GFR, Estimated: >60 mL/min (ref >60)
Glucose, Bld: 84 mg/dL (ref 70–99)
Potassium: 4.5 mmol/L (ref 3.5–5.1)
Sodium: 133 mmol/L — ABNORMAL LOW (ref 135–145)
Total Bilirubin: 1 mg/dL (ref 0.3–1.2)
Total Protein: 7.6 g/dL (ref 6.5–8.1)

## 2022-12-21 LAB — CBC
HCT: 42.6 % (ref 36.0–46.0)
Hemoglobin: 15 g/dL (ref 12.0–15.0)
MCH: 36.3 pg — ABNORMAL HIGH (ref 26.0–34.0)
MCHC: 35.2 g/dL (ref 30.0–36.0)
MCV: 103.1 fL — ABNORMAL HIGH (ref 80.0–100.0)
Platelets: 129 10*3/uL — ABNORMAL LOW (ref 150–400)
RBC: 4.13 MIL/uL (ref 3.87–5.11)
RDW: 11.4 % — ABNORMAL LOW (ref 11.5–15.5)
WBC: 5.9 10*3/uL (ref 4.0–10.5)
nRBC: 0 % (ref 0.0–0.2)

## 2022-12-21 LAB — IGG 4: IgG, Subclass 4: 41 mg/dL (ref 2–96)

## 2022-12-21 MED ORDER — AMLODIPINE BESYLATE 5 MG PO TABS: 5.0000 mg | ORAL_TABLET | Freq: Every day | ORAL | 1 refills | Status: DC

## 2022-12-21 MED ORDER — OXYCODONE HCL 5 MG PO TABS: 5.0000 mg | ORAL_TABLET | Freq: Four times a day (QID) | ORAL | 0 refills | Status: DC | PRN

## 2022-12-21 NOTE — Telephone Encounter (Signed)
Please make patient a hospital follow up for pancreatitis in 3-4 weeks with Ezra Sites, or any other APP.   Venetia Night, MSN, APRN, FNP-BC, AGACNP-BC [facility]

## 2022-12-21 NOTE — Progress Notes (Signed)
Patient tolerated breakfast well ate about 75% of meal, PRN for pain 7/10 given this am with effectiveness noted by pain score decreased 3/10.

## 2022-12-21 NOTE — Discharge Summary (Signed)
Physician Discharge Summary   Patient: Kaitlin Carpenter MRN: [id number] DOB: October 04, 1970  Admit date:     12/18/2022  Discharge date: 12/21/22  Discharge Physician: Shanon Brow Neleh Muldoon   PCP: Patient, [name]   Recommendations at discharge:   Please follow up with primary care provider within 1-2 weeks  Please repeat BMP and CBC in one week  Hospital Course: 52 year old female with a history of tobacco abuse, alcohol dependence, pancreatitis presenting with 4-day history of upper abdominal pain with nausea and vomiting.  The patient states that she drinks wine about 4 to 5 days/week, but is unable to quantify how much during each sitting.  She states that she is unable to keep anything down secondary to her nausea and vomiting.  She had worsening abdominal pain.  She denies any fevers, chills, chest pain, shortness breath, hemoptysis.  There is no hematemesis.  She denies diarrhea or hematochezia but states that she has been having occasional melena for the past week.  She denies any illicit drug use. In the ED, the patient had low-grade temperature 99.5 F.  She was hemodynamically stable with oxygen saturation 98% on room air.  WBC 10.0, hemoglobin 13.2, platelets 179,000.  Sodium 132, potassium 3.1, bicarbonate 27, serum creatinine 0.65.  AST 57, ALT 39, alk phosphatase 41, total bilirubin 1.1, lipase 204.  CT of the abdomen pelvis showed pancreatic head enlargement with areas of fluid attenuation.  These are stable when compared to prior scans.  There is a small amount of peripancreatic stranding.  There was a nonobstructive right renal calculus.  There is hepatic steatosis and sigmoid diverticulosis.  The patient was started on IV fluids and IV opioids.  GI was consulted to assist with management.  Assessment and Plan: Acute pancreatitis/pancreatic head enlargement -GI consult appreciated>>MRI pancreas in 10-12 weeks -cannot rule out autoimmune pancreatitis--IgG4 still pending at time of dc -IV  fluids>>continued during hospitalization -Judicious opioids -check lipids--triglycerides 53 -RUQ ultrasound--neg -npo for now with sips>>clears>>full liquids>>soft diet which pt is tolerating -UDS positive opiates   Pyuria -UA 11-20 WBC -repeat UA without pyuria   Tobacco abuse -Tobacco cessation discussed -nicoderm patch   Hypokalemia -Repleted -Check mag 2.0   Hyponatremia -Secondary to volume depletion and poor solute intake -Continue IV fluids>>improving       Pain control - [place] Controlled Substance Reporting System database was reviewed. and patient was instructed, not to drive, operate heavy machinery, perform activities at heights, swimming or participation in water activities or provide baby-sitting services while on Pain, Sleep and Anxiety Medications; until their outpatient Physician has advised to do so again. Also recommended to not to take more than prescribed Pain, Sleep and Anxiety Medications.  Consultants: GI Procedures performed: none  Disposition: Home Diet recommendation:  Cardiac diet DISCHARGE MEDICATION: Allergies as of 12/21/2022   No Known Allergies      Medication List     STOP taking these medications    HYDROcodone-acetaminophen 5-325 MG tablet Commonly known as: NORCO/VICODIN   metoCLOPramide 10 MG tablet Commonly known as: REGLAN   ondansetron 4 MG disintegrating tablet Commonly known as: ZOFRAN-ODT   ondansetron 4 MG tablet Commonly known as: ZOFRAN   oxyCODONE-acetaminophen 5-325 MG tablet Commonly known as: PERCOCET/ROXICET       TAKE these medications    amLODipine 5 MG tablet Commonly known as: NORVASC Take 1 tablet (5 mg total) by mouth daily. Start taking on: December 22, 2022   oxyCODONE 5 MG immediate release tablet Commonly known as:  Oxy IR/ROXICODONE Take 1 tablet (5 mg total) by mouth every 6 (six) hours as needed for moderate pain.        Discharge Exam: Filed Weights   12/18/22 1735  12/19/22 0405  Weight: 59 kg 53.8 kg   HEENT:  [place]/AT, No thrush, no icterus CV:  RRR, no rub, no S3, no S4 Lung:  CTA, no wheeze, no rhonchi Abd:  soft/+BS, mild epigastric pain Ext:  No edema, no lymphangitis, no synovitis, no rash   Condition at discharge: stable  The results of significant diagnostics from this hospitalization (including imaging, microbiology, ancillary and laboratory) are listed below for reference.   Imaging Studies: US Abdomen Limited RUQ (LIVER/GB)  Result Date: 12/19/2022 CLINICAL DATA:  6216 Acute pancreatitis 6216 EXAM: ULTRASOUND ABDOMEN LIMITED RIGHT UPPER QUADRANT COMPARISON:  CT abdomen pelvis 12/18/2022, CT abdomen pelvis 07/19/2018 FINDINGS: Gallbladder: No definite gallbladder sludge with suggestion of luminal artifact. No gallstones or wall thickening visualized. No pericholecystic fluid. No sonographic Murphy sign noted by sonographer. Common bile duct: Diameter: 4 mm. Liver: No focal lesion identified. Within normal limits in parenchymal echogenicity. Portal vein is patent on color Doppler imaging with normal direction of blood flow towards the liver. Other: None. IMPRESSION: Unremarkable right upper quadrant ultrasound. Electronically Signed   By: Iven Finn M.D.   On: 12/19/2022 09:33   CT ABDOMEN PELVIS W CONTRAST  Result Date: 12/18/2022 CLINICAL DATA:  Nausea, vomiting and diarrhea. EXAM: CT ABDOMEN AND PELVIS WITH CONTRAST TECHNIQUE: Multidetector CT imaging of the abdomen and pelvis was performed using the standard protocol following bolus administration of intravenous contrast. RADIATION DOSE REDUCTION: This exam was performed according to the departmental dose-optimization program which includes automated exposure control, adjustment of the mA and/or kV according to patient size and/or use of iterative reconstruction technique. CONTRAST:  13mL OMNIPAQUE IOHEXOL 300 MG/ML  SOLN COMPARISON:  July 26, 2022 FINDINGS: Lower chest: A 5 mm  calcified lung nodule is seen within the posterior aspect of the right lung base. Hepatobiliary: There is diffuse fatty infiltration of the liver parenchyma. No focal liver abnormality is seen. No gallstones, gallbladder wall thickening, or biliary dilatation. Pancreas: The head of the pancreas is enlarged, with areas of fluid attenuation seen within the adjacent pancreaticoduodenal groove. This is stable in size and appearance when compared to the prior study. A mild amount of associated peripancreatic inflammatory fat stranding is noted. No pancreatic ductal dilatation is seen. Spleen: Normal in size without focal abnormality. Adrenals/Urinary Tract: Adrenal glands are unremarkable. Kidneys are normal, without obstructing renal calculi, focal lesion, or hydronephrosis. A 4 mm nonobstructing renal calculus is seen within the lower pole of the right kidney. The urinary bladder is poorly distended and subsequently limited in evaluation. Stomach/Bowel: Stomach is within normal limits. Appendix appears normal. No evidence of bowel dilatation. The thickened decompressed transverse colon and descending colon is seen. A small diverticula are seen within the proximal sigmoid colon. Vascular/Lymphatic: Aortic atherosclerosis. Subcentimeter para-aortic and aortocaval lymph nodes are seen. Reproductive: Uterus and bilateral adnexa are unremarkable. Other: No abdominal wall hernia or abnormality. No abdominopelvic ascites. Musculoskeletal: Marked severity degenerative changes seen at the level of L5-S1. IMPRESSION: 1. Pancreatic head enlargement with additional findings consistent with mild acute pancreatitis. Correlation with pancreatic enzymes and MRI follow-up is recommended, as an underlying neoplastic process cannot be excluded. 2. Hepatic steatosis. 3. 4 mm nonobstructing right renal calculus. 4. Sigmoid diverticulosis. 5. Marked severity degenerative changes at the level of L5-S1. 6. Aortic atherosclerosis. Aortic  Atherosclerosis (ICD10-I70.0). Electronically Signed   By: Virgina Norfolk M.D.   On: 12/18/2022 23:36    Microbiology: Results for orders placed or performed during the hospital encounter of 07/26/22  Resp Panel by RT-PCR (Flu A&B, Covid) Anterior Nasal Swab     Status: None   Collection Time: 07/26/22 12:14 PM   Specimen: Anterior Nasal Swab  Result Value Ref Range Status   SARS Coronavirus 2 by RT PCR NEGATIVE NEGATIVE Final    Comment: (NOTE) SARS-CoV-2 target nucleic acids are NOT DETECTED.  The SARS-CoV-2 RNA is generally detectable in upper respiratory specimens during the acute phase of infection. The lowest concentration of SARS-CoV-2 viral copies this assay can detect is 138 copies/mL. A negative result does not preclude SARS-Cov-2 infection and should not be used as the sole basis for treatment or other patient management decisions. A negative result may occur with  improper specimen collection/handling, submission of specimen other than nasopharyngeal swab, presence of viral mutation(s) within the areas targeted by this assay, and inadequate number of viral copies(<138 copies/mL). A negative result must be combined with clinical observations, patient history, and epidemiological information. The expected result is Negative.  Fact Sheet for Patients:  EntrepreneurPulse.com.au  Fact Sheet for Healthcare Providers:  IncredibleEmployment.be  This test is no t yet approved or cleared by the [place] FDA and  has been authorized for detection and/or diagnosis of SARS-CoV-2 by FDA under an Emergency Use Authorization (EUA). This EUA will remain  in effect (meaning this test can be used) for the duration of the COVID-19 declaration under Section 564(b)(1) of the Act, 21 U.S.C.section 360bbb-3(b)(1), unless the authorization is terminated  or revoked sooner.       Influenza A by PCR NEGATIVE NEGATIVE Final   Influenza B by PCR  NEGATIVE NEGATIVE Final    Comment: (NOTE) The Xpert Xpress SARS-CoV-2/FLU/RSV plus assay is intended as an aid in the diagnosis of influenza from Nasopharyngeal swab specimens and should not be used as a sole basis for treatment. Nasal washings and aspirates are unacceptable for Xpert Xpress SARS-CoV-2/FLU/RSV testing.  Fact Sheet for Patients: EntrepreneurPulse.com.au  Fact Sheet for Healthcare Providers: IncredibleEmployment.be  This test is not yet approved or cleared by the [place] FDA and has been authorized for detection and/or diagnosis of SARS-CoV-2 by FDA under an Emergency Use Authorization (EUA). This EUA will remain in effect (meaning this test can be used) for the duration of the COVID-19 declaration under Section 564(b)(1) of the Act, 21 U.S.C. section 360bbb-3(b)(1), unless the authorization is terminated or revoked.  Performed at [facility], [address], [place], [place] [address]     Labs: CBC: Recent Labs  Lab 12/18/22 1940 12/19/22 0443 12/21/22 0426  WBC 8.2 10.0 5.9  NEUTROABS  --  6.0  --   HGB 17.1* 13.2 15.0  HCT 48.6* 37.4 42.6  MCV 103.2* 102.7* 103.1*  PLT 252 179 [id number]*   Basic Metabolic Panel: Recent Labs  Lab 12/18/22 1747 12/19/22 0443 12/20/22 0432 12/21/22 0426  NA 134* 132* 135 133*  K 3.7 3.1* 3.3* 4.5  CL 93* 96* 101 100  CO2 25 27 24 24   GLUCOSE 119* 88 74 84  BUN 28* 23* 10 8  CREATININE 0.77 0.65 0.56 0.53  CALCIUM 9.3 8.2* 8.1* 8.9  MG  --  1.7 2.0  --    Liver Function Tests: Recent Labs  Lab 12/18/22 1747 12/19/22 0443 12/20/22 0432 12/21/22 0426  AST 82* 57* 46* 44*  ALT  62* 39 35 36  ALKPHOS 62 41 42 47  BILITOT 1.2 1.1 1.1 1.0  PROT 9.2* 6.5 6.9 7.6  ALBUMIN 4.7 3.3* 3.5 3.9   CBG: No results for input(s): "GLUCAP" in the last 168 hours.  Discharge time spent: greater than 30 minutes.  Signed: Orson Eva, MD [facility] 12/21/2022

## 2022-12-24 ENCOUNTER — Telehealth: Payer: Self-pay | Admitting: *Deleted

## 2023-01-01 ENCOUNTER — Other Ambulatory Visit (HOSPITAL_COMMUNITY)
Admission: EM | Admit: 2023-01-01 | Discharge: 2023-01-09 | Disposition: A | Discharge status: Home / Self Care | Payer: Medicaid Other | Attending: Psychiatry | Admitting: Psychiatry

## 2023-01-01 ENCOUNTER — Encounter (HOSPITAL_COMMUNITY): Payer: Self-pay | Admitting: Emergency Medicine

## 2023-01-01 DIAGNOSIS — Z560 Unemployment, unspecified: Secondary | ICD-10-CM | POA: Insufficient documentation

## 2023-01-01 DIAGNOSIS — F32A Depression, unspecified: Secondary | ICD-10-CM | POA: Insufficient documentation

## 2023-01-01 DIAGNOSIS — F1010 Alcohol abuse, uncomplicated: Secondary | ICD-10-CM

## 2023-01-01 DIAGNOSIS — F10239 Alcohol dependence with withdrawal, unspecified: Secondary | ICD-10-CM

## 2023-01-01 DIAGNOSIS — F419 Anxiety disorder, unspecified: Secondary | ICD-10-CM | POA: Insufficient documentation

## 2023-01-01 DIAGNOSIS — Z8719 Personal history of other diseases of the digestive system: Secondary | ICD-10-CM | POA: Insufficient documentation

## 2023-01-01 DIAGNOSIS — R1010 Upper abdominal pain, unspecified: Secondary | ICD-10-CM | POA: Insufficient documentation

## 2023-01-01 DIAGNOSIS — Z1152 Encounter for screening for COVID-19: Secondary | ICD-10-CM | POA: Diagnosis not present

## 2023-01-01 DIAGNOSIS — F17210 Nicotine dependence, cigarettes, uncomplicated: Secondary | ICD-10-CM | POA: Insufficient documentation

## 2023-01-01 LAB — SARS CORONAVIRUS 2 BY RT PCR: SARS Coronavirus 2 by RT PCR: NEGATIVE

## 2023-01-01 LAB — URINALYSIS, ROUTINE W REFLEX MICROSCOPIC
Bilirubin Urine: NEGATIVE
Glucose, UA: NEGATIVE mg/dL
Hgb urine dipstick: NEGATIVE
Ketones, ur: NEGATIVE mg/dL
Leukocytes,Ua: NEGATIVE
Nitrite: NEGATIVE
Protein, ur: NEGATIVE mg/dL
Specific Gravity, Urine: 1.005 (ref 1.005–1.030)
pH: 7 (ref 5.0–8.0)

## 2023-01-01 LAB — CBC WITH DIFFERENTIAL/PLATELET
Abs Immature Granulocytes: 0 10*3/uL (ref 0.00–0.07)
Basophils Absolute: 0.1 10*3/uL (ref 0.0–0.1)
Basophils Relative: 1 %
Eosinophils Absolute: 0.7 10*3/uL — ABNORMAL HIGH (ref 0.0–0.5)
Eosinophils Relative: 12 %
HCT: 42.9 % (ref 36.0–46.0)
Hemoglobin: 14.2 g/dL (ref 12.0–15.0)
Immature Granulocytes: 0 %
Lymphocytes Relative: 42 %
Lymphs Abs: 2.6 10*3/uL (ref 0.7–4.0)
MCH: 35.2 pg — ABNORMAL HIGH (ref 26.0–34.0)
MCHC: 33.1 g/dL (ref 30.0–36.0)
MCV: 106.5 fL — ABNORMAL HIGH (ref 80.0–100.0)
Monocytes Absolute: 0.6 10*3/uL (ref 0.1–1.0)
Monocytes Relative: 10 %
Neutro Abs: 2.2 10*3/uL (ref 1.7–7.7)
Neutrophils Relative %: 35 %
Platelets: 261 10*3/uL (ref 150–400)
RBC: 4.03 MIL/uL (ref 3.87–5.11)
RDW: 11.9 % (ref 11.5–15.5)
WBC: 6.1 10*3/uL (ref 4.0–10.5)
nRBC: 0 % (ref 0.0–0.2)

## 2023-01-01 LAB — COMPREHENSIVE METABOLIC PANEL
ALT: 61 U/L — ABNORMAL HIGH (ref 0–44)
AST: 70 U/L — ABNORMAL HIGH (ref 15–41)
Albumin: 4 g/dL (ref 3.5–5.0)
Alkaline Phosphatase: 52 U/L (ref 38–126)
Anion gap: 6 (ref 5–15)
BUN: 7 mg/dL (ref 6–20)
CO2: 30 mmol/L (ref 22–32)
Calcium: 9.6 mg/dL (ref 8.9–10.3)
Chloride: 99 mmol/L (ref 98–111)
Creatinine, Ser: 0.82 mg/dL (ref 0.44–1.00)
GFR, Estimated: >60 mL/min (ref >60)
Glucose, Bld: 90 mg/dL (ref 70–99)
Potassium: 4.7 mmol/L (ref 3.5–5.1)
Sodium: 135 mmol/L (ref 135–145)
Total Bilirubin: 0.3 mg/dL (ref 0.3–1.2)
Total Protein: 7.5 g/dL (ref 6.5–8.1)

## 2023-01-01 LAB — POCT URINE DRUG SCREEN - MANUAL ENTRY (I-SCREEN)
POC Amphetamine UR: NOT DETECTED
POC Buprenorphine (BUP): POSITIVE — AB
POC Cocaine UR: NOT DETECTED
POC Marijuana UR: POSITIVE — AB
POC Methadone UR: NOT DETECTED
POC Methamphetamine UR: NOT DETECTED
POC Morphine: NOT DETECTED
POC Oxazepam (BZO): NOT DETECTED
POC Oxycodone UR: NOT DETECTED
POC Secobarbital (BAR): NOT DETECTED

## 2023-01-01 LAB — MAGNESIUM: Magnesium: 2 mg/dL (ref 1.7–2.4)

## 2023-01-01 LAB — TSH: TSH: 2.26 u[IU]/mL (ref 0.350–4.500)

## 2023-01-01 LAB — POC SARS CORONAVIRUS 2 AG: SARSCOV2ONAVIRUS 2 AG: NEGATIVE

## 2023-01-01 LAB — ETHANOL: Alcohol, Ethyl (B): <10 mg/dL (ref <10)

## 2023-01-01 MED ORDER — ALUM & MAG HYDROXIDE-SIMETH 200-200-20 MG/5ML PO SUSP: 30.0000 mL | ORAL | Status: DC | PRN

## 2023-01-01 MED ORDER — THIAMINE HCL 100 MG/ML IJ SOLN
100.0000 mg | Freq: Once | INTRAMUSCULAR | Status: AC
  Administered 2023-01-01: 100 mg via INTRAMUSCULAR
  Filled 2023-01-01: qty 2

## 2023-01-01 MED ORDER — HYDROXYZINE HCL 25 MG PO TABS
25.0000 mg | ORAL_TABLET | Freq: Four times a day (QID) | ORAL | Status: AC | PRN
  Administered 2023-01-02 – 2023-01-03 (×2): 25 mg via ORAL
  Filled 2023-01-01 (×2): qty 1

## 2023-01-01 MED ORDER — TRAZODONE HCL 100 MG PO TABS
100.0000 mg | ORAL_TABLET | Freq: Every evening | ORAL | Status: DC | PRN
  Administered 2023-01-01 – 2023-01-08 (×8): 100 mg via ORAL
  Filled 2023-01-01 (×6): qty 1
  Filled 2023-01-01: qty 14
  Filled 2023-01-01 (×2): qty 1

## 2023-01-01 MED ORDER — MAGNESIUM HYDROXIDE 400 MG/5ML PO SUSP
30.0000 mL | Freq: Every day | ORAL | Status: DC | PRN
  Administered 2023-01-04: 30 mL via ORAL
  Filled 2023-01-01: qty 30

## 2023-01-01 MED ORDER — LOPERAMIDE HCL 2 MG PO CAPS: 2.0000 mg | ORAL_CAPSULE | ORAL | Status: AC | PRN

## 2023-01-01 MED ORDER — ONDANSETRON 4 MG PO TBDP: 4.0000 mg | ORAL_TABLET | Freq: Four times a day (QID) | ORAL | Status: AC | PRN

## 2023-01-01 MED ORDER — THIAMINE MONONITRATE 100 MG PO TABS
100.0000 mg | ORAL_TABLET | Freq: Every day | ORAL | Status: DC
  Administered 2023-01-02 – 2023-01-08 (×7): 100 mg via ORAL
  Filled 2023-01-01 (×7): qty 1

## 2023-01-01 MED ORDER — ACETAMINOPHEN 325 MG PO TABS
650.0000 mg | ORAL_TABLET | Freq: Four times a day (QID) | ORAL | Status: DC | PRN
  Administered 2023-01-02 – 2023-01-08 (×11): 650 mg via ORAL
  Filled 2023-01-01 (×11): qty 2

## 2023-01-01 MED ORDER — ADULT MULTIVITAMIN W/MINERALS CH
1.0000 | ORAL_TABLET | Freq: Every day | ORAL | Status: DC
  Administered 2023-01-01 – 2023-01-08 (×8): 1 via ORAL
  Filled 2023-01-01 (×8): qty 1

## 2023-01-01 MED ORDER — LORAZEPAM 1 MG PO TABS
1.0000 mg | ORAL_TABLET | Freq: Four times a day (QID) | ORAL | Status: AC | PRN
  Administered 2023-01-01 – 2023-01-02 (×2): 1 mg via ORAL
  Filled 2023-01-01 (×2): qty 1

## 2023-01-01 MED ORDER — NICOTINE 14 MG/24HR TD PT24
14.0000 mg | MEDICATED_PATCH | Freq: Every day | TRANSDERMAL | Status: DC
  Administered 2023-01-01 – 2023-01-05 (×5): 14 mg via TRANSDERMAL
  Filled 2023-01-01 (×5): qty 1

## 2023-01-01 NOTE — ED Notes (Signed)
Pt sleeping at present, no distress noted.  Monitoring for safety. 

## 2023-01-01 NOTE — ED Notes (Signed)
Pt sleeping@this time. Breathing even and unlabored. Will continue to monitor for safety 

## 2023-01-01 NOTE — ED Notes (Signed)
Patient A&Ox4. Patient presented voluntarily and unaccompanied asking for detox from alcohol. Patient denies SI/HI and AVH. Patient denies any physical complaints when asked. No acute distress noted. Support and encouragement provided. Routine safety checks conducted according to facility protocol. Encouraged patient to notify staff if thoughts of harm toward self or others arise. Patient verbalize understanding and agreement. Will continue to monitor for safety.         t

## 2023-01-01 NOTE — Progress Notes (Signed)
   01/01/23 1416  [place] (Walk-ins at [facility] only)  How Did You Hear About [place]? Self  What Is the Reason for Your Visit/Call Today? Pt is a 52 yo female who presented voluntarily and unaccompanied asking for detoc from alcohol. Pt stated she has not had any alcohol in about 3 days. Pt stated she went to be admitted at [facility] and they sent her to [facility] for detox. Pt denied SI, HI, MSSH, AVH and paranoia. Pt reported daily use of alcohol  and cannabis until about 3 to 4 days ago. Pt stated she was in the hospital recently (discharged 12/23/22) due to pancreatitis. Pt denied any past suicide attempts or any IP psyvchiatric admissions. Pt currently has not OP psychiatric providers and no PCP.  How Long Has This Been Causing You Problems? > than 6 months  Have You Recently Had Any Thoughts About Hurting Yourself? No  Are You Planning to Commit Suicide/Harm Yourself At This time? No  Have you Recently Had Thoughts About [place]? No  Are You Planning To Harm Someone At This Time? No  Are you currently experiencing any auditory, visual or other hallucinations? No  Have You Used Any Alcohol or Drugs in the Past 24 Hours? No  Do you have any current medical co-morbidities that require immediate attention? No  Clinician description of patient physical appearance/behavior: Pt was calm, cooperative, alert and seemed fully oriented. Pt 's mood was mildly depressed and her flat affect was congruent. Pt's speech and movement was within normal limits. She was casually and neatly dressed.  What Do You Feel Would Help You the Most Today? Alcohol or Drug Use Treatment  If access to [facility] was not available, would you have sought care in the Emergency Department? No  Determination of Need Urgent (48 hours)  Options For Referral Facility-Based Crisis

## 2023-01-01 NOTE — BH Assessment (Signed)
Comprehensive Clinical Assessment (CCA) Note  01/01/2023 Kaitlin Carpenter JO:[id number]  DISPOSITION: Per Dr. Vergia Alberts, pt is recommended for observation at [facility]  The patient demonstrates the following risk factors for suicide: Chronic risk factors for suicide include: psychiatric disorder of MDD and GAD and substance use disorder. Acute risk factors for suicide include: social withdrawal/isolation. Protective factors for this patient include: positive social support, responsibility to others (children, family), and hope for the future. Considering these factors, the overall suicide risk at this point appears to be low. Patient is appropriate for outpatient follow up.   Pt is a 52 yo female who presented voluntarily and unaccompanied asking for detoc from alcohol. Pt stated she has not had any alcohol in about 3 days. Pt stated she went to be admitted at [facility] and they sent her to [facility] for detox. Pt denied SI, HI, MSSH, AVH and paranoia. Pt reported daily use of alcohol and cannabis until about 3 to 4 days ago. Pt stated she was in the hospital recently (discharged 12/23/22) due to pancreatitis. Pt denied any past suicide attempts or any IP psyvchiatric admissions. Pt currently has not OP psychiatric providers and no PCP.   Pt reported that she lives with her two brothers, one of whom is disabled intellectually and whom she is caregiver. Pt has 1 adult son who lives in [place]. Pt stated that they have a "good" relationship but cannot see each other often due to his location. Pt stated that both of her parents were "alcoholics." Pt stated that her father died when she was 40 yo and her mother died when she was 44 yo. Pt stated "I really just raised myself." Pt reported that she went through rehab treatment at [facility] about 30 years ago and did :fairly well for a while." Pt added that she has a hx of panic attacks for which she uses cannabis to "calm my nerves."   Pt was calm, cooperative, alert and seemed fully  oriented. Pt 's mood was mildly depressed and her flat affect was congruent. Pt's speech and movement was within normal limits. She was casually and neatly dressed.     Chief Complaint:  Chief Complaint  Patient presents with   Addiction Problem   Visit Diagnosis:  Alcohol Use d/o, Severe MDD, Recurrent, Moderate GAD, mild    CCA Screening, Triage and Referral (STR)  Patient Reported Information How did you hear about [place]? Self  What Is the Reason for Your Visit/Call Today? Pt is a 52 yo female who presented voluntarily and unaccompanied asking for detoc from alcohol. Pt stated she has not had any alcohol in about 3 days. Pt stated she went to be admitted at [facility] and they sent her to [facility] for detox. Pt denied SI, HI, MSSH, AVH and paranoia. Pt reported daily use of alcohol  and cannabis until about 3 to 4 days ago. Pt stated she was in the hospital recently (discharged 12/23/22) due to pancreatitis. Pt denied any past suicide attempts or any IP psyvchiatric admissions. Pt currently has not OP psychiatric providers and no PCP.  How Long Has This Been Causing You Problems? > than 6 months  What Do You Feel Would Help You the Most Today? Alcohol or Drug Use Treatment   Have You Recently Had Any Thoughts About Hurting Yourself? No  Are You Planning to Commit Suicide/Harm Yourself At This time? No   Flowsheet Row ED from 01/01/2023 in [facility] ED to Hosp-Admission (Discharged) from 12/18/2022 in  [place] ED from 10/09/2022 in [facility] Emergency Department at [place] No Risk No Risk No Risk       Have you Recently Had Thoughts About [place]? No  Are You Planning to Harm Someone at This Time? No  Explanation: no  Have You Used Any Alcohol or Drugs in the Past 24 Hours? No  What Did You Use and How Much? unknown  Do You Currently Have a Therapist/Psychiatrist? No  Name of  Therapist/Psychiatrist: Name of Therapist/Psychiatrist: na   Have You Been Recently Discharged From Any Office Practice or Programs? Yes  Explanation of Discharge From Practice/Program: Recently discharged from [facility] due to pancreatitis. Per pt dischaged was 12/23/22.     CCA Screening Triage Referral Assessment Type of Contact: Face-to-Face  Telemedicine Service Delivery:   Is this Initial or Reassessment?   Date Telepsych consult ordered in [facility]:    Time Telepsych consult ordered in [facility]:    Location of Assessment: [facility] Assessment Services  Provider Location: [facility]   Collateral Involvement: none   Does Patient Have a [place]? No  Legal Guardian Contact Information: na  Copy of Legal Guardianship Form: No - copy requested  Legal Guardian Notified of Arrival: -- (na)  Legal Guardian Notified of Pending Discharge: -- (na)  If Minor and Not Living with Parent(s), Who has Custody? adult  Is CPS involved or ever been involved? Never (nonr reported)  Is APS involved or ever been involved? Never (none reported)   Patient Determined To Be At Risk for Harm To Self or Others Based on Review of Patient Reported Information or Presenting Complaint? No  Method: No Plan  Availability of Means: No access or NA  Intent: Vague intent or NA  Notification Required: No need or identified person  Additional Information for Danger to Others Potential: -- (denied past attempts)  Additional Comments for Danger to Others Potential: na  Are There Guns or Other Weapons in Your Home? No (denied)  Types of Guns/Weapons: na  Are These Weapons Safely Secured?                            -- (na)  Who Could Verify You Are Able To Have These Secured: na  Do You Have any Outstanding Charges, Pending Court Dates, Parole/Probation? none reported  Contacted To Inform of Risk of Harm To Self or Others: -- (na)    Does Patient  Present under Involuntary Commitment? No    [place] of Residence: [place]   Patient Currently Receiving the Following Services: Not Receiving Services   Determination of Need: Urgent (48 hours) (Per Dr. Vergia Alberts pt is recommended fot observation at [facility])   Options For Referral: [facility] (OBS)     CCA Biopsychosocial Patient Reported Schizophrenia/Schizoaffective Diagnosis in Past: No   Strengths: resilient   Mental Health Symptoms Depression:   Difficulty Concentrating; Fatigue; Hopelessness; Irritability   Duration of Depressive symptoms:  Duration of Depressive Symptoms: Greater than two weeks   Mania:   None   Anxiety:    Worrying   Psychosis:   None   Duration of Psychotic symptoms:    Trauma:   None   Obsessions:   None   Compulsions:   None   Inattention:   N/A   Hyperactivity/Impulsivity:   N/A   Oppositional/Defiant Behaviors:   N/A  Emotional Irregularity:   None   Other Mood/Personality Symptoms:   none    Mental Status Exam Appearance and self-care  Stature:   Small   Weight:   Thin   Clothing:   Casual; Neat/clean   Grooming:   Normal   Cosmetic use:   Age appropriate   Posture/gait:   Normal   Motor activity:   Not Remarkable   Sensorium  Attention:   Normal   Concentration:   Normal   Orientation:   Object; Person; Place; Situation; Time; X5   Recall/memory:   Normal   Affect and Mood  Affect:   Depressed; Flat   Mood:   Depressed   Relating  Eye contact:   Normal   Facial expression:   Sad   Attitude toward examiner:   Cooperative   Thought and Language  Speech flow:  Clear and Coherent   Thought content:   Appropriate to Mood and Circumstances   Preoccupation:   None   Hallucinations:   None   Organization:   Coherent; Intact   [employer] of Knowledge:   Average   Intelligence:   Average   Abstraction:   Functional   Judgement:   Fair    Art therapist:   Adequate   Insight:   Fair   Decision Making:   Impulsive; Vacilates   Social Functioning  Social Maturity:   Impulsive   Social Judgement:   Heedless   Stress  Stressors:   Other (Comment) (alcohol use)   Coping Ability:   Deficient supports   Skill Deficits:   Self-control   Supports:   Family; Friends/Service system     Religion: Religion/Spirituality Are You A Religious Person?: Yes How Might This Affect Treatment?: unknown  Leisure/Recreation: Leisure / Recreation Do You Have Hobbies?: No  Exercise/Diet: Exercise/Diet Do You Exercise?: Yes What Type of Exercise Do You Do?: Run/Walk How Many Times a Week Do You Exercise?: 1-3 times a week Have You Gained or Lost A Significant Amount of Weight in the Past Six Months?: No Do You Follow a Special Diet?: No Do You Have Any Trouble Sleeping?: No   CCA Employment/Education Employment/Work Situation: Employment / Work Situation Employment Situation: 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" Patient's Job has Been Impacted by Current Illness: No Has Patient ever Been in Passenger transport manager?: No  Education: Education Is Patient Currently Attending School?: No Last Grade Completed: 9 Did You Nutritional therapist?: No Did You Have An Individualized Education Program (IIEP): No Did You Have Any Difficulty At [employer]?: No Patient's Education Has Been Impacted by Current Illness: No   CCA Family/Childhood History Family and Relationship History: Family history Marital status: Divorced Divorced, when?: unknown What types of issues is patient dealing with in the relationship?: na Additional relationship information: stated she lives with her 2 brothers Does patient have children?: Yes How many children?: 1 (79 yo son in [place]) How is patient's relationship with their children?: "good"  Childhood History:  Childhood History By whom was/is the patient raised?:  Other (Comment) (Per pt, father died when she was 11 and mother died when she was 38 yo. Pt stated both were "alcoholics.") Did patient suffer any verbal/emotional/physical/sexual abuse as a child?: No Did patient suffer from severe childhood neglect?: No Has patient ever been sexually abused/assaulted/raped as an adolescent or adult?: No Was the patient ever a victim of a crime or a disaster?: No Witnessed domestic violence?: No Has patient been affected  by domestic violence as an adult?: No       CCA Substance Use Alcohol/Drug Use: Alcohol / Drug Use Pain Medications: see MAR Prescriptions: see MAR Over the Counter: see MAR History of alcohol / drug use?: Yes Longest period of sobriety (when/how long): unknonwn Negative Consequences of Use:  (none reported) Withdrawal Symptoms: None Substance #1 Name of Substance 1: alcohol 1 - Age of First Use: 11 1 - Amount (size/oz): usually wine and beer; amount varies 1 - Frequency: daily 1 - Duration: ongoing 1 - Last Use / Amount: 3 days ago 1 - Method of Aquiring: purchase 1- Route of Use: oral Substance #2 Name of Substance 2: cannabis 2 - Age of First Use: 13 2 - Amount (size/oz): varies 2 - Frequency: daily 2 - Duration: ongoing 2 - Last Use / Amount: 4 days ago 2 - Method of Aquiring: unknown 2 - Route of Substance Use: smoke                     ASAM's:  Six Dimensions of Multidimensional Assessment  Dimension 1:  Acute Intoxication and/or Withdrawal Potential:      Dimension 2:  Biomedical Conditions and Complications:      Dimension 3:  Emotional, Behavioral, or Cognitive Conditions and Complications:  Dimension 3:  Description of emotional, behavioral, or cognitive conditions and complications: depression and GAD  Dimension 4:  Readiness to Change:     Dimension 5:  Relapse, Continued use, or Continued Problem Potential:     Dimension 6:  Recovery/Living Environment:     ASAM Severity Score:    ASAM  Recommended Level of Treatment: ASAM Recommended Level of Treatment: Level I Outpatient Treatment   Substance use Disorder (SUD) Substance Use Disorder (SUD)  Checklist Symptoms of Substance Use: Presence of craving or strong urge to use, Continued use despite having a persistent/recurrent physical/psychological problem caused/exacerbated by use, Continued use despite persistent or recurrent social, interpersonal problems, caused or exacerbated by use  Recommendations for Services/Supports/Treatments: Recommendations for Services/Supports/Treatments Recommendations For Services/Supports/Treatments: Individual Therapy  Discharge Disposition:    DSM5 Diagnoses: Patient Active Problem List   Diagnosis Date Noted   Acute pancreatitis [id number]   Acute alcoholic pancreatitis [id number]   Hyponatremia 12/19/2022   Hypokalemia 12/19/2022   Tobacco abuse 12/19/2022   Melena 12/19/2022   Alcohol abuse 10/04/2020     Referrals to Alternative Service(s): Referred to Alternative Service(s):   Place:   Date:   Time:    Referred to Alternative Service(s):   Place:   Date:   Time:    Referred to Alternative Service(s):   Place:   Date:   Time:    Referred to Alternative Service(s):   Place:   Date:   Time:     Angline Schweigert T, Counselor

## 2023-01-01 NOTE — ED Provider Notes (Signed)
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   Potassium 07/26/2022 2.8 (L)  3.5 - 5.1 mmol/L Final   Chloride 07/26/2022 95 (L)  98 - 111 mmol/L Final   CO2 07/26/2022 28  22 - 32 mmol/L Final   Glucose, Bld 07/26/2022 120 (H)  70 - 99 mg/dL Final   Glucose reference range applies only to samples taken after fasting for at least 8 hours.   BUN 07/26/2022 23 (H)  6 - 20 mg/dL Final   Creatinine, Ser 07/26/2022 0.67  0.44 - 1.00 mg/dL Final   Calcium 07/26/2022 9.7  8.9 - 10.3 mg/dL Final   Total Protein 07/26/2022 9.1 (H)  6.5 - 8.1 g/dL Final   Albumin 07/26/2022 4.8  3.5 - 5.0 g/dL Final   AST  07/26/2022 191 (H)  15 - 41 U/L Final   ALT 07/26/2022 132 (H)  0 - 44 U/L Final   Alkaline Phosphatase 07/26/2022 73  38 - 126 U/L Final   Total Bilirubin 07/26/2022 1.2  0.3 - 1.2 mg/dL Final   GFR, Estimated 07/26/2022 >60  >60 mL/min Final   Comment: (NOTE) Calculated using the CKD-EPI Creatinine Equation (2021)    Anion gap 07/26/2022 13  5 - 15 Final   Performed at [facility], [address], [place], [place] [address]   SARS Coronavirus 2 by RT PCR 07/26/2022 NEGATIVE  NEGATIVE Final   Comment: (NOTE) SARS-CoV-2 target nucleic acids are NOT DETECTED.  The SARS-CoV-2 RNA is generally detectable in upper respiratory specimens during the acute phase of infection. The lowest concentration of SARS-CoV-2 viral copies this assay can detect is 138 copies/mL. A negative result does not preclude SARS-Cov-2 infection and should not be used as the sole basis for treatment or other patient management decisions. A negative result may occur with  improper specimen collection/handling, submission of specimen other than nasopharyngeal swab, presence of viral mutation(s) within the areas targeted by this assay, and inadequate number of viral copies(<138 copies/mL). A negative result must be combined with clinical observations, patient history, and epidemiological information. The expected result is Negative.  Fact Sheet for Patients:  EntrepreneurPulse.com.au  Fact  Sheet for Healthcare Providers:  IncredibleEmployment.be  This test is no                          t yet approved or cleared by the [place] FDA and  has been authorized for detection and/or diagnosis of SARS-CoV-2 by FDA under an Emergency Use Authorization (EUA). This EUA will remain  in effect (meaning this test can be used) for the duration of the COVID-19 declaration under Section 564(b)(1) of the Act, 21 U.S.C.section 360bbb-3(b)(1), unless the authorization is terminated  or  revoked sooner.       Influenza A by PCR 07/26/2022 NEGATIVE  NEGATIVE Final   Influenza B by PCR 07/26/2022 NEGATIVE  NEGATIVE Final   Comment: (NOTE) The Xpert Xpress SARS-CoV-2/FLU/RSV plus assay is intended as an aid in the diagnosis of influenza from Nasopharyngeal swab specimens and should not be used as a sole basis for treatment. Nasal washings and aspirates are unacceptable for Xpert Xpress SARS-CoV-2/FLU/RSV testing.  Fact Sheet for Patients: EntrepreneurPulse.com.au  Fact Sheet for Healthcare Providers: IncredibleEmployment.be  This test is not yet approved or cleared by the [place] FDA and has been authorized for detection and/or diagnosis of SARS-CoV-2 by FDA under an Emergency Use Authorization (EUA). This EUA will remain in effect (meaning this test can be used) for the duration of the COVID-19 declaration under Section 564(b)(1) of the Act, 21 U.S.C. section 360bbb-3(b)(1), unless the authorization is terminated or revoked.  Performed at [facility], [address], [place], [place] [address]    Troponin I (High Sensitivity) 07/26/2022 12  <18 ng/L Final   Comment: (NOTE) Elevated high sensitivity troponin I (hsTnI) values and significant  changes across serial measurements may suggest ACS but many other  chronic and acute conditions are known to elevate hsTnI results.  Refer to the "Links" section for chest pain algorithms and additional  guidance. Performed at [facility], [address], [place], [place] [address]    Troponin I (High Sensitivity) 07/26/2022 13  <18 ng/L Final   Comment: (NOTE) Elevated high sensitivity troponin I (hsTnI) values and significant  changes across serial measurements may suggest ACS but many other  chronic and acute conditions are known to elevate hsTnI results.  Refer to the "Links" section for chest pain algorithms and additional  guidance. Performed at [facility], [address], [place], [place] [address]    Lipase 07/26/2022 220 (H)  11 - 51 U/L Final   Performed at [facility], [address], [place], [place] [address]    Allergies: Patient has no known allergies.  Medications:  Facility Ordered Medications  Medication   acetaminophen (TYLENOL) tablet 650 mg   alum & mag hydroxide-simeth (MAALOX/MYLANTA) 200-200-20 MG/5ML suspension 30 mL   magnesium hydroxide (MILK OF MAGNESIA) suspension 30 mL   thiamine (VITAMIN B1) injection 100 mg   [START ON 01/02/2023] thiamine (VITAMIN B1) tablet 100 mg   multivitamin with minerals tablet 1 tablet   LORazepam (ATIVAN) tablet 1 mg   hydrOXYzine (ATARAX) tablet 25 mg   loperamide (IMODIUM) capsule 2-4 mg   ondansetron (ZOFRAN-ODT) disintegrating tablet 4 mg   PTA Medications  Medication Sig   amLODipine (NORVASC) 5 MG tablet Take 1 tablet (5 mg total) by mouth daily.    Medical Decision Making  Patient will be medically stabilized in observation and then plan to go through detox in [facility]. Labs ordered: CBC, CMP, UA, UDS, EKG, TSH, Lipase, Magnesium,  ethanol, A1c -CIWA protocol -Ativan PRN -Thiamine and multivitamin    Recommendations  Based on my evaluation the patient does not appear to have an emergency medical condition.  Alesia Morin, MD 01/01/23  3:46 PM

## 2023-01-02 DIAGNOSIS — Z8719 Personal history of other diseases of the digestive system: Secondary | ICD-10-CM | POA: Diagnosis not present

## 2023-01-02 DIAGNOSIS — F419 Anxiety disorder, unspecified: Secondary | ICD-10-CM | POA: Diagnosis not present

## 2023-01-02 DIAGNOSIS — Z1152 Encounter for screening for COVID-19: Secondary | ICD-10-CM | POA: Diagnosis not present

## 2023-01-02 DIAGNOSIS — F1010 Alcohol abuse, uncomplicated: Secondary | ICD-10-CM | POA: Diagnosis not present

## 2023-01-02 LAB — POCT PREGNANCY, URINE: Preg Test, Ur: NEGATIVE

## 2023-01-02 LAB — HEMOGLOBIN A1C
Hgb A1c MFr Bld: 5.5 % (ref 4.8–5.6)
Mean Plasma Glucose: 111 mg/dL
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
## 2023-01-02 NOTE — ED Notes (Signed)
Pt sleeping in no acute distress. RR even and unlabored. Environment secured. Will continue to monitor for safety. 

## 2023-01-02 NOTE — ED Notes (Signed)
Pt sitting in dayroom interacting with peers. No acute distress noted. No concerns voiced. Informed pt to notify staff with any needs or assistance. Pt verbalized understanding or agreement. Will continue to monitor for safety. 

## 2023-01-02 NOTE — ED Notes (Signed)
Patient resting quietly in bed with eyes closed. Respirations equal and unlabored, skin warm and dry, NAD. Routine safety checks conducted according to facility protocol. Will continue to monitor for safety.  

## 2023-01-02 NOTE — ED Notes (Signed)
Kim at [facility] lab contacted to add lipase to blood drawn on yesterday. Lab tech reported there was enough blood to run lab.

## 2023-01-02 NOTE — ED Notes (Signed)
Patient A&Ox4. Patient denies Si/Hi and AVH. Patient denies any physical complaints when asked. No acute distress noted. Support and encouragement provided. Routine safety checks conducted according to facility protocol. Encouraged patient to notify staff if thoughts of harm toward self or others arise. Patient verbalize understanding and agreement. Will continue to monitor for safety.

## 2023-01-02 NOTE — ED Provider Notes (Signed)
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  11.5 - 15.5 % Final   Platelets 10/09/2022 157  150 - 400 K/uL Final   nRBC 10/09/2022 0.0  0.0 - 0.2 % Final   Performed at [facility], [place] [address], [place], [place] [address]   Color, Urine 10/09/2022 AMBER (A)  YELLOW Final   BIOCHEMICALS MAY BE AFFECTED BY COLOR   APPearance 10/09/2022 CLOUDY (A)  CLEAR Final   Specific Gravity, Urine 10/09/2022 1.020  1.005 -  1.030 Final   pH 10/09/2022 6.0  5.0 - 8.0 Final   Glucose, UA 10/09/2022 NEGATIVE  NEGATIVE mg/dL Final   Hgb urine dipstick 10/09/2022 SMALL (A)  NEGATIVE Final   Bilirubin Urine 10/09/2022 NEGATIVE  NEGATIVE Final   Ketones, ur 10/09/2022 80 (A)  NEGATIVE mg/dL Final   Protein, ur 10/09/2022 >=300 (A)  NEGATIVE mg/dL Final   Nitrite 10/09/2022 NEGATIVE  NEGATIVE Final   Leukocytes,Ua 10/09/2022 SMALL (A)  NEGATIVE Final   RBC / HPF 10/09/2022 6-10  0 - 5 RBC/hpf Final   WBC, UA 10/09/2022 21-50  0 - 5 WBC/hpf Final   Bacteria, UA 10/09/2022 MANY (A)  NONE SEEN Final   Squamous Epithelial / HPF 10/09/2022 0-5  0 - 5 /HPF Final   Mucus 10/09/2022 PRESENT   Final   Performed at [facility], [place] [address], [place], [place] [address]   Magnesium 10/09/2022 1.6 (L)  1.7 - 2.4 mg/dL Final   Performed at [place] [address], [place], [place] [address]  Admission on 09/06/2022, Discharged on 09/06/2022  Component Date Value Ref Range Status   Lipase 09/06/2022 37  11 - 51 U/L Final   Performed at [facility], [address], [place], [place] [address]   Sodium 09/06/2022 143  135 - 145 mmol/L Final   Potassium 09/06/2022 3.9  3.5 - 5.1 mmol/L Final   Chloride 09/06/2022 105  98 - 111 mmol/L Final   CO2 09/06/2022 27  22 - 32 mmol/L Final   Glucose, Bld 09/06/2022 125 (H)  70 - 99 mg/dL Final   Glucose reference range  applies only to samples taken after fasting for at least 8 hours.   BUN 09/06/2022 16  6 - 20 mg/dL Final   Creatinine, Ser 09/06/2022 0.72  0.44 - 1.00 mg/dL Final   Calcium 09/06/2022 9.0  8.9 - 10.3 mg/dL Final   Total Protein 09/06/2022 8.3 (H)  6.5 - 8.1 g/dL Final   Albumin 09/06/2022 4.2  3.5 - 5.0 g/dL Final   AST 09/06/2022 150 (H)  15 - 41 U/L Final   ALT 09/06/2022 110 (H)  0 - 44 U/L Final   Alkaline Phosphatase 09/06/2022 58  38 - 126 U/L Final   Total Bilirubin 09/06/2022 0.5  0.3 - 1.2 mg/dL Final   GFR,  Estimated 09/06/2022 >60  >60 mL/min Final   Comment: (NOTE) Calculated using the CKD-EPI Creatinine Equation (2021)    Anion gap 09/06/2022 11  5 - 15 Final   Performed at [facility], [address] [place], [place] [address]   WBC 09/06/2022 7.5  4.0 - 10.5 K/uL Final   RBC 09/06/2022 3.61 (L)  3.87 - 5.11 MIL/uL Final   Hemoglobin 09/06/2022 14.0  12.0 - 15.0 g/dL Final   HCT 09/06/2022 40.2  36.0 - 46.0 % Final   MCV 09/06/2022 111.4 (H)  80.0 - 100.0 fL Final   MCH 09/06/2022 38.8 (H)  26.0 - 34.0 pg Final   MCHC 09/06/2022 34.8  30.0 - 36.0 g/dL Final   RDW 09/06/2022 12.9  11.5 - 15.5 % Final   Platelets 09/06/2022 253  150 - 400 K/uL Final   nRBC 09/06/2022 0.0  0.0 - 0.2 % Final   Performed at [facility], [address], [place], [place] [address]   Color, Urine 09/06/2022 YELLOW  YELLOW Final   APPearance 09/06/2022 HAZY (A)  CLEAR Final   Specific Gravity, Urine 09/06/2022 1.020  1.005 - 1.030 Final   pH 09/06/2022 7.0  5.0 - 8.0 Final   Glucose, UA 09/06/2022 NEGATIVE  NEGATIVE mg/dL Final   Hgb urine dipstick 09/06/2022 NEGATIVE  NEGATIVE Final   Bilirubin Urine 09/06/2022 NEGATIVE  NEGATIVE Final   Ketones, ur 09/06/2022 NEGATIVE  NEGATIVE mg/dL Final   Protein, ur 09/06/2022 >=300 (A)  NEGATIVE mg/dL Final   Nitrite 09/06/2022 NEGATIVE  NEGATIVE Final   Leukocytes,Ua 09/06/2022 SMALL (A)  NEGATIVE Final   WBC, UA 09/06/2022 >50 (H)  0 - 5 WBC/hpf Final   Bacteria, UA 09/06/2022 RARE (A)  NONE SEEN Final   Squamous Epithelial / HPF 09/06/2022 11-20  0 - 5 Final   Mucus 09/06/2022 PRESENT   Final   Performed at [facility], [address] [place], [place] [address]  Admission on 07/26/2022, Discharged on 07/26/2022  Component Date Value Ref Range Status   WBC 07/26/2022 8.6  4.0 - 10.5 K/uL Final   RBC 07/26/2022 3.88  3.87 - 5.11 MIL/uL Final   Hemoglobin 07/26/2022 14.9  12.0 - 15.0 g/dL Final   HCT 07/26/2022 40.9  36.0 - 46.0 % Final   MCV 07/26/2022 105.4  (H)  80.0 - 100.0 fL Final   MCH 07/26/2022 38.4 (H)  26.0 - 34.0 pg Final   MCHC 07/26/2022 36.4 (H)  30.0 - 36.0 g/dL Final   RDW 07/26/2022 12.4  11.5 - 15.5 % Final   Platelets 07/26/2022 165  150 - 400 K/uL Final   nRBC 07/26/2022 0.0  0.0 - 0.2 % Final   Neutrophils Relative % 07/26/2022 83  % Final   Neutro Abs 07/26/2022 7.2  1.7 - 7.7 K/uL Final   Lymphocytes Relative 07/26/2022 11  % Final   Lymphs Abs 07/26/2022 0.9  0.7 - 4.0 K/uL Final   Monocytes Relative 07/26/2022 5  % Final   Monocytes Absolute 07/26/2022 0.4  0.1 - 1.0 K/uL Final   Eosinophils Relative 07/26/2022 0  % Final   Eosinophils Absolute 07/26/2022 0.0  0.0 - 0.5 K/uL Final   Basophils Relative 07/26/2022 1  % Final   Basophils Absolute 07/26/2022 0.0  0.0 - 0.1 K/uL Final   Immature Granulocytes 07/26/2022 0  % Final   Abs Immature Granulocytes 07/26/2022 0.03  0.00 - 0.07 K/uL Final   Performed at [facility], [address], [place], [place] [address]   Sodium 07/26/2022 136  135 - 145 mmol/L Final   Potassium 07/26/2022 2.8 (L)  3.5 - 5.1 mmol/L Final   Chloride 07/26/2022 95 (L)  98 - 111 mmol/L Final   CO2 07/26/2022 28  22 - 32 mmol/L Final   Glucose, Bld 07/26/2022 120 (H)  70 - 99 mg/dL Final   Glucose reference range applies only to samples taken after fasting for at least 8 hours.   BUN 07/26/2022 23 (H)  6 - 20 mg/dL Final   Creatinine, Ser 07/26/2022 0.67  0.44 - 1.00 mg/dL Final   Calcium 07/26/2022 9.7  8.9 - 10.3 mg/dL Final   Total Protein 07/26/2022 9.1 (H)  6.5 - 8.1 g/dL Final   Albumin 07/26/2022 4.8  3.5 - 5.0 g/dL Final   AST 07/26/2022 191 (H)  15 - 41 U/L Final   ALT 07/26/2022 132 (H)  0 - 44 U/L Final   Alkaline Phosphatase 07/26/2022 73  38 - 126 U/L Final   Total Bilirubin 07/26/2022 1.2  0.3 - 1.2 mg/dL Final   GFR, Estimated 07/26/2022 >60  >60 mL/min Final   Comment: (NOTE) Calculated using the CKD-EPI Creatinine Equation (2021)    Anion gap 07/26/2022 13  5 - 15 Final    Performed at [facility], [address] [place], [place] [address]   SARS Coronavirus 2 by RT PCR 07/26/2022 NEGATIVE  NEGATIVE Final   Comment: (NOTE) SARS-CoV-2 target nucleic acids are NOT DETECTED.  The SARS-CoV-2 RNA is generally detectable in upper respiratory specimens during the acute phase of infection. The lowest concentration of SARS-CoV-2 viral copies this assay can detect is 138 copies/mL. A negative result does not preclude SARS-Cov-2 infection and should not be used as the sole basis for treatment or other patient management decisions. A negative result may occur with  improper specimen collection/handling, submission of specimen other than nasopharyngeal swab, presence of viral mutation(s) within the areas targeted by this assay, and inadequate number of viral copies(<138 copies/mL). A negative result must be combined with clinical observations, patient history, and epidemiological information. The expected result is Negative.  Fact Sheet for Patients:  EntrepreneurPulse.com.au  Fact Sheet for Healthcare Providers:  IncredibleEmployment.be  This test is no                          t yet approved or cleared by the [place] FDA and  has been authorized for detection and/or diagnosis of SARS-CoV-2 by FDA under an Emergency Use Authorization (EUA). This EUA will remain  in effect (meaning this test can be used) for the duration of the COVID-19 declaration under Section 564(b)(1) of the Act, 21 U.S.C.section 360bbb-3(b)(1), unless the authorization is terminated  or revoked sooner.  Influenza A by PCR 07/26/2022 NEGATIVE  NEGATIVE Final   Influenza B by PCR 07/26/2022 NEGATIVE  NEGATIVE Final   Comment: (NOTE) The Xpert Xpress SARS-CoV-2/FLU/RSV plus assay is intended as an aid in the diagnosis of influenza from Nasopharyngeal swab specimens and should not be used as a sole basis for treatment. Nasal washings  and aspirates are unacceptable for Xpert Xpress SARS-CoV-2/FLU/RSV testing.  Fact Sheet for Patients: EntrepreneurPulse.com.au  Fact Sheet for Healthcare Providers: IncredibleEmployment.be  This test is not yet approved or cleared by the [place] FDA and has been authorized for detection and/or diagnosis of SARS-CoV-2 by FDA under an Emergency Use Authorization (EUA). This EUA will remain in effect (meaning this test can be used) for the duration of the COVID-19 declaration under Section 564(b)(1) of the Act, 21 U.S.C. section 360bbb-3(b)(1), unless the authorization is terminated or revoked.  Performed at [facility], [address], [place], [place] [address]    Troponin I (High Sensitivity) 07/26/2022 12  <18 ng/L Final   Comment: (NOTE) Elevated high sensitivity troponin I (hsTnI) values and significant  changes across serial measurements may suggest ACS but many other  chronic and acute conditions are known to elevate hsTnI results.  Refer to the "Links" section for chest pain algorithms and additional  guidance. Performed at [facility], [address], [place], [place] [address]    Troponin I (High Sensitivity) 07/26/2022 13  <18 ng/L Final   Comment: (NOTE) Elevated high sensitivity troponin I (hsTnI) values and significant  changes across serial measurements may suggest ACS but many other  chronic and acute conditions are known to elevate hsTnI results.  Refer to the "Links" section for chest pain algorithms and additional  guidance. Performed at [facility], [address] [place], [place] [address]    Lipase 07/26/2022 220 (H)  11 - 51 U/L Final   Performed at [facility], [address], [place], [place] [address]    Allergies: Patient has no known allergies.  Medications:  Facility Ordered Medications  Medication   acetaminophen (TYLENOL) tablet 650 mg   alum & mag hydroxide-simeth (MAALOX/MYLANTA) 200-200-20 MG/5ML  suspension 30 mL   magnesium hydroxide (MILK OF MAGNESIA) suspension 30 mL   [COMPLETED] thiamine (VITAMIN B1) injection 100 mg   thiamine (VITAMIN B1) tablet 100 mg   multivitamin with minerals tablet 1 tablet   LORazepam (ATIVAN) tablet 1 mg   hydrOXYzine (ATARAX) tablet 25 mg   loperamide (IMODIUM) capsule 2-4 mg   ondansetron (ZOFRAN-ODT) disintegrating tablet 4 mg   nicotine (NICODERM CQ - dosed in mg/24 hours) patch 14 mg   traZODone (DESYREL) tablet 100 mg   PTA Medications  Medication Sig   amLODipine (NORVASC) 5 MG tablet Take 1 tablet (5 mg total) by mouth daily.    Long Term Goals: Improvement in symptoms so as ready for discharge  Short Term Goals: Patient will verbalize feelings in meetings with treatment team members., Patient will attend at least of 50% of the groups daily., Pt will complete the PHQ9 on admission, day 3 and discharge., Patient will participate in completing the [place], Patient will score a low risk of violence for 24 hours prior to discharge, and Patient will take medications as prescribed daily.  Medical Decision Making  Patient meets criteria for FBC. Here for detox from alcohol.    Recommendations  Based on my evaluation the patient does not appear to have an emergency medical condition.  Alesia Morin, MD 01/02/23  9:44 AM

## 2023-01-02 NOTE — ED Notes (Addendum)
Pt is in Dayroom watching TV. Respirations are even and unlabored. No acute distress noted. Will continue to monitor for safety.

## 2023-01-02 NOTE — ED Notes (Signed)
Pt is in the Dayroom watching TV. Respirations are even and unlabored. No acute distress noted. Will continue to monitor for safety.  

## 2023-01-02 NOTE — Group Note (Signed)
Group Topic: Fears and Unhealthy Coping Skills  Group Date: 01/02/2023 Start Time: 1100 End Time: 1130 Facilitators: Georgiann Mohs, RN  Department: [facility]  Number of Participants: 8  Group Focus: coping skills Treatment Modality:  Patient-Centered Therapy Interventions utilized were group exercise Purpose: express irrational fears  Name: Kaitlin Carpenter Date of Birth: [birth year]  MR: [id number]    Level of Participation: active Quality of Participation: attentive, cooperative, and motivated Interactions with others: gave feedback Mood/Affect: appropriate and positive Triggers (if applicable):   Cognition: coherent/clear and logical Progress: Gaining insight Response:   Plan: patient will be encouraged to  Continue treatment  Patients Problems:  Patient Active Problem List   Diagnosis Date Noted   Acute pancreatitis [id number]   Acute alcoholic pancreatitis [id number]   Hyponatremia 12/19/2022   Hypokalemia 12/19/2022   Tobacco abuse 12/19/2022   Melena 12/19/2022   Alcohol abuse 10/04/2020

## 2023-01-02 NOTE — Progress Notes (Signed)
Pt transferred to FBC. Report given to Lorra, RN. 

## 2023-01-02 NOTE — ED Notes (Signed)
Pt transferred from obs  requesting detox. Denies SI/HI/AVH. Calm, cooperative throughout interview process. Skin assessment completed. Oriented to unit. Meal and drink offered. At [place], pt continue to denies  SI/HI/AVH. Pt verbally contract for safety. Will monitor for safety.Items were put in locker 26

## 2023-01-02 NOTE — ED Notes (Signed)
Pt sleeping@this time. Breathing even and unlabored. Will continue to monitor for safety 

## 2023-01-03 DIAGNOSIS — F419 Anxiety disorder, unspecified: Secondary | ICD-10-CM | POA: Diagnosis not present

## 2023-01-03 DIAGNOSIS — Z8719 Personal history of other diseases of the digestive system: Secondary | ICD-10-CM | POA: Diagnosis not present

## 2023-01-03 DIAGNOSIS — F1010 Alcohol abuse, uncomplicated: Secondary | ICD-10-CM | POA: Diagnosis not present

## 2023-01-03 DIAGNOSIS — Z1152 Encounter for screening for COVID-19: Secondary | ICD-10-CM | POA: Diagnosis not present

## 2023-01-03 MED ORDER — POLYETHYLENE GLYCOL 3350 17 G PO PACK
17.0000 g | PACK | Freq: Once | ORAL | Status: AC
  Administered 2023-01-03: 17 g via ORAL
  Filled 2023-01-03: qty 1

## 2023-01-03 NOTE — ED Provider Notes (Signed)
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 10/09/2022 NEGATIVE  NEGATIVE Final   Leukocytes,Ua 10/09/2022 SMALL (A)  NEGATIVE Final   RBC / HPF 10/09/2022 6-10  0 - 5 RBC/hpf Final   WBC, UA 10/09/2022 21-50  0 - 5 WBC/hpf Final   Bacteria, UA 10/09/2022 MANY (A)  NONE SEEN Final   Squamous Epithelial / HPF 10/09/2022 0-5  0 - 5 /HPF Final   Mucus 10/09/2022 PRESENT   Final   Performed at [facility], [place] [address], [place], [place] [address]   Magnesium 10/09/2022 1.6 (L)   1.7 - 2.4 mg/dL Final   Performed at [place] [address], [place], [place] [address]  Admission on 09/06/2022, Discharged on 09/06/2022  Component Date Value Ref Range Status   Lipase 09/06/2022 37  11 - 51 U/L Final   Performed at [facility], [address] [place], [place] [address]   Sodium 09/06/2022 143  135 - 145 mmol/L Final   Potassium 09/06/2022 3.9  3.5 - 5.1 mmol/L Final   Chloride 09/06/2022 105  98 - 111 mmol/L Final   CO2 09/06/2022 27  22 - 32 mmol/L Final   Glucose, Bld 09/06/2022 125 (H)  70 - 99 mg/dL Final   Glucose reference range applies only to samples taken after fasting for at least 8 hours.   BUN 09/06/2022 16  6 - 20 mg/dL Final   Creatinine, Ser 09/06/2022 0.72  0.44 - 1.00 mg/dL Final   Calcium 09/06/2022 9.0  8.9 - 10.3 mg/dL Final   Total Protein 09/06/2022 8.3 (H)  6.5 - 8.1 g/dL Final   Albumin 09/06/2022 4.2  3.5 - 5.0 g/dL Final   AST 09/06/2022 150 (H)  15 - 41 U/L Final   ALT 09/06/2022 110 (H)  0 -  44 U/L Final   Alkaline Phosphatase 09/06/2022 58  38 - 126 U/L Final   Total Bilirubin 09/06/2022 0.5  0.3 - 1.2 mg/dL Final   GFR, Estimated 09/06/2022 >60  >60 mL/min Final   Comment: (NOTE) Calculated using the CKD-EPI Creatinine Equation (2021)    Anion gap 09/06/2022 11  5 - 15 Final   Performed at [facility], [address], [place], [place] [address]   WBC 09/06/2022 7.5  4.0 - 10.5 K/uL Final   RBC 09/06/2022 3.61 (L)  3.87 - 5.11 MIL/uL Final   Hemoglobin 09/06/2022 14.0  12.0 - 15.0 g/dL Final   HCT 09/06/2022 40.2  36.0 - 46.0 % Final   MCV 09/06/2022 111.4 (H)  80.0 - 100.0 fL Final   MCH 09/06/2022 38.8 (H)  26.0 - 34.0 pg Final   MCHC 09/06/2022 34.8  30.0 - 36.0 g/dL Final   RDW 09/06/2022 12.9  11.5 - 15.5 % Final   Platelets 09/06/2022 253  150 - 400 K/uL Final   nRBC 09/06/2022 0.0  0.0 - 0.2 % Final   Performed at [facility], [address] [place], [place] [address]   Color, Urine 09/06/2022  YELLOW  YELLOW Final   APPearance 09/06/2022 HAZY (A)  CLEAR Final   Specific Gravity, Urine 09/06/2022 1.020  1.005 - 1.030 Final   pH 09/06/2022 7.0  5.0 - 8.0 Final   Glucose, UA 09/06/2022 NEGATIVE  NEGATIVE mg/dL Final   Hgb urine dipstick 09/06/2022 NEGATIVE  NEGATIVE Final   Bilirubin Urine 09/06/2022 NEGATIVE  NEGATIVE Final   Ketones, ur 09/06/2022 NEGATIVE  NEGATIVE mg/dL Final   Protein, ur 09/06/2022 >=300 (A)  NEGATIVE mg/dL Final   Nitrite 09/06/2022 NEGATIVE  NEGATIVE Final   Leukocytes,Ua 09/06/2022 SMALL (A)  NEGATIVE Final   WBC, UA 09/06/2022 >50 (H)  0 - 5 WBC/hpf Final   Bacteria, UA 09/06/2022 RARE (A)  NONE SEEN Final   Squamous Epithelial / HPF 09/06/2022 11-20  0 - 5 Final   Mucus 09/06/2022 PRESENT   Final   Performed at [facility], [address] [place], [place] [address]  Admission on 07/26/2022, Discharged on 07/26/2022  Component Date Value Ref Range Status   WBC 07/26/2022 8.6  4.0 - 10.5 K/uL Final   RBC 07/26/2022 3.88  3.87 - 5.11 MIL/uL Final   Hemoglobin 07/26/2022 14.9  12.0 - 15.0 g/dL Final   HCT 07/26/2022 40.9  36.0 - 46.0 % Final   MCV 07/26/2022 105.4 (H)  80.0 - 100.0 fL Final   MCH 07/26/2022 38.4 (H)  26.0 - 34.0 pg Final   MCHC 07/26/2022 36.4 (H)  30.0 - 36.0 g/dL Final   RDW 07/26/2022 12.4  11.5 - 15.5 % Final   Platelets 07/26/2022 165  150 - 400 K/uL Final   nRBC 07/26/2022 0.0  0.0 - 0.2 % Final   Neutrophils Relative % 07/26/2022 83  % Final   Neutro Abs 07/26/2022 7.2  1.7 - 7.7 K/uL Final   Lymphocytes Relative 07/26/2022 11  % Final   Lymphs Abs 07/26/2022 0.9  0.7 - 4.0 K/uL Final   Monocytes Relative 07/26/2022 5  % Final   Monocytes Absolute 07/26/2022 0.4  0.1 - 1.0 K/uL Final   Eosinophils Relative 07/26/2022 0  % Final   Eosinophils Absolute 07/26/2022 0.0  0.0 - 0.5 K/uL Final   Basophils Relative 07/26/2022 1  % Final   Basophils Absolute 07/26/2022 0.0  0.0 - 0.1 K/uL Final  Immature Granulocytes 07/26/2022 0   % Final   Abs Immature Granulocytes 07/26/2022 0.03  0.00 - 0.07 K/uL Final   Performed at [facility], [address], [place], [place] [address]   Sodium 07/26/2022 136  135 - 145 mmol/L Final   Potassium 07/26/2022 2.8 (L)  3.5 - 5.1 mmol/L Final   Chloride 07/26/2022 95 (L)  98 - 111 mmol/L Final   CO2 07/26/2022 28  22 - 32 mmol/L Final   Glucose, Bld 07/26/2022 120 (H)  70 - 99 mg/dL Final   Glucose reference range applies only to samples taken after fasting for at least 8 hours.   BUN 07/26/2022 23 (H)  6 - 20 mg/dL Final   Creatinine, Ser 07/26/2022 0.67  0.44 - 1.00 mg/dL Final   Calcium 07/26/2022 9.7  8.9 - 10.3 mg/dL Final   Total Protein 07/26/2022 9.1 (H)  6.5 - 8.1 g/dL Final   Albumin 07/26/2022 4.8  3.5 - 5.0 g/dL Final   AST 07/26/2022 191 (H)  15 - 41 U/L Final   ALT 07/26/2022 132 (H)  0 - 44 U/L Final   Alkaline Phosphatase 07/26/2022 73  38 - 126 U/L Final   Total Bilirubin 07/26/2022 1.2  0.3 - 1.2 mg/dL Final   GFR, Estimated 07/26/2022 >60  >60 mL/min Final   Comment: (NOTE) Calculated using the CKD-EPI Creatinine Equation (2021)    Anion gap 07/26/2022 13  5 - 15 Final   Performed at [facility], [address], [place], [place] [address]   SARS Coronavirus 2 by RT PCR 07/26/2022 NEGATIVE  NEGATIVE Final   Comment: (NOTE) SARS-CoV-2 target nucleic acids are NOT DETECTED.  The SARS-CoV-2 RNA is generally detectable in upper respiratory specimens during the acute phase of infection. The lowest concentration of SARS-CoV-2 viral copies this assay can detect is 138 copies/mL. A negative result does not preclude SARS-Cov-2 infection and should not be used as the sole basis for treatment or other patient management decisions. A negative result may occur with  improper specimen collection/handling, submission of specimen other than nasopharyngeal swab, presence of viral mutation(s) within the areas targeted by this assay, and inadequate number of  viral copies(<138 copies/mL). A negative result must be combined with clinical observations, patient history, and epidemiological information. The expected result is Negative.  Fact Sheet for Patients:  EntrepreneurPulse.com.au  Fact Sheet for Healthcare Providers:  IncredibleEmployment.be  This test is no                          t yet approved or cleared by the [place] FDA and  has been authorized for detection and/or diagnosis of SARS-CoV-2 by FDA under an Emergency Use Authorization (EUA). This EUA will remain  in effect (meaning this test can be used) for the duration of the COVID-19 declaration under Section 564(b)(1) of the Act, 21 U.S.C.section 360bbb-3(b)(1), unless the authorization is terminated  or revoked sooner.       Influenza A by PCR 07/26/2022 NEGATIVE  NEGATIVE Final   Influenza B by PCR 07/26/2022 NEGATIVE  NEGATIVE Final   Comment: (NOTE) The Xpert Xpress SARS-CoV-2/FLU/RSV plus assay is intended as an aid in the diagnosis of influenza from Nasopharyngeal swab specimens and should not be used as a sole basis for treatment. Nasal washings and aspirates are unacceptable for Xpert Xpress SARS-CoV-2/FLU/RSV testing.  Fact Sheet for Patients: EntrepreneurPulse.com.au  Fact Sheet for Healthcare Providers: IncredibleEmployment.be  This test is not yet  approved or cleared by the [place] and has been authorized for detection and/or diagnosis of SARS-CoV-2 by FDA under an Emergency Use Authorization (EUA). This EUA will remain in effect (meaning this test can be used) for the duration of the COVID-19 declaration under Section 564(b)(1) of the Act, 21 U.S.C. section 360bbb-3(b)(1), unless the authorization is terminated or revoked.  Performed at [facility], [address] [place], [place] [address]    Troponin I (High Sensitivity) 07/26/2022 12  <18 ng/L Final   Comment:  (NOTE) Elevated high sensitivity troponin I (hsTnI) values and significant  changes across serial measurements may suggest ACS but many other  chronic and acute conditions are known to elevate hsTnI results.  Refer to the "Links" section for chest pain algorithms and additional  guidance. Performed at [facility], [address] [place], [place] [address]    Troponin I (High Sensitivity) 07/26/2022 13  <18 ng/L Final   Comment: (NOTE) Elevated high sensitivity troponin I (hsTnI) values and significant  changes across serial measurements may suggest ACS but many other  chronic and acute conditions are known to elevate hsTnI results.  Refer to the "Links" section for chest pain algorithms and additional  guidance. Performed at [facility], [address] [place], [place] [address]    Lipase 07/26/2022 220 (H)  11 - 51 U/L Final   Performed at [facility], [address], [place], [place] [address]    Blood Alcohol level:  Lab Results  Component Value Date   [facility] <10 [id number]    Metabolic Disorder Labs: Lab Results  Component Value Date   HGBA1C 5.5 01/01/2023   MPG 111 01/01/2023   No results found for: "PROLACTIN" Lab Results  Component Value Date   CHOL 90 12/19/2022   TRIG 53 12/19/2022   HDL 39 (L) 12/19/2022   CHOLHDL 2.3 12/19/2022   VLDL 11 12/19/2022   LDLCALC 40 12/19/2022    Therapeutic Lab Levels: No results found for: "LITHIUM" No results found for: "VALPROATE" No results found for: "CBMZ"  Physical Findings   PHQ2-9    Flowsheet Row ED from 01/01/2023 in [facility] ED from 10/04/2020 in [facility]  PHQ-2 Total Score 3 6  PHQ-9 Total Score 9 21      [facility] from 01/01/2023 in [facility] to Hosp-Admission (Discharged) from 12/18/2022 in [facility] ED from 10/09/2022 in [facility] Emergency Department at [place] No Risk No Risk No Risk        Musculoskeletal  Strength & Muscle Tone: within normal limits Gait & Station: normal Patient leans: N/A  Psychiatric Specialty Exam  Presentation  General Appearance: Appropriate for Environment; Casual  Eye Contact:Good  Speech:Clear and Coherent; Normal Rate  Speech Volume:Normal  Handedness:Right   Mood and Affect  Mood:Euthymic  Affect:Appropriate; Congruent   Thought Process  Thought Processes:Coherent; Linear  Descriptions of Associations:Intact  Orientation:Full (Time, Place and Person)  Thought Content:Logical; WDL  Diagnosis of Schizophrenia or Schizoaffective disorder in past: No    Hallucinations:Hallucinations: None  Ideas of Reference:None  Suicidal Thoughts:Suicidal Thoughts: No  Homicidal Thoughts:Homicidal Thoughts: No   Sensorium  Memory:Immediate Good; Recent Good  Judgment:Good  Insight:Good   Executive Functions  Concentration:Good  Attention Span:Good  [place] of Knowledge:Good  Language:Good   Psychomotor Activity  Psychomotor Activity:Psychomotor Activity: Normal   Assets  Assets:Communication Skills; Desire  for Improvement; Social Support   Sleep  Sleep:Sleep: Fair   No data recorded  Physical Exam  Vital signs reviewed Constitutional:      Appearance: Normal appearance.  Pulmonary:     Effort: Pulmonary effort is normal.  Neurological:     General: No focal deficit present.     Mental Status: Alert and oriented to person, place, and time.    Review of Systems  Constitutional: Negative.  Negative for chills, fever and weight loss.  HENT: Negative.    Eyes: Negative.   Respiratory: Negative.    Cardiovascular: Negative.   Gastrointestinal:  Negative for diarrhea and vomiting. Some nausea. Positive for constipation. Intermittent abdominal pain Genitourinary: Negative.   Musculoskeletal: Negative.   Skin: Negative.   Neurological: Negative.   Negative for tingling.   Blood pressure 114/84, pulse 97, temperature 97.9 F (36.6 C), temperature source Oral, resp. rate 18, last menstrual period 01/05/2021, SpO2 100 %. There is no height or weight on file to calculate BMI.  Treatment Plan Summary: Daily contact with patient to assess and evaluate symptoms and progress in treatment and Medication management  AUD - Continue CIWA protocol for monitoring of withdrawal with po thiamine and MVI replacement and Ativan 1mg  for scores >10  NRT - Continue Nicotine patch 14 mg/ 24 hr  Constipation - Miralax x 1  Continue PRN's: Tylenol, Maalox, Atarax, Milk of Magnesia, Trazodone, Zofran, Imodium  Dispo: Pending  Rosezetta Schlatter, MD 01/03/2023 5:32 PM

## 2023-01-03 NOTE — ED Notes (Signed)
Pt sitting in dayroom interacting with peers. No acute distress noted. No concerns voiced. Informed pt to notify staff with any needs or assistance. Pt verbalized understanding or agreement. Will continue to monitor for safety. 

## 2023-01-03 NOTE — Group Note (Signed)
Group Topic: Relaxation  Group Date: 01/03/2023 Start Time: 1100 End Time: 1145 Facilitators: Quentin Cornwall B  Department: [facility]  Number of Participants: 7  Group Focus: relaxation Treatment Modality:  Psychoeducation Interventions utilized were reality testing Purpose: express feelings  Name: Kaitlin Carpenter Date of Birth: 1971-05-11  MR: [id number]    Level of Participation: moderate Quality of Participation: attentive and cooperative Interactions with others: gave feedback Mood/Affect: positive Triggers (if applicable): na Cognition: coherent/clear Progress: Moderate Response: na Plan: follow-up needed  Patients Problems:  Patient Active Problem List   Diagnosis Date Noted   Acute pancreatitis [id number]   Acute alcoholic pancreatitis [id number]   Hyponatremia 12/19/2022   Hypokalemia 12/19/2022   Tobacco abuse 12/19/2022   Melena 12/19/2022   Alcohol abuse 10/04/2020

## 2023-01-03 NOTE — ED Notes (Signed)
Patient was looking at magazines in her room resting. Patient has been polite and kind to staff and other patients. Patient denies SI/HI and AVH. Patient is being monitored for safety.

## 2023-01-03 NOTE — ED Notes (Signed)
Pt a/o. Denies SI/HI/AVH. Denies c/o withdrawal symptoms. In dayroom watching tv with peers. No noted distress. Will continue to monitor for safety

## 2023-01-03 NOTE — ED Notes (Signed)
Patient A&Ox4. Denies intent to harm self/others when asked. Denies A/VH. Patient denies any physical complaints when asked. No acute distress noted. Routine safety checks conducted according to facility protocol. Encouraged patient to notify staff if thoughts of harm toward self or others arise. Patient verbalize understanding and agreement. Will continue to monitor for safety.    

## 2023-01-03 NOTE — ED Notes (Signed)
Patient is sleeping. Respirations equal and unlabored, skin warm and dry. No change in assessment or acuity. Routine safety checks conducted according to facility protocol. Will continue to monitor for safety.

## 2023-01-03 NOTE — ED Notes (Signed)
Pt sleeping in no acute distress. RR even and unlabored. Environment secured. Will continue to monitor for safety. 

## 2023-01-03 NOTE — ED Notes (Signed)
Patient is sleeping. Respirations equal and unlabored, skin warm and dry, NAD. No change in assessment or acuity. Routine safety checks conducted according to facility protocol. Will continue to monitor for safety.   

## 2023-01-04 DIAGNOSIS — Z8719 Personal history of other diseases of the digestive system: Secondary | ICD-10-CM | POA: Diagnosis not present

## 2023-01-04 DIAGNOSIS — F419 Anxiety disorder, unspecified: Secondary | ICD-10-CM | POA: Diagnosis not present

## 2023-01-04 DIAGNOSIS — F1010 Alcohol abuse, uncomplicated: Secondary | ICD-10-CM | POA: Diagnosis not present

## 2023-01-04 DIAGNOSIS — Z1152 Encounter for screening for COVID-19: Secondary | ICD-10-CM | POA: Diagnosis not present

## 2023-01-04 MED ORDER — SENNA 8.6 MG PO TABS
2.0000 | ORAL_TABLET | Freq: Every day | ORAL | Status: DC
  Administered 2023-01-05 – 2023-01-08 (×4): 17.2 mg via ORAL
  Filled 2023-01-04 (×2): qty 2
  Filled 2023-01-04: qty 24
  Filled 2023-01-04 (×2): qty 2

## 2023-01-04 MED ORDER — POLYETHYLENE GLYCOL 3350 17 G PO PACK
17.0000 g | PACK | Freq: Every day | ORAL | Status: DC
  Administered 2023-01-05 – 2023-01-08 (×3): 17 g via ORAL
  Filled 2023-01-04 (×4): qty 1

## 2023-01-04 NOTE — Group Note (Signed)
Group Topic: Healthy Self Image and Positive Change  Group Date: 01/04/2023 Start Time: 0745 End Time: 0810 Facilitators: Emmit Pomfret D, NT  Department: [facility]  Number of Participants: 9  Group Focus: coping skills, daily focus, and problem solving Treatment Modality:  Psychoeducation Interventions utilized were leisure development Purpose: increase insight  Name: Kaitlin Carpenter Date of Birth: Oct 29, 1970  MR: [id number]    Level of Participation: moderate Quality of Participation: cooperative Interactions with others: gave feedback Mood/Affect: appropriate Triggers (if applicable): n/a Cognition: coherent/clear Progress: Moderate Response: n/a Plan: follow-up needed  Patients Problems:  Patient Active Problem List   Diagnosis Date Noted   Acute pancreatitis 12/19/2022   Acute alcoholic pancreatitis 12/19/2022   Hyponatremia 12/19/2022   Hypokalemia 12/19/2022   Tobacco abuse 12/19/2022   Melena 12/19/2022   Alcohol abuse 10/04/2020

## 2023-01-04 NOTE — Discharge Instructions (Signed)
[facility] [address] [place], [place], [address] [phone] phone  New Patient Assessment/Therapy Walk-Ins:  Monday and Wednesday: 8 am until slots are full. Every 1st and 2nd Fridays of the month: 1 pm - 5 pm.  NO ASSESSMENT/THERAPY WALK-INS ON TUESDAYS OR THURSDAYS  New Patient Assessment/Medication Management Walk-Ins:  Monday - Friday:  8 am - 11 am.  For all walk-ins, we ask that you arrive by 7:30 am because patients will be seen in the order of arrival.  Availability is limited; therefore, you may not be seen on the same day that you walk-in.  Our goal is to serve and meet the needs of our community to the best of our ability.  SUBSTANCE USE TREATMENT for Medicaid and State Funded/IPRS  [facility] [address] [place], [place], [address] [phone] phone NOTE: [facility] is no longer offering IOP services.  Serves those who are low-income or have no insurance.  [facility] [address], [place], [place], [address] [phone] phone [phone] fax NOTE: Does have Substance Abuse-Intensive Outpatient Program (SAIOP) as well as transitional housing if eligible.  [facility] [address] [place], [place], [address] [phone] phone [phone] fax  [facility] [address] [place], [place], [address] [phone] phone [phone] fax  HALFWAY HOUSES:  [facility] [phone]  [facility] www.oxfordvacancies.com  12 STEP PROGRAMS:  Alcoholics Anonymous of [place] https://aagreensboronc.com/meeting  Narcotics Anonymous of [place] https://greensborona.org/meetings/  Al-Anon of [place], [place] www.greens[place]alanon.org/find-meetings.html  Nar-Anon https://nar-anon.org/find-a-meetin  List of Residential placements:   [facility] in [place]: [phone]  [facility]: [phone]  [facility]: [place], [place]  [phone]: Female and female facility; 30-day program: (uninsured and Medicaid such as [facility], [facility], [facility], partners)  [facility]: [phone]; men and women's facility; 28 days; Can have Medicaid tailored plan ([facility] or [facility])  [facility]: [phone] Ang[place] or [place]; 28 day program; must be fully detox; tailored Medicaid or no insurance  [facility] in [place], [place]; [phone]; 28 day all males program; no insurance accepted  [facility] Referral in [place]: [place] [phone] (no insurance or Medicaid only); 90 days; outpatient services but provide housing in apartments [place]  [facility] Admission: [phone]: Patient must complete phone screening for placement: [place], [place]; 6 month program; uninsured, Medicaid, and [facility] insurance.   [facility]: no insurance required; [phone]  [place] [facility]: [phone]; Intake: Robert; Must fill out application online; Victor Delay [phone] x [phone]  [facility] in [place], [place]: [phone]; Admissions Coordinators Mr. Dennis or David Gibson; 90 day program.  [facility]: [place], [place] [phone]; Co-Ed 9 month to a year program; Online application; Men entry fee is $500 (6-12months);  [facility]: [address] [place], [place] [address]; no fee or insurance required; minimum of 2 years; Highly structured; work based; Intake Coordinator is Chris [phone]  [facility] in [place], [place]: [phone]; Fax number is [phone]; website: www.[facility].org; Requires 3-6 page autobiography; 2 year program (18 months and then 6month transitional housing); Admission fee is $300; no insurance needed; work program  [facility] in [place], : Front Desk Staff: Reeci [phone]: They have a Men's Regenerations Program 6-9months. Free program; There is an initial $300 fee however, they are willing to work  with patients regarding that. Application is online.  [facility]: Admissions [phone] Benjamin Spera ext [phone]; Any 7-90 day program is out of pocket; 12   month program is free of charge; there is a $275 entry fee; Patient is responsible for own transportation 

## 2023-01-04 NOTE — ED Notes (Signed)
Patient is in room in no sxs of distress - will continue to monitor for safety  

## 2023-01-04 NOTE — Progress Notes (Signed)
LCSW received update from MD that patient is interested in residential placement at this time. Per MD, patient reports she was sent her by [facility] to detox and then admit into their facility. LCSW sent referral over to [facility] on yesterday for review and followed up on today. Patient demographics were provided to [facility] who reports she will pull referral, verify benefits, and follow up. No other needs were reported at this time.   Patient was not approved for [employer] as her insurance only covers detox.   LCSW will follow up to provide updates once received.   Fernande Boyden, LCSW Clinical Social Worker [facility] Ph: [phone]

## 2023-01-04 NOTE — ED Notes (Signed)
Patient in dayroom - no sxs of distress noted - will continue to monitor for safety 

## 2023-01-04 NOTE — ED Provider Notes (Signed)
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 Final   pH 10/09/2022 6.0  5.0 - 8.0 Final   Glucose, UA 10/09/2022 NEGATIVE  NEGATIVE mg/dL Final   Hgb urine dipstick 10/09/2022 SMALL (A)  NEGATIVE Final   Bilirubin Urine 10/09/2022 NEGATIVE  NEGATIVE Final   Ketones, ur 10/09/2022 80 (A)  NEGATIVE mg/dL Final   Protein, ur 02/72/5366 >=300 (A)  NEGATIVE mg/dL Final   Nitrite 44/11/4740 NEGATIVE  NEGATIVE Final   Leukocytes,Ua 10/09/2022 SMALL (A)  NEGATIVE Final   RBC / HPF 10/09/2022 6-10  0 - 5 RBC/hpf Final   WBC, UA 10/09/2022 21-50  0 - 5 WBC/hpf Final   Bacteria, UA 10/09/2022 MANY  (A)  NONE SEEN Final   Squamous Epithelial / HPF 10/09/2022 0-5  0 - 5 /HPF Final   Mucus 10/09/2022 PRESENT   Final   Performed at [facility], [address] [place], [place] [address]   Magnesium 10/09/2022 1.6 (L)  1.7 - 2.4 mg/dL Final   Performed at [facility], [address] [place], [place] [address]  Admission on 09/06/2022, Discharged on 09/06/2022  Component Date Value Ref Range Status   Lipase 09/06/2022 37  11 - 51 U/L Final   Performed at [facility], [address], [place], [place] [address]   Sodium 09/06/2022 143  135 - 145 mmol/L Final   Potassium 09/06/2022 3.9  3.5 - 5.1 mmol/L Final   Chloride 09/06/2022 105  98 - 111 mmol/L Final   CO2 09/06/2022 27  22 - 32 mmol/L Final   Glucose, Bld 09/06/2022 125 (H)  70 - 99 mg/dL Final   Glucose reference range applies only to samples taken after fasting for at least 8 hours.   BUN 09/06/2022 16  6 - 20 mg/dL Final  Creatinine, Ser 09/06/2022 0.72  0.44 - 1.00 mg/dL Final   Calcium 16/07/9603 9.0  8.9 - 10.3 mg/dL Final   Total Protein 54/06/8118 8.3 (H)  6.5 - 8.1 g/dL Final   Albumin 14/78/2956 4.2  3.5 - 5.0 g/dL Final   AST 21/30/8657 150 (H)  15 - 41 U/L Final   ALT 09/06/2022 110 (H)  0 - 44 U/L Final   Alkaline Phosphatase 09/06/2022 58  38 - 126 U/L Final   Total Bilirubin 09/06/2022 0.5  0.3 - 1.2 mg/dL Final   GFR, Estimated 09/06/2022 >60  >60 mL/min Final   Comment: (NOTE) Calculated using the CKD-EPI Creatinine Equation (2021)    Anion gap 09/06/2022 11  5 - 15 Final   Performed at [facility], [address], [place], [place] [address]   WBC 09/06/2022 7.5  4.0 - 10.5 K/uL Final   RBC 09/06/2022 3.61 (L)  3.87 - 5.11 MIL/uL Final   Hemoglobin 09/06/2022 14.0  12.0 - 15.0 g/dL Final   HCT 29/52/8413 40.2  36.0 - 46.0 % Final   MCV 09/06/2022 111.4 (H)  80.0 - 100.0 fL Final   MCH 09/06/2022 38.8 (H)  26.0 - 34.0 pg Final   MCHC 09/06/2022 34.8  30.0 - 36.0 g/dL  Final   RDW 24/40/1027 12.9  11.5 - 15.5 % Final   Platelets 09/06/2022 253  150 - 400 K/uL Final   nRBC 09/06/2022 0.0  0.0 - 0.2 % Final   Performed at [facility], [address], [place], [place] [address]   Color, Urine 09/06/2022 YELLOW  YELLOW Final   APPearance 09/06/2022 HAZY (A)  CLEAR Final   Specific Gravity, Urine 09/06/2022 1.020  1.005 - 1.030 Final   pH 09/06/2022 7.0  5.0 - 8.0 Final   Glucose, UA 09/06/2022 NEGATIVE  NEGATIVE mg/dL Final   Hgb urine dipstick 09/06/2022 NEGATIVE  NEGATIVE Final   Bilirubin Urine 09/06/2022 NEGATIVE  NEGATIVE Final   Ketones, ur 09/06/2022 NEGATIVE  NEGATIVE mg/dL Final   Protein, ur 44/11/4740 >=300 (A)  NEGATIVE mg/dL Final   Nitrite 59/56/3875 NEGATIVE  NEGATIVE Final   Leukocytes,Ua 09/06/2022 SMALL (A)  NEGATIVE Final   WBC, UA 09/06/2022 >50 (H)  0 - 5 WBC/hpf Final   Bacteria, UA 09/06/2022 RARE (A)  NONE SEEN Final   Squamous Epithelial / HPF 09/06/2022 11-20  0 - 5 Final   Mucus 09/06/2022 PRESENT   Final   Performed at [facility], [address] [place], [place] [address]  Admission on 07/26/2022, Discharged on 07/26/2022  Component Date Value Ref Range Status   WBC 07/26/2022 8.6  4.0 - 10.5 K/uL Final   RBC 07/26/2022 3.88  3.87 - 5.11 MIL/uL Final   Hemoglobin 07/26/2022 14.9  12.0 - 15.0 g/dL Final   HCT 95/18/8416 40.9  36.0 - 46.0 % Final   MCV 07/26/2022 105.4 (H)  80.0 - 100.0 fL Final   MCH 07/26/2022 38.4 (H)  26.0 - 34.0 pg Final   MCHC 07/26/2022 36.4 (H)  30.0 - 36.0 g/dL Final   RDW 60/63/0160 12.4  11.5 - 15.5 % Final   Platelets 07/26/2022 165  150 - 400 K/uL Final   nRBC 07/26/2022 0.0  0.0 - 0.2 % Final   Neutrophils Relative % 07/26/2022 83  % Final   Neutro Abs 07/26/2022 7.2  1.7 - 7.7 K/uL Final   Lymphocytes Relative 07/26/2022 11  % Final   Lymphs Abs 07/26/2022 0.9  0.7 - 4.0 K/uL  Final   Monocytes Relative 07/26/2022 5  % Final   Monocytes Absolute 07/26/2022 0.4  0.1 - 1.0 K/uL Final    Eosinophils Relative 07/26/2022 0  % Final   Eosinophils Absolute 07/26/2022 0.0  0.0 - 0.5 K/uL Final   Basophils Relative 07/26/2022 1  % Final   Basophils Absolute 07/26/2022 0.0  0.0 - 0.1 K/uL Final   Immature Granulocytes 07/26/2022 0  % Final   Abs Immature Granulocytes 07/26/2022 0.03  0.00 - 0.07 K/uL Final   Performed at [facility], [address] [place], [place] [address]   Sodium 07/26/2022 136  135 - 145 mmol/L Final   Potassium 07/26/2022 2.8 (L)  3.5 - 5.1 mmol/L Final   Chloride 07/26/2022 95 (L)  98 - 111 mmol/L Final   CO2 07/26/2022 28  22 - 32 mmol/L Final   Glucose, Bld 07/26/2022 120 (H)  70 - 99 mg/dL Final   Glucose reference range applies only to samples taken after fasting for at least 8 hours.   BUN 07/26/2022 23 (H)  6 - 20 mg/dL Final   Creatinine, Ser 07/26/2022 0.67  0.44 - 1.00 mg/dL Final   Calcium 21/30/865710/26/2023 9.7  8.9 - 10.3 mg/dL Final   Total Protein 84/69/629510/26/2023 9.1 (H)  6.5 - 8.1 g/dL Final   Albumin 28/41/324410/26/2023 4.8  3.5 - 5.0 g/dL Final   AST 01/02/725310/26/2023 191 (H)  15 - 41 U/L Final   ALT 07/26/2022 132 (H)  0 - 44 U/L Final   Alkaline Phosphatase 07/26/2022 73  38 - 126 U/L Final   Total Bilirubin 07/26/2022 1.2  0.3 - 1.2 mg/dL Final   GFR, Estimated 07/26/2022 >60  >60 mL/min Final   Comment: (NOTE) Calculated using the CKD-EPI Creatinine Equation (2021)    Anion gap 07/26/2022 13  5 - 15 Final   Performed at [facility], [address] [place], [place] [address]   SARS Coronavirus 2 by RT PCR 07/26/2022 NEGATIVE  NEGATIVE Final   Comment: (NOTE) SARS-CoV-2 target nucleic acids are NOT DETECTED.  The SARS-CoV-2 RNA is generally detectable in upper respiratory specimens during the acute phase of infection. The lowest concentration of SARS-CoV-2 viral copies this assay can detect is 138 copies/mL. A negative result does not preclude SARS-Cov-2 infection and should not be used as the sole basis for treatment or other patient management  decisions. A negative result may occur with  improper specimen collection/handling, submission of specimen other than nasopharyngeal swab, presence of viral mutation(s) within the areas targeted by this assay, and inadequate number of viral copies(<138 copies/mL). A negative result must be combined with clinical observations, patient history, and epidemiological information. The expected result is Negative.  Fact Sheet for Patients:  BloggerCourse.comhttps://www.fda.gov/media/152166/download  Fact Sheet for Healthcare Providers:  SeriousBroker.ithttps://www.fda.gov/media/152162/download  This test is no                          t yet approved or cleared by the [place] FDA and  has been authorized for detection and/or diagnosis of SARS-CoV-2 by FDA under an Emergency Use Authorization (EUA). This EUA will remain  in effect (meaning this test can be used) for the duration of the COVID-19 declaration under Section 564(b)(1) of the Act, 21 U.S.C.section 360bbb-3(b)(1), unless the authorization is terminated  or revoked sooner.       Influenza A by PCR 07/26/2022 NEGATIVE  NEGATIVE Final   Influenza B by PCR 07/26/2022 NEGATIVE  NEGATIVE  Final   Comment: (NOTE) The Xpert Xpress SARS-CoV-2/FLU/RSV plus assay is intended as an aid in the diagnosis of influenza from Nasopharyngeal swab specimens and should not be used as a sole basis for treatment. Nasal washings and aspirates are unacceptable for Xpert Xpress SARS-CoV-2/FLU/RSV testing.  Fact Sheet for Patients: BloggerCourse.com  Fact Sheet for Healthcare Providers: SeriousBroker.it  This test is not yet approved or cleared by the [place] FDA and has been authorized for detection and/or diagnosis of SARS-CoV-2 by FDA under an Emergency Use Authorization (EUA). This EUA will remain in effect (meaning this test can be used) for the duration of the COVID-19 declaration under Section 564(b)(1) of the Act,  21 U.S.C. section 360bbb-3(b)(1), unless the authorization is terminated or revoked.  Performed at [facility], [address] [place], [place] [address]    Troponin I (High Sensitivity) 07/26/2022 12  <18 ng/L Final   Comment: (NOTE) Elevated high sensitivity troponin I (hsTnI) values and significant  changes across serial measurements may suggest ACS but many other  chronic and acute conditions are known to elevate hsTnI results.  Refer to the "Links" section for chest pain algorithms and additional  guidance. Performed at [facility], [address] [place], [place] [address]    Troponin I (High Sensitivity) 07/26/2022 13  <18 ng/L Final   Comment: (NOTE) Elevated high sensitivity troponin I (hsTnI) values and significant  changes across serial measurements may suggest ACS but many other  chronic and acute conditions are known to elevate hsTnI results.  Refer to the "Links" section for chest pain algorithms and additional  guidance. Performed at [facility], [address], [place], [place] [address]    Lipase 07/26/2022 220 (H)  11 - 51 U/L Final   Performed at [facility], [address] [place], [place] [address]    Blood Alcohol level:  Lab Results  Component Value Date   [facility] <10 01/01/2023    Metabolic Disorder Labs: Lab Results  Component Value Date   HGBA1C 5.5 01/01/2023   MPG 111 01/01/2023   No results found for: "PROLACTIN" Lab Results  Component Value Date   CHOL 90 12/19/2022   TRIG 53 12/19/2022   HDL 39 (L) 12/19/2022   CHOLHDL 2.3 12/19/2022   VLDL 11 12/19/2022   LDLCALC 40 12/19/2022    Therapeutic Lab Levels: No results found for: "LITHIUM" No results found for: "VALPROATE" No results found for: "CBMZ"  Physical Findings   PHQ2-9    Flowsheet Row ED from 01/01/2023 in [facility] ED from 10/04/2020 in [facility]  PHQ-2 Total Score 3 6  PHQ-9 Total Score 9 21      Flowsheet  Row ED from 01/01/2023 in [facility] ED to Hosp-Admission (Discharged) from 12/18/2022 in [facility] ED from 10/09/2022 in [facility]  C-SSRS RISK CATEGORY No Risk No Risk No Risk        Musculoskeletal  Strength & Muscle Tone: within normal limits Gait & Station: normal Patient leans: N/A  Psychiatric Specialty Exam  General Appearance: appears at stated age, casually dressed with hospital scrub top and groomed  Behavior: pleasant and cooperative   Psychomotor Activity: no psychomotor agitation or retardation noted   Eye Contact: fair  Speech: normal amount, tone, volume and fluency    Mood: euthymic  Affect: congruent, pleasant and interactive   Thought Process: linear, goal directed, no circumstantial  or tangential thought process noted, no racing thoughts or flight of ideas  Descriptions of Associations: intact   Thought Content Hallucinations: denies AH, VH , does not appear responding to stimuli  Delusions: no paranoia, delusions of control, grandeur, ideas of reference, thought broadcasting, and magical thinking  Suicidal Thoughts: denies SI, intention, plan  Homicidal Thoughts: denies HI, intention, plan   Alertness/Orientation: alert and fully oriented   Insight: fair Judgment: fair  Memory: intact   Executive Functions  Concentration: intact  Attention Span: fair  Recall: intact  Fund of Knowledge: fair   Psychomotor Activity  Psychomotor Activity: Psychomotor Activity: Normal  Assets  Assets: Manufacturing systems engineer; Desire for Improvement; Social Support  Sleep  Sleep: Sleep: Fair  No data recorded  Physical Exam  Physical Exam  Constitutional:      Appearance: Normal appearance.  Pulmonary:     Effort: Pulmonary effort is normal.  Neurological:     General: No focal deficit present.     Mental Status: Alert and oriented to person, place, and time.     Review of Systems  Constitutional: Negative.  Negative for chills, fever and weight loss.  HENT: Negative.    Eyes: Negative.   Respiratory: Negative.    Cardiovascular: Negative.   Gastrointestinal:  Negative for diarrhea, nausea and vomiting. Positive for constipation. Genitourinary: Negative.   Musculoskeletal: Baseline back pain.   Skin: Negative.   Neurological: Negative.  Negative for tingling.   Blood pressure 100/74, pulse 71, temperature 98 F (36.7 C), temperature source Oral, resp. rate 18, last menstrual period 01/05/2021, SpO2 99 %. There is no height or weight on file to calculate BMI.  Treatment Plan Summary: Daily contact with patient to assess and evaluate symptoms and progress in treatment Continue PRN's: Tylenol, Maalox, Atarax, Milk of Magnesia, Trazodone, Zofran, Imodium, Miralax  AUD - Continue CIWA protocol for monitoring of withdrawal with po thiamine and MVI replacement and Ativan 1mg  for scores >10   NRT - Continue Nicotine patch 14 mg/24 hr   Constipation Pt continues to report constipation despite receiving Miralax yesterday - Will add Senna to promote BM - Encouraged continued eating and hydration  Dispo - Pending confirmation of bed at [facility], Medical Student 01/04/2023 11:27 AM  Attestation for Student Documentation:  I personally was present and performed or re-performed the history, psychiatric specialty exam and medical decision-making activities of this service and have verified that the service and findings are accurately documented in the student's note.  Lamar Sprinkles, MD 01/04/2023, 1:55 PM

## 2023-01-04 NOTE — ED Notes (Signed)
Pt observed/assessed in room sleeping. RR even and unlabored, appearing in no noted distress. Environmental check complete, will continue to monitor for safety 

## 2023-01-04 NOTE — Progress Notes (Signed)
Kaitlin Carpenter remained visible in the milieu throughout the morning. She attended the morning group therapy sessions and completed her Safety plan. A copy was placed in her chart and she received a copy to take home.

## 2023-01-04 NOTE — Progress Notes (Signed)
Received Kaitlin Carpenter this AM asleep in her bed, she woke up on her own, received breakfast and was compliant with her medications. She denied all of the psychiatric systems this AM, She endorsed generalized body aches  and rated it 7/10. Medicated with Tylenol per her request.

## 2023-01-04 NOTE — ED Notes (Signed)
Patient is sleeping. Respirations equal and unlabored, skin warm and dry, NAD. No change in assessment or acuity. Routine safety checks conducted according to facility protocol. Will continue to monitor for safety.   

## 2023-01-05 DIAGNOSIS — F419 Anxiety disorder, unspecified: Secondary | ICD-10-CM | POA: Diagnosis not present

## 2023-01-05 DIAGNOSIS — Z8719 Personal history of other diseases of the digestive system: Secondary | ICD-10-CM | POA: Diagnosis not present

## 2023-01-05 DIAGNOSIS — F1010 Alcohol abuse, uncomplicated: Secondary | ICD-10-CM | POA: Diagnosis not present

## 2023-01-05 DIAGNOSIS — Z1152 Encounter for screening for COVID-19: Secondary | ICD-10-CM | POA: Diagnosis not present

## 2023-01-05 MED ORDER — NICOTINE 21 MG/24HR TD PT24
21.0000 mg | MEDICATED_PATCH | Freq: Every day | TRANSDERMAL | Status: DC
  Administered 2023-01-06 – 2023-01-08 (×3): 21 mg via TRANSDERMAL
  Filled 2023-01-05 (×17): qty 1

## 2023-01-05 MED ORDER — BISACODYL 10 MG RE SUPP
10.0000 mg | Freq: Once | RECTAL | Status: AC
  Administered 2023-01-05: 10 mg via RECTAL
  Filled 2023-01-05: qty 1

## 2023-01-05 NOTE — ED Notes (Signed)
Pt attending AA 

## 2023-01-05 NOTE — ED Notes (Signed)
Patient awake and alert on unit without distress or complaint.  She is presently eating breakfast in dayroom.  She is calm and quiet on unit.  No withdrawal symptoms noted.  Denies avh shi or plan at this time.  Will monitor and provide a safe environment.

## 2023-01-05 NOTE — ED Notes (Signed)
Patient is in room in no sxs of distress - will continue to monitor for safety  

## 2023-01-05 NOTE — ED Notes (Signed)
Patient is awake and alert sitting in dayroom socializing with peers.  Patient has been calm and cooperative with care.  No complaints or distress.  Makes needs known.  Will monitor.

## 2023-01-05 NOTE — ED Notes (Signed)
Patient A&Ox4. Patient denies SI/Hi and AVH. Patient complained of headache rating pain 8/10. Tylenol 650 mg was given and was effective. Pt reports no more headache. No acute distress noted. Support and encouragement provided. Routine safety checks conducted according to facility protocol. Encouraged patient to notify staff if thoughts of harm toward self or others arise. Patient verbalize understanding and agreement. Will continue to monitor for safety.

## 2023-01-05 NOTE — ED Provider Notes (Signed)
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   Specific Gravity, Urine 10/09/2022 1.020  1.005 - 1.030 Final   pH 10/09/2022 6.0  5.0 - 8.0 Final   Glucose, UA 10/09/2022 NEGATIVE  NEGATIVE mg/dL Final   Hgb urine dipstick 10/09/2022 SMALL (A)  NEGATIVE Final   Bilirubin Urine 10/09/2022 NEGATIVE  NEGATIVE Final   Ketones, ur 10/09/2022 80 (A)  NEGATIVE mg/dL Final   Protein, ur 41/32/4401 >=300 (A)  NEGATIVE mg/dL Final   Nitrite 02/72/5366 NEGATIVE  NEGATIVE Final   Leukocytes,Ua 10/09/2022 SMALL (A)  NEGATIVE Final   RBC / HPF 10/09/2022 6-10  0 - 5 RBC/hpf Final   WBC, UA 10/09/2022 21-50  0 - 5 WBC/hpf Final   Bacteria, UA 10/09/2022 MANY  (A)  NONE SEEN Final   Squamous Epithelial / HPF 10/09/2022 0-5  0 - 5 /HPF Final   Mucus 10/09/2022 PRESENT   Final   Performed at [facility], [address] [place], [place] [address]   Magnesium 10/09/2022 1.6 (L)  1.7 - 2.4 mg/dL Final   Performed at [facility], [address] [place], [place] [address]  Admission on 09/06/2022, Discharged on 09/06/2022  Component Date Value Ref Range Status   Lipase 09/06/2022 37  11 - 51 U/L Final   Performed at [facility], [address] [place], [place] [address]   Sodium 09/06/2022 143  135 - 145 mmol/L Final   Potassium 09/06/2022 3.9  3.5 - 5.1 mmol/L Final   Chloride 09/06/2022 105  98 - 111 mmol/L Final   CO2 09/06/2022 27  22 - 32 mmol/L Final   Glucose, Bld 09/06/2022 125 (H)  70 - 99 mg/dL Final   Glucose reference range applies only to samples taken after fasting for at least 8 hours.   BUN 09/06/2022 16  6 - 20 mg/dL Final   Creatinine, Ser 09/06/2022 0.72  0.44 -  1.00 mg/dL Final   Calcium 16/07/9603 9.0  8.9 - 10.3 mg/dL Final   Total Protein 54/06/8118 8.3 (H)  6.5 - 8.1 g/dL Final   Albumin 14/78/2956 4.2  3.5 - 5.0 g/dL Final   AST 21/30/8657 150 (H)  15 - 41 U/L Final   ALT 09/06/2022 110 (H)  0 - 44 U/L Final   Alkaline Phosphatase 09/06/2022 58  38 - 126 U/L Final   Total Bilirubin 09/06/2022 0.5  0.3 - 1.2 mg/dL Final   GFR, Estimated 09/06/2022 >60  >60 mL/min Final   Comment: (NOTE) Calculated using the CKD-EPI Creatinine Equation (2021)    Anion gap 09/06/2022 11  5 - 15 Final   Performed at [facility], [address] [place], [place] [address]   WBC 09/06/2022 7.5  4.0 - 10.5 K/uL Final   RBC 09/06/2022 3.61 (L)  3.87 - 5.11 MIL/uL Final   Hemoglobin 09/06/2022 14.0  12.0 - 15.0 g/dL Final   HCT 29/52/8413 40.2  36.0 - 46.0 % Final   MCV 09/06/2022 111.4 (H)  80.0 - 100.0 fL Final   MCH 09/06/2022 38.8 (H)  26.0 - 34.0 pg Final   MCHC 09/06/2022 34.8  30.0 - 36.0 g/dL  Final   RDW 24/40/1027 12.9  11.5 - 15.5 % Final   Platelets 09/06/2022 253  150 - 400 K/uL Final   nRBC 09/06/2022 0.0  0.0 - 0.2 % Final   Performed at [facility], [address] [place], [place] [address]   Color, Urine 09/06/2022 YELLOW  YELLOW Final   APPearance 09/06/2022 HAZY (A)  CLEAR Final   Specific Gravity, Urine 09/06/2022 1.020  1.005 - 1.030 Final   pH 09/06/2022 7.0  5.0 - 8.0 Final   Glucose, UA 09/06/2022 NEGATIVE  NEGATIVE mg/dL Final   Hgb urine dipstick 09/06/2022 NEGATIVE  NEGATIVE Final   Bilirubin Urine 09/06/2022 NEGATIVE  NEGATIVE Final   Ketones, ur 09/06/2022 NEGATIVE  NEGATIVE mg/dL Final   Protein, ur 44/11/4740 >=300 (A)  NEGATIVE mg/dL Final   Nitrite 59/56/3875 NEGATIVE  NEGATIVE Final   Leukocytes,Ua 09/06/2022 SMALL (A)  NEGATIVE Final   WBC, UA 09/06/2022 >50 (H)  0 - 5 WBC/hpf Final   Bacteria, UA 09/06/2022 RARE (A)  NONE SEEN Final   Squamous Epithelial / HPF 09/06/2022 11-20  0 - 5 Final   Mucus 09/06/2022 PRESENT   Final   Performed at [facility], [address], [place], [place] [address]  Admission on 07/26/2022, Discharged on 07/26/2022  Component Date Value Ref Range Status   WBC 07/26/2022 8.6  4.0 - 10.5 K/uL Final   RBC 07/26/2022 3.88  3.87 - 5.11 MIL/uL Final   Hemoglobin 07/26/2022 14.9  12.0 - 15.0 g/dL Final   HCT 95/18/8416 40.9  36.0 - 46.0 % Final   MCV 07/26/2022 105.4 (H)  80.0 - 100.0 fL Final   MCH 07/26/2022 38.4 (H)  26.0 - 34.0 pg Final   MCHC 07/26/2022 36.4 (H)  30.0 - 36.0 g/dL Final   RDW 60/63/0160 12.4  11.5 - 15.5 % Final   Platelets 07/26/2022 165  150 - 400 K/uL Final   nRBC 07/26/2022 0.0  0.0 - 0.2 % Final   Neutrophils Relative % 07/26/2022 83  % Final   Neutro Abs 07/26/2022 7.2  1.7 - 7.7 K/uL Final   Lymphocytes Relative 07/26/2022 11  % Final   Lymphs Abs 07/26/2022 0.9  0.7 - 4.0 K/uL Final   Monocytes Relative 07/26/2022 5  %  Final   Monocytes Absolute 07/26/2022 0.4  0.1 - 1.0 K/uL Final    Eosinophils Relative 07/26/2022 0  % Final   Eosinophils Absolute 07/26/2022 0.0  0.0 - 0.5 K/uL Final   Basophils Relative 07/26/2022 1  % Final   Basophils Absolute 07/26/2022 0.0  0.0 - 0.1 K/uL Final   Immature Granulocytes 07/26/2022 0  % Final   Abs Immature Granulocytes 07/26/2022 0.03  0.00 - 0.07 K/uL Final   Performed at [facility], [address], [place], [place] [address]   Sodium 07/26/2022 136  135 - 145 mmol/L Final   Potassium 07/26/2022 2.8 (L)  3.5 - 5.1 mmol/L Final   Chloride 07/26/2022 95 (L)  98 - 111 mmol/L Final   CO2 07/26/2022 28  22 - 32 mmol/L Final   Glucose, Bld 07/26/2022 120 (H)  70 - 99 mg/dL Final   Glucose reference range applies only to samples taken after fasting for at least 8 hours.   BUN 07/26/2022 23 (H)  6 - 20 mg/dL Final   Creatinine, Ser 07/26/2022 0.67  0.44 - 1.00 mg/dL Final   Calcium 66/44/0347 9.7  8.9 - 10.3 mg/dL Final   Total Protein 42/59/5638 9.1 (H)  6.5 - 8.1 g/dL Final   Albumin 75/64/3329 4.8  3.5 - 5.0 g/dL Final   AST 51/88/4166 191 (H)  15 - 41 U/L Final   ALT 07/26/2022 132 (H)  0 - 44 U/L Final   Alkaline Phosphatase 07/26/2022 73  38 - 126 U/L Final   Total Bilirubin 07/26/2022 1.2  0.3 - 1.2 mg/dL Final   GFR, Estimated 07/26/2022 >60  >60 mL/min Final   Comment: (NOTE) Calculated using the CKD-EPI Creatinine Equation (2021)    Anion gap 07/26/2022 13  5 - 15 Final   Performed at [facility], [address] [place], [place] [address]   SARS Coronavirus 2 by RT PCR 07/26/2022 NEGATIVE  NEGATIVE Final   Comment: (NOTE) SARS-CoV-2 target nucleic acids are NOT DETECTED.  The SARS-CoV-2 RNA is generally detectable in upper respiratory specimens during the acute phase of infection. The lowest concentration of SARS-CoV-2 viral copies this assay can detect is 138 copies/mL. A negative result does not preclude SARS-Cov-2 infection and should not be used as the sole basis for treatment or other patient management  decisions. A negative result may occur with  improper specimen collection/handling, submission of specimen other than nasopharyngeal swab, presence of viral mutation(s) within the areas targeted by this assay, and inadequate number of viral copies(<138 copies/mL). A negative result must be combined with clinical observations, patient history, and epidemiological information. The expected result is Negative.  Fact Sheet for Patients:  BloggerCourse.com  Fact Sheet for Healthcare Providers:  SeriousBroker.it  This test is no                          t yet approved or cleared by the [place] FDA and  has been authorized for detection and/or diagnosis of SARS-CoV-2 by FDA under an Emergency Use Authorization (EUA). This EUA will remain  in effect (meaning this test can be used) for the duration of the COVID-19 declaration under Section 564(b)(1) of the Act, 21 U.S.C.section 360bbb-3(b)(1), unless the authorization is terminated  or revoked sooner.       Influenza A by PCR 07/26/2022 NEGATIVE  NEGATIVE Final   Influenza B by PCR 07/26/2022 NEGATIVE  NEGATIVE Final   Comment: (NOTE) The Xpert Xpress SARS-CoV-2/FLU/RSV  plus assay is intended as an aid in the diagnosis of influenza from Nasopharyngeal swab specimens and should not be used as a sole basis for treatment. Nasal washings and aspirates are unacceptable for Xpert Xpress SARS-CoV-2/FLU/RSV testing.  Fact Sheet for Patients: BloggerCourse.com  Fact Sheet for Healthcare Providers: SeriousBroker.it  This test is not yet approved or cleared by the [place] FDA and has been authorized for detection and/or diagnosis of SARS-CoV-2 by FDA under an Emergency Use Authorization (EUA). This EUA will remain in effect (meaning this test can be used) for the duration of the COVID-19 declaration under Section 564(b)(1) of the Act,  21 U.S.C. section 360bbb-3(b)(1), unless the authorization is terminated or revoked.  Performed at [facility], [address], [place], [place] [address]    Troponin I (High Sensitivity) 07/26/2022 12  <18 ng/L Final   Comment: (NOTE) Elevated high sensitivity troponin I (hsTnI) values and significant  changes across serial measurements may suggest ACS but many other  chronic and acute conditions are known to elevate hsTnI results.  Refer to the "Links" section for chest pain algorithms and additional  guidance. Performed at [facility], [address], [place], [place] [address]    Troponin I (High Sensitivity) 07/26/2022 13  <18 ng/L Final   Comment: (NOTE) Elevated high sensitivity troponin I (hsTnI) values and significant  changes across serial measurements may suggest ACS but many other  chronic and acute conditions are known to elevate hsTnI results.  Refer to the "Links" section for chest pain algorithms and additional  guidance. Performed at [facility], [address], [place], [place] [address]    Lipase 07/26/2022 220 (H)  11 - 51 U/L Final   Performed at [facility], [address], [place], [place] [address]    Blood Alcohol level:  Lab Results  Component Value Date   [facility] <10 01/01/2023    Metabolic Disorder Labs: Lab Results  Component Value Date   HGBA1C 5.5 01/01/2023   MPG 111 01/01/2023   No results found for: "PROLACTIN" Lab Results  Component Value Date   CHOL 90 12/19/2022   TRIG 53 12/19/2022   HDL 39 (L) 12/19/2022   CHOLHDL 2.3 12/19/2022   VLDL 11 12/19/2022   LDLCALC 40 12/19/2022    Therapeutic Lab Levels: No results found for: "LITHIUM" No results found for: "VALPROATE" No results found for: "CBMZ"  Physical Findings   PHQ2-9    Flowsheet Row ED from 01/01/2023 in [facility] ED from 10/04/2020 in [facility]  PHQ-2 Total Score 1 6  PHQ-9 Total Score 9 21      Flowsheet  Row ED from 01/01/2023 in [facility] ED to Hosp-Admission (Discharged) from 12/18/2022 in [facility] ED from 10/09/2022 in [facility] Emergency Department at [facility]  C-SSRS RISK CATEGORY No Risk No Risk No Risk        Musculoskeletal  Strength & Muscle Tone: within normal limits Gait & Station: normal Patient leans: N/A  Psychiatric Specialty Exam  General Appearance: appears at stated age, casually dressed with hospital scrub top and groomed  Behavior: pleasant and cooperative   Psychomotor Activity: no psychomotor agitation or retardation noted   Eye Contact: fair  Speech: normal amount, tone, volume and fluency    Mood: Dysphoric Affect: Constricted  Thought Process: linear, goal directed, no circumstantial or tangential thought process noted, no racing thoughts or flight of ideas  Descriptions  of Associations: intact   Thought Content Hallucinations: denies AH, VH , does not appear responding to stimuli  Delusions: no paranoia, delusions of control, grandeur, ideas of reference, thought broadcasting, and magical thinking  Suicidal Thoughts: denies SI, intention, plan  Homicidal Thoughts: denies HI, intention, plan   Alertness/Orientation: alert and fully oriented   Insight: fair Judgment: fair  Memory: intact   Executive Functions  Concentration: intact  Attention Span: fair  Recall: intact  Fund of Knowledge: fair   Psychomotor Activity  Psychomotor Activity: Normal  Assets  Assets: Manufacturing systems engineer; Desire for Improvement; Social Support  Sleep  Sleep: Good  No data recorded  Physical Exam   Review of Systems  Constitutional: Negative.  Negative for chills, fever and weight loss.  HENT: Negative.    Eyes: Negative.   Respiratory: Negative.    Cardiovascular: Negative.   Gastrointestinal:  Negative for diarrhea, nausea and vomiting. Positive for constipation. Genitourinary:  Negative.   Musculoskeletal: Negative Skin: Negative.   Neurological: Negative.  Negative for tingling.   Blood pressure 110/78, pulse 71, temperature 98.5 F (36.9 C), temperature source Oral, resp. rate 16, last menstrual period 01/05/2021, SpO2 99 %. There is no height or weight on file to calculate BMI.  Treatment Plan Summary: Daily contact with patient to assess and evaluate symptoms and progress in treatment Continue PRN's: Tylenol, Maalox, Atarax, Milk of Magnesia, Trazodone, Zofran, Imodium, Miralax  AUD - Continue  po thiamine and MVI replacement  NRT - Continue Nicotine patch 14 mg/24 hr   Constipation Pt continues to report constipation despite receiving Miralax yesterday -Senna, MiraLAX and MOM ineffective. -Add Dulcolax suppository once. - Encouraged continued eating and hydration  Dispo - Pending confirmation of bed at Laurena Spies, MD 01/05/2023, 3:36 PM

## 2023-01-06 DIAGNOSIS — Z8719 Personal history of other diseases of the digestive system: Secondary | ICD-10-CM | POA: Diagnosis not present

## 2023-01-06 DIAGNOSIS — Z1152 Encounter for screening for COVID-19: Secondary | ICD-10-CM | POA: Diagnosis not present

## 2023-01-06 DIAGNOSIS — F1010 Alcohol abuse, uncomplicated: Secondary | ICD-10-CM | POA: Diagnosis not present

## 2023-01-06 DIAGNOSIS — F419 Anxiety disorder, unspecified: Secondary | ICD-10-CM | POA: Diagnosis not present

## 2023-01-06 NOTE — ED Notes (Signed)
Patient observed/assessed in room in bed appearing in no immediate distress resting peacefully. Q15 minute checks continued by MHT and nursing staff. Will continue to monitor and support. 

## 2023-01-06 NOTE — Group Note (Signed)
Group Topic: Understanding Self  Group Date: 01/06/2023 Start Time: 1630 End Time: 1704 Facilitators: Vonzell Schlatter B  Department: [facility]  Number of Participants: 8  Group Focus: self-awareness Treatment Modality:  Psychoeducation Interventions utilized were problem solving Purpose: express feelings  Name: Kaitlin Carpenter Date of Birth: 1970/11/09  MR: [id number]    Level of Participation: moderate Quality of Participation: attentive and cooperative Interactions with others: gave feedback Mood/Affect: positive Triggers (if applicable): na Cognition: coherent/clear Progress: Moderate Response: na Plan: follow-up needed  Patients Problems:  Patient Active Problem List   Diagnosis Date Noted   Acute pancreatitis 12/19/2022   Acute alcoholic pancreatitis 12/19/2022   Hyponatremia 12/19/2022   Hypokalemia 12/19/2022   Tobacco abuse 12/19/2022   Melena 12/19/2022   Alcohol abuse 10/04/2020

## 2023-01-06 NOTE — ED Notes (Signed)
Snacks given 

## 2023-01-06 NOTE — ED Notes (Signed)
Pt sitting in dayroom interacting with peers. No acute distress noted. No concerns voiced. Informed pt to notify staff with any needs or assistance. Pt verbalized understanding or agreement. Will continue to monitor for safety. 

## 2023-01-06 NOTE — ED Notes (Signed)
Patient A&Ox4. Denies intent to harm self/others when asked. Denies A/VH. Patient denies any physical complaints when asked. No acute distress noted. Support and encouragement provided. Routine safety checks conducted according to facility protocol. Encouraged patient to notify staff if thoughts of harm toward self or others arise. Patient verbalize understanding and agreement. Will continue to monitor for safety.    

## 2023-01-06 NOTE — ED Notes (Signed)
Patient is currently sitting in the dining room watching television, no distress noted, will continue to monitor patient for safety. 

## 2023-01-06 NOTE — ED Provider Notes (Cosign Needed Addendum)
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 - 8.0 Final   Glucose, UA 10/09/2022 NEGATIVE  NEGATIVE mg/dL Final   Hgb urine dipstick 10/09/2022 SMALL (A)  NEGATIVE Final   Bilirubin Urine 10/09/2022 NEGATIVE  NEGATIVE Final   Ketones, ur 10/09/2022 80 (A)  NEGATIVE mg/dL Final   Protein, ur 46/96/2952 >=300 (A)  NEGATIVE mg/dL Final   Nitrite 84/13/2440 NEGATIVE  NEGATIVE Final   Leukocytes,Ua 10/09/2022 SMALL (A)  NEGATIVE Final   RBC / HPF 10/09/2022 6-10  0 - 5 RBC/hpf Final   WBC, UA 10/09/2022 21-50  0 - 5 WBC/hpf Final   Bacteria, UA 10/09/2022 MANY (A)  NONE SEEN Final   Squamous Epithelial / HPF 10/09/2022 0-5  0 - 5 /HPF Final   Mucus 10/09/2022 PRESENT    Final   Performed at [facility], [address] [place], [place] [address]   Magnesium 10/09/2022 1.6 (L)  1.7 - 2.4 mg/dL Final   Performed at [facility], [address] [place], [place] [address]  Admission on 09/06/2022, Discharged on 09/06/2022  Component Date Value Ref Range Status   Lipase 09/06/2022 37  11 - 51 U/L Final   Performed at [facility], [address], [place], [place] [address]   Sodium 09/06/2022 143  135 - 145 mmol/L Final   Potassium 09/06/2022 3.9  3.5 - 5.1 mmol/L Final   Chloride 09/06/2022 105  98 - 111 mmol/L Final   CO2 09/06/2022 27  22 - 32 mmol/L Final   Glucose, Bld 09/06/2022 125 (H)  70 - 99 mg/dL Final   Glucose reference range applies only to samples taken after fasting for at least 8 hours.   BUN 09/06/2022 16  6 - 20 mg/dL Final   Creatinine, Ser 09/06/2022 0.72  0.44 - 1.00 mg/dL Final   Calcium 42/59/5638 9.0  8.9 - 10.3 mg/dL Final   Total Protein 75/64/3329 8.3 (H)  6.5 - 8.1 g/dL Final  Albumin 09/06/2022 4.2  3.5 - 5.0 g/dL Final   AST 96/12/5407 150 (H)  15 - 41 U/L Final   ALT 09/06/2022 110 (H)  0 - 44 U/L Final   Alkaline Phosphatase 09/06/2022 58  38 - 126 U/L Final   Total Bilirubin 09/06/2022 0.5  0.3 - 1.2 mg/dL Final   GFR, Estimated 09/06/2022 >60  >60 mL/min Final   Comment: (NOTE) Calculated using the CKD-EPI Creatinine Equation (2021)    Anion gap 09/06/2022 11  5 - 15 Final   Performed at [facility], [address] [place], [place] [address]   WBC 09/06/2022 7.5  4.0 - 10.5 K/uL Final   RBC 09/06/2022 3.61 (L)  3.87 - 5.11 MIL/uL Final   Hemoglobin 09/06/2022 14.0  12.0 - 15.0 g/dL Final   HCT 47/82/9562 40.2  36.0 - 46.0 % Final   MCV 09/06/2022 111.4 (H)  80.0 - 100.0 fL Final   MCH 09/06/2022 38.8 (H)  26.0 - 34.0 pg Final   MCHC 09/06/2022 34.8  30.0 - 36.0 g/dL Final   RDW 13/05/6577 12.9  11.5 - 15.5 % Final   Platelets 09/06/2022 253  150 - 400 K/uL Final   nRBC  09/06/2022 0.0  0.0 - 0.2 % Final   Performed at [facility], [address], [place], [place] [address]   Color, Urine 09/06/2022 YELLOW  YELLOW Final   APPearance 09/06/2022 HAZY (A)  CLEAR Final   Specific Gravity, Urine 09/06/2022 1.020  1.005 - 1.030 Final   pH 09/06/2022 7.0  5.0 - 8.0 Final   Glucose, UA 09/06/2022 NEGATIVE  NEGATIVE mg/dL Final   Hgb urine dipstick 09/06/2022 NEGATIVE  NEGATIVE Final   Bilirubin Urine 09/06/2022 NEGATIVE  NEGATIVE Final   Ketones, ur 09/06/2022 NEGATIVE  NEGATIVE mg/dL Final   Protein, ur 95/28/4132 >=300 (A)  NEGATIVE mg/dL Final   Nitrite 44/10/270 NEGATIVE  NEGATIVE Final   Leukocytes,Ua 09/06/2022 SMALL (A)  NEGATIVE Final   WBC, UA 09/06/2022 >50 (H)  0 - 5 WBC/hpf Final   Bacteria, UA 09/06/2022 RARE (A)  NONE SEEN Final   Squamous Epithelial / HPF 09/06/2022 11-20  0 - 5 Final   Mucus 09/06/2022 PRESENT   Final   Performed at [facility], [address], [place], [place] [address]  Admission on 07/26/2022, Discharged on 07/26/2022  Component Date Value Ref Range Status   WBC 07/26/2022 8.6  4.0 - 10.5 K/uL Final   RBC 07/26/2022 3.88  3.87 - 5.11 MIL/uL Final   Hemoglobin 07/26/2022 14.9  12.0 - 15.0 g/dL Final   HCT 40/34/7425 40.9  36.0 - 46.0 % Final   MCV 07/26/2022 105.4 (H)  80.0 - 100.0 fL Final   MCH 07/26/2022 38.4 (H)  26.0 - 34.0 pg Final   MCHC 07/26/2022 36.4 (H)  30.0 - 36.0 g/dL Final   RDW 95/63/8756 12.4  11.5 - 15.5 % Final   Platelets 07/26/2022 165  150 - 400 K/uL Final   nRBC 07/26/2022 0.0  0.0 - 0.2 % Final   Neutrophils Relative % 07/26/2022 83  % Final   Neutro Abs 07/26/2022 7.2  1.7 - 7.7 K/uL Final   Lymphocytes Relative 07/26/2022 11  % Final   Lymphs Abs 07/26/2022 0.9  0.7 - 4.0 K/uL Final   Monocytes Relative 07/26/2022 5  % Final   Monocytes Absolute 07/26/2022 0.4  0.1 - 1.0 K/uL Final   Eosinophils Relative 07/26/2022 0  % Final   Eosinophils Absolute 07/26/2022  0.0  0.0 - 0.5 K/uL Final    Basophils Relative 07/26/2022 1  % Final   Basophils Absolute 07/26/2022 0.0  0.0 - 0.1 K/uL Final   Immature Granulocytes 07/26/2022 0  % Final   Abs Immature Granulocytes 07/26/2022 0.03  0.00 - 0.07 K/uL Final   Performed at [facility], [address], [place], [place] [address]   Sodium 07/26/2022 136  135 - 145 mmol/L Final   Potassium 07/26/2022 2.8 (L)  3.5 - 5.1 mmol/L Final   Chloride 07/26/2022 95 (L)  98 - 111 mmol/L Final   CO2 07/26/2022 28  22 - 32 mmol/L Final   Glucose, Bld 07/26/2022 120 (H)  70 - 99 mg/dL Final   Glucose reference range applies only to samples taken after fasting for at least 8 hours.   BUN 07/26/2022 23 (H)  6 - 20 mg/dL Final   Creatinine, Ser 07/26/2022 0.67  0.44 - 1.00 mg/dL Final   Calcium 60/45/4098 9.7  8.9 - 10.3 mg/dL Final   Total Protein 11/91/4782 9.1 (H)  6.5 - 8.1 g/dL Final   Albumin 95/62/1308 4.8  3.5 - 5.0 g/dL Final   AST 65/78/4696 191 (H)  15 - 41 U/L Final   ALT 07/26/2022 132 (H)  0 - 44 U/L Final   Alkaline Phosphatase 07/26/2022 73  38 - 126 U/L Final   Total Bilirubin 07/26/2022 1.2  0.3 - 1.2 mg/dL Final   GFR, Estimated 07/26/2022 >60  >60 mL/min Final   Comment: (NOTE) Calculated using the CKD-EPI Creatinine Equation (2021)    Anion gap 07/26/2022 13  5 - 15 Final   Performed at [facility], [address], [place], [place] [address]   SARS Coronavirus 2 by RT PCR 07/26/2022 NEGATIVE  NEGATIVE Final   Comment: (NOTE) SARS-CoV-2 target nucleic acids are NOT DETECTED.  The SARS-CoV-2 RNA is generally detectable in upper respiratory specimens during the acute phase of infection. The lowest concentration of SARS-CoV-2 viral copies this assay can detect is 138 copies/mL. A negative result does not preclude SARS-Cov-2 infection and should not be used as the sole basis for treatment or other patient management decisions. A negative result may occur with  improper specimen collection/handling, submission of specimen  other than nasopharyngeal swab, presence of viral mutation(s) within the areas targeted by this assay, and inadequate number of viral copies(<138 copies/mL). A negative result must be combined with clinical observations, patient history, and epidemiological information. The expected result is Negative.  Fact Sheet for Patients:  BloggerCourse.com  Fact Sheet for Healthcare Providers:  SeriousBroker.it  This test is no                          t yet approved or cleared by the [place] FDA and  has been authorized for detection and/or diagnosis of SARS-CoV-2 by FDA under an Emergency Use Authorization (EUA). This EUA will remain  in effect (meaning this test can be used) for the duration of the COVID-19 declaration under Section 564(b)(1) of the Act, 21 U.S.C.section 360bbb-3(b)(1), unless the authorization is terminated  or revoked sooner.       Influenza A by PCR 07/26/2022 NEGATIVE  NEGATIVE Final   Influenza B by PCR 07/26/2022 NEGATIVE  NEGATIVE Final   Comment: (NOTE) The Xpert Xpress SARS-CoV-2/FLU/RSV plus assay is intended as an aid in the diagnosis of influenza from Nasopharyngeal swab specimens and should not be used as a sole basis for treatment.  Nasal washings and aspirates are unacceptable for Xpert Xpress SARS-CoV-2/FLU/RSV testing.  Fact Sheet for Patients: BloggerCourse.com  Fact Sheet for Healthcare Providers: SeriousBroker.it  This test is not yet approved or cleared by the [place] FDA and has been authorized for detection and/or diagnosis of SARS-CoV-2 by FDA under an Emergency Use Authorization (EUA). This EUA will remain in effect (meaning this test can be used) for the duration of the COVID-19 declaration under Section 564(b)(1) of the Act, 21 U.S.C. section 360bbb-3(b)(1), unless the authorization is terminated or revoked.  Performed at  [facility], [address] [place], [place] [address]    Troponin I (High Sensitivity) 07/26/2022 12  <18 ng/L Final   Comment: (NOTE) Elevated high sensitivity troponin I (hsTnI) values and significant  changes across serial measurements may suggest ACS but many other  chronic and acute conditions are known to elevate hsTnI results.  Refer to the "Links" section for chest pain algorithms and additional  guidance. Performed at [facility], [address], [place], [place] [address]    Troponin I (High Sensitivity) 07/26/2022 13  <18 ng/L Final   Comment: (NOTE) Elevated high sensitivity troponin I (hsTnI) values and significant  changes across serial measurements may suggest ACS but many other  chronic and acute conditions are known to elevate hsTnI results.  Refer to the "Links" section for chest pain algorithms and additional  guidance. Performed at [facility], [address], [place], [place] [address]    Lipase 07/26/2022 220 (H)  11 - 51 U/L Final   Performed at [facility], [address] [place], [place] [address]    Blood Alcohol level:  Lab Results  Component Value Date   [facility] <10 01/01/2023    Metabolic Disorder Labs: Lab Results  Component Value Date   HGBA1C 5.5 01/01/2023   MPG 111 01/01/2023   No results found for: "PROLACTIN" Lab Results  Component Value Date   CHOL 90 12/19/2022   TRIG 53 12/19/2022   HDL 39 (L) 12/19/2022   CHOLHDL 2.3 12/19/2022   VLDL 11 12/19/2022   LDLCALC 40 12/19/2022    Therapeutic Lab Levels: No results found for: "LITHIUM" No results found for: "VALPROATE" No results found for: "CBMZ"  Physical Findings   PHQ2-9    Flowsheet Row ED from 01/01/2023 in [facility] ED from 10/04/2020 in [facility]  PHQ-2 Total Score 1 6  PHQ-9 Total Score 9 21      Flowsheet Row ED from 01/01/2023 in [facility] ED to Hosp-Admission (Discharged)  from 12/18/2022 in [facility] ED from 10/09/2022 in [facility] Emergency Department at [facility]  C-SSRS RISK CATEGORY No Risk No Risk No Risk        Musculoskeletal  Strength & Muscle Tone: within normal limits Gait & Station: normal Patient leans: N/A  Psychiatric Specialty Exam  General Appearance: appears at stated age, casually dressed  Behavior: pleasant and cooperative   Psychomotor Activity: no psychomotor agitation or retardation noted   Eye Contact: fair  Speech: normal amount, tone, volume and fluency    Mood: Euthymic Affect: Congruent  Thought Process: linear, goal directed, no circumstantial or tangential thought process noted, no racing thoughts or flight of ideas  Descriptions of Associations: intact   Thought Content Hallucinations: denies AH, VH , does not appear responding to stimuli  Delusions: no paranoia, delusions of control, grandeur, ideas of reference, thought broadcasting, and magical  thinking  Suicidal Thoughts: denies SI, intention, plan  Homicidal Thoughts: denies HI, intention, plan   Alertness/Orientation: alert and fully oriented   Insight: fair Judgment: fair  Memory: intact   Executive Functions  Concentration: intact  Attention Span: fair  Recall: intact  Fund of Knowledge: fair   Psychomotor Activity  Psychomotor Activity: Normal  Assets  Assets: Manufacturing systems engineer; Desire for Improvement; Social Support  Sleep  Sleep: Good  No data recorded  Physical Exam   Review of Systems  Constitutional: Negative.  Negative for chills, fever and weight loss.  HENT: Negative.    Eyes: Negative.   Respiratory: Negative.    Cardiovascular: Negative.   Gastrointestinal:  Negative for diarrhea, nausea and vomiting.  Genitourinary: Negative.   Musculoskeletal: Negative Skin: Negative.   Neurological: Negative.  Negative for tingling.   Blood pressure 101/72, pulse 69, temperature 97.9 F  (36.6 C), temperature source Tympanic, resp. rate 16, last menstrual period 01/05/2021, SpO2 98 %. There is no height or weight on file to calculate BMI.  Treatment Plan Summary: Daily contact with patient to assess and evaluate symptoms and progress in treatment Continue PRN's: Tylenol, Maalox, Atarax, Milk of Magnesia, Trazodone, Zofran, Imodium, Miralax  AUD - Continue  po thiamine and MVI replacement  NRT - Continue Nicotine patch 14 mg/24 hr  Start Nicotine gum 2 mg PRN Q4H  Constipation Better after Dulcolax - Continue Senna and Miralax.   Dispo - Pending confirmation of bed at Laurena Spies, MD 01/06/2023, 3:07 PM

## 2023-01-07 DIAGNOSIS — F1010 Alcohol abuse, uncomplicated: Secondary | ICD-10-CM | POA: Diagnosis not present

## 2023-01-07 DIAGNOSIS — Z8719 Personal history of other diseases of the digestive system: Secondary | ICD-10-CM | POA: Diagnosis not present

## 2023-01-07 DIAGNOSIS — F419 Anxiety disorder, unspecified: Secondary | ICD-10-CM | POA: Diagnosis not present

## 2023-01-07 DIAGNOSIS — Z1152 Encounter for screening for COVID-19: Secondary | ICD-10-CM | POA: Diagnosis not present

## 2023-01-07 LAB — HEPATIC FUNCTION PANEL
ALT: 57 U/L — ABNORMAL HIGH (ref 0–44)
AST: 55 U/L — ABNORMAL HIGH (ref 15–41)
Albumin: 4.2 g/dL (ref 3.5–5.0)
Alkaline Phosphatase: 45 U/L (ref 38–126)
Bilirubin, Direct: <0.1 mg/dL (ref 0.0–0.2)
Total Bilirubin: 0.1 mg/dL — ABNORMAL LOW (ref 0.3–1.2)
Total Protein: 8.1 g/dL (ref 6.5–8.1)

## 2023-01-07 MED ORDER — HYDROXYZINE HCL 25 MG PO TABS
25.0000 mg | ORAL_TABLET | Freq: Three times a day (TID) | ORAL | Status: DC | PRN
  Administered 2023-01-07 (×2): 25 mg via ORAL
  Filled 2023-01-07 (×2): qty 1

## 2023-01-07 MED ORDER — FLUOXETINE HCL 20 MG PO CAPS
20.0000 mg | ORAL_CAPSULE | Freq: Every day | ORAL | Status: DC
  Administered 2023-01-07 – 2023-01-08 (×2): 20 mg via ORAL
  Filled 2023-01-07 (×2): qty 1
  Filled 2023-01-07: qty 14

## 2023-01-07 MED ORDER — NICOTINE POLACRILEX 2 MG MT GUM
2.0000 mg | CHEWING_GUM | OROMUCOSAL | Status: DC | PRN
  Administered 2023-01-07 – 2023-01-08 (×3): 2 mg via ORAL
  Administered 2023-01-08: 4 mg via ORAL
  Administered 2023-01-08: 2 mg via ORAL
  Filled 2023-01-07 (×4): qty 1
  Filled 2023-01-07: qty 2
  Filled 2023-01-07: qty 1

## 2023-01-07 NOTE — ED Notes (Signed)
Pt is in the bed sleeping. Respirations are even and unlabored. No acute distress noted. Will continue to monitor for safety. 

## 2023-01-07 NOTE — ED Notes (Signed)
Pt sitting in dayroom interacting with peers. No acute distress noted. No concerns voiced. Informed pt to notify staff with any needs or assistance. Pt verbalized understanding or agreement. Will continue to monitor for safety. 

## 2023-01-07 NOTE — ED Provider Notes (Signed)
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 [address]   Color, Urine 10/09/2022 AMBER (A)  YELLOW Final   BIOCHEMICALS MAY BE AFFECTED BY COLOR   APPearance 10/09/2022 CLOUDY (A)  CLEAR Final   Specific Gravity, Urine 10/09/2022 1.020  1.005 - 1.030 Final   pH 10/09/2022 6.0  5.0 - 8.0 Final   Glucose, UA 10/09/2022 NEGATIVE  NEGATIVE mg/dL Final   Hgb urine dipstick 10/09/2022 SMALL (A)  NEGATIVE Final   Bilirubin Urine 10/09/2022 NEGATIVE  NEGATIVE Final   Ketones, ur 10/09/2022 80 (A)  NEGATIVE mg/dL Final   Protein, ur 44/11/4740  >=300 (A)  NEGATIVE mg/dL Final   Nitrite 59/56/3875 NEGATIVE  NEGATIVE Final   Leukocytes,Ua 10/09/2022 SMALL (A)  NEGATIVE Final   RBC / HPF 10/09/2022 6-10  0 - 5 RBC/hpf Final   WBC, UA 10/09/2022 21-50  0 - 5 WBC/hpf Final   Bacteria, UA 10/09/2022 MANY (A)  NONE SEEN Final   Squamous Epithelial / HPF 10/09/2022 0-5  0 - 5 /HPF Final   Mucus 10/09/2022 PRESENT   Final   Performed at [facility], [address], [place], [place] [address]   Magnesium 10/09/2022 1.6 (L)  1.7 - 2.4 mg/dL Final   Performed at [facility], [address] [place], [place] [address]  Admission on 09/06/2022, Discharged on 09/06/2022  Component Date Value Ref Range Status   Lipase 09/06/2022 37  11 - 51 U/L Final   Performed at [facility], [address], [place], [place] [address]   Sodium 09/06/2022 143  135 - 145 mmol/L Final   Potassium 09/06/2022 3.9  3.5 - 5.1 mmol/L Final   Chloride 09/06/2022 105  98 - 111 mmol/L Final   CO2 09/06/2022 27  22 - 32 mmol/L Final   Glucose, Bld 09/06/2022 125 (H)  70 - 99 mg/dL Final   Glucose reference range applies only to samples taken after fasting for at least 8 hours.   BUN 09/06/2022 16  6 - 20 mg/dL Final   Creatinine, Ser 09/06/2022 0.72  0.44 - 1.00 mg/dL Final   Calcium 16/07/9603 9.0  8.9 - 10.3 mg/dL Final   Total Protein 54/06/8118 8.3 (H)  6.5 - 8.1 g/dL Final   Albumin 14/78/2956 4.2  3.5 - 5.0 g/dL Final   AST 21/30/8657 150 (H)  15 - 41 U/L Final   ALT 09/06/2022 110 (H)  0 - 44 U/L Final   Alkaline Phosphatase 09/06/2022 58  38 - 126 U/L Final   Total Bilirubin 09/06/2022 0.5  0.3 - 1.2 mg/dL Final   GFR, Estimated 09/06/2022 >60  >60 mL/min Final   Comment: (NOTE) Calculated using the CKD-EPI Creatinine Equation (2021)    Anion gap 09/06/2022 11  5 - 15 Final   Performed at [facility], [address], [place], [place] [address]   WBC 09/06/2022 7.5  4.0 - 10.5 K/uL Final   RBC 09/06/2022 3.61 (L)   3.87 - 5.11 MIL/uL Final   Hemoglobin 09/06/2022 14.0  12.0 - 15.0 g/dL Final   HCT 29/52/8413 40.2  36.0 - 46.0 % Final   MCV 09/06/2022 111.4 (H)  80.0 - 100.0 fL Final   MCH 09/06/2022 38.8 (H)  26.0 - 34.0 pg Final   MCHC 09/06/2022 34.8  30.0 - 36.0 g/dL Final   RDW 24/40/1027 12.9  11.5 - 15.5 % Final   Platelets 09/06/2022 253  150 - 400 K/uL Final   nRBC 09/06/2022 0.0  0.0 - 0.2 % Final   Performed at [facility], [address] [place], [place] [address]   Color, Urine 09/06/2022 YELLOW  YELLOW Final   APPearance 09/06/2022 HAZY (A)  CLEAR Final   Specific Gravity, Urine 09/06/2022 1.020  1.005 - 1.030 Final   pH 09/06/2022 7.0  5.0 - 8.0 Final   Glucose, UA 09/06/2022 NEGATIVE  NEGATIVE mg/dL Final   Hgb urine dipstick 09/06/2022 NEGATIVE  NEGATIVE Final   Bilirubin Urine 09/06/2022 NEGATIVE  NEGATIVE Final   Ketones, ur 09/06/2022 NEGATIVE  NEGATIVE mg/dL Final   Protein, ur 44/11/4740 >=300 (A)  NEGATIVE mg/dL Final   Nitrite 59/56/3875 NEGATIVE  NEGATIVE Final   Leukocytes,Ua 09/06/2022 SMALL (A)  NEGATIVE Final   WBC, UA 09/06/2022 >50 (H)  0 - 5 WBC/hpf Final   Bacteria, UA 09/06/2022 RARE (A)  NONE SEEN Final   Squamous Epithelial / HPF 09/06/2022 11-20  0 - 5 Final   Mucus 09/06/2022 PRESENT   Final   Performed at [facility], [address] [place], [place] [address]  Admission on 07/26/2022, Discharged on 07/26/2022  Component Date Value Ref Range Status   WBC 07/26/2022 8.6  4.0 - 10.5 K/uL Final   RBC 07/26/2022 3.88  3.87 - 5.11 MIL/uL Final   Hemoglobin 07/26/2022 14.9  12.0 - 15.0 g/dL Final   HCT 95/18/8416 40.9  36.0 - 46.0 % Final   MCV 07/26/2022 105.4 (H)  80.0 - 100.0 fL Final   MCH 07/26/2022 38.4 (H)  26.0 - 34.0 pg Final   MCHC 07/26/2022 36.4 (H)  30.0 - 36.0 g/dL Final   RDW 60/63/0160 12.4  11.5 - 15.5 % Final   Platelets 07/26/2022 165  150 - 400 K/uL Final   nRBC 07/26/2022 0.0  0.0 - 0.2 % Final   Neutrophils Relative % 07/26/2022 83  %  Final   Neutro Abs 07/26/2022 7.2  1.7 - 7.7 K/uL Final   Lymphocytes Relative 07/26/2022 11  % Final   Lymphs Abs 07/26/2022 0.9  0.7 - 4.0 K/uL Final   Monocytes Relative 07/26/2022 5  % Final   Monocytes Absolute 07/26/2022 0.4  0.1 - 1.0 K/uL Final   Eosinophils Relative 07/26/2022 0  % Final   Eosinophils Absolute 07/26/2022 0.0  0.0 - 0.5 K/uL Final   Basophils Relative 07/26/2022 1  % Final   Basophils Absolute 07/26/2022 0.0  0.0 - 0.1 K/uL Final   Immature Granulocytes 07/26/2022 0  % Final   Abs Immature Granulocytes 07/26/2022 0.03  0.00 - 0.07 K/uL Final   Performed at [facility], [address] [place], [place] [address]   Sodium 07/26/2022 136  135 - 145 mmol/L Final   Potassium 07/26/2022 2.8 (L)  3.5 - 5.1 mmol/L Final   Chloride 07/26/2022 95 (L)  98 - 111 mmol/L Final   CO2 07/26/2022 28  22 - 32 mmol/L Final   Glucose, Bld 07/26/2022 120 (H)  70 - 99 mg/dL Final   Glucose reference range applies only to samples taken after fasting for at least 8 hours.   BUN 07/26/2022 23 (H)  6 - 20 mg/dL Final   Creatinine, Ser 07/26/2022 0.67  0.44 - 1.00 mg/dL Final   Calcium 60/45/4098 9.7  8.9 - 10.3 mg/dL Final   Total Protein 11/91/4782 9.1 (H)  6.5 - 8.1 g/dL Final   Albumin 95/62/1308 4.8  3.5 - 5.0 g/dL Final   AST 65/78/4696 191 (H)  15 - 41 U/L Final   ALT 07/26/2022 132 (H)  0 - 44 U/L Final   Alkaline Phosphatase 07/26/2022 73  38 - 126 U/L Final   Total Bilirubin 07/26/2022 1.2  0.3 - 1.2 mg/dL Final   GFR, Estimated 07/26/2022 >60  >60 mL/min Final   Comment: (NOTE) Calculated using the CKD-EPI Creatinine Equation (2021)    Anion gap 07/26/2022 13  5 - 15 Final   Performed at [facility], [address] [place], [place] [address]   SARS Coronavirus 2 by RT PCR 07/26/2022 NEGATIVE  NEGATIVE Final   Comment: (NOTE) SARS-CoV-2 target nucleic acids are NOT DETECTED.  The SARS-CoV-2 RNA is generally detectable in upper respiratory specimens during the acute  phase of infection. The lowest concentration of SARS-CoV-2 viral copies this assay can detect is 138 copies/mL. A negative result does not preclude SARS-Cov-2 infection and should not be used as the sole basis for treatment or other patient management decisions. A negative result may occur with  improper specimen collection/handling, submission of specimen other than nasopharyngeal swab, presence of viral mutation(s) within the areas targeted by this assay, and inadequate number of viral copies(<138 copies/mL). A negative result must be combined with clinical observations, patient history, and epidemiological information. The expected result is Negative.  Fact Sheet for Patients:  BloggerCourse.com  Fact Sheet for Healthcare Providers:  SeriousBroker.it  This test is no                          t yet approved or cleared by the [place] FDA and  has been authorized for detection and/or diagnosis of SARS-CoV-2 by FDA under an Emergency Use Authorization (EUA). This EUA will remain  in effect (meaning this test can be used) for the duration of the COVID-19 declaration under Section 564(b)(1) of the Act, 21 U.S.C.section 360bbb-3(b)(1), unless the  authorization is terminated  or revoked sooner.       Influenza A by PCR 07/26/2022 NEGATIVE  NEGATIVE Final   Influenza B by PCR 07/26/2022 NEGATIVE  NEGATIVE Final   Comment: (NOTE) The Xpert Xpress SARS-CoV-2/FLU/RSV plus assay is intended as an aid in the diagnosis of influenza from Nasopharyngeal swab specimens and should not be used as a sole basis for treatment. Nasal washings and aspirates are unacceptable for Xpert Xpress SARS-CoV-2/FLU/RSV testing.  Fact Sheet for Patients: BloggerCourse.com  Fact Sheet for Healthcare Providers: SeriousBroker.it  This test is not yet approved or cleared by the [place] FDA and has  been authorized for detection and/or diagnosis of SARS-CoV-2 by FDA under an Emergency Use Authorization (EUA). This EUA will remain in effect (meaning this test can be used) for the duration of the COVID-19 declaration under Section 564(b)(1) of the Act, 21 U.S.C. section 360bbb-3(b)(1), unless the authorization is terminated or revoked.  Performed at [facility], [address], [place], [place] [address]    Troponin I (High Sensitivity) 07/26/2022 12  <18 ng/L Final   Comment: (NOTE) Elevated high sensitivity troponin I (hsTnI) values and significant  changes across serial measurements may suggest ACS but many other  chronic and acute conditions are known to elevate hsTnI results.  Refer to the "Links" section for chest pain algorithms and additional  guidance. Performed at [facility], [address], [place], [place] [address]    Troponin I (High Sensitivity) 07/26/2022 13  <18 ng/L Final   Comment: (NOTE) Elevated high sensitivity troponin I (hsTnI) values and significant  changes across serial measurements may suggest ACS but many other  chronic and acute conditions are known to elevate hsTnI results.  Refer to the "Links" section for chest pain algorithms and additional  guidance. Performed at [facility], [address], [place], [place] [address]    Lipase 07/26/2022 220 (H)  11 - 51 U/L Final   Performed at [facility], [address], [place], [place] [address]    Blood Alcohol level:  Lab Results  Component Value Date   [facility] <10 01/01/2023    Metabolic Disorder Labs: Lab Results  Component Value Date   HGBA1C 5.5 01/01/2023   MPG 111 01/01/2023   No results found for: "PROLACTIN" Lab Results  Component Value Date   CHOL 90 12/19/2022   TRIG 53 12/19/2022   HDL 39 (L) 12/19/2022   CHOLHDL 2.3 12/19/2022   VLDL 11 12/19/2022   LDLCALC 40 12/19/2022    Therapeutic Lab Levels: No results found for: "LITHIUM" No results found for: "VALPROATE" No results  found for: "CBMZ"  Physical Findings   PHQ2-9    Flowsheet Row ED from 01/01/2023 in [facility] ED from 10/04/2020 in [facility]  PHQ-2 Total Score 1 6  PHQ-9 Total Score 9 21      Flowsheet Row ED from 01/01/2023 in [facility] ED to Hosp-Admission (Discharged) from 12/18/2022 in [facility] ED from 10/09/2022 in [facility] Emergency Department at [facility]  C-SSRS RISK CATEGORY No Risk No Risk No Risk        Musculoskeletal  Strength & Muscle Tone: within normal limits Gait & Station: normal Patient leans: N/A  Psychiatric Specialty Exam  General Appearance: appears at stated age, casually dressed  Behavior: pleasant and cooperative   Psychomotor Activity: no psychomotor agitation or retardation noted   Eye Contact: good  Speech:  normal amount, tone, volume and fluency    Mood: Depressed Affect: Congruent, tearful  Thought Process: linear, goal directed, no circumstantial or tangential thought process noted, no racing thoughts or flight of ideas  Descriptions of Associations: intact   Thought Content Hallucinations: denies AH, VH , does not appear responding to stimuli  Delusions: no paranoia, delusions of control, grandeur, ideas of reference, thought broadcasting, and magical thinking  Suicidal Thoughts: denies SI, intention, plan  Homicidal Thoughts: denies HI, intention, plan   Alertness/Orientation: alert and fully oriented   Insight: fair Judgment: fair  Memory: intact   Executive Functions  Concentration: intact  Attention Span: fair  Recall: intact  Fund of Knowledge: fair   Psychomotor Activity  Psychomotor Activity: Normal  Assets  Assets: Manufacturing systems engineerCommunication Skills; Desire for Improvement; Social Support  Sleep  Sleep: Good   Physical Exam   Review of Systems  Constitutional: Negative.  Negative for chills, fever and weight  loss.  HENT: Negative.    Eyes: Negative.   Respiratory: Negative.    Cardiovascular: Negative.   Gastrointestinal:  Negative for diarrhea, nausea and vomiting.  Genitourinary: Negative.   Musculoskeletal: Negative Skin: Negative.   Neurological: Negative.  Negative for tingling.   Blood pressure (!) 125/90, pulse 75, temperature 97.8 F (36.6 C), temperature source Oral, resp. rate 18, last menstrual period 01/05/2021, SpO2 96 %. There is no height or weight on file to calculate BMI.  Treatment Plan Summary: Daily contact with patient to assess and evaluate symptoms and progress in treatment Continue PRN's: Tylenol, Maalox, Atarax, Milk of Magnesia, Trazodone, Zofran, Imodium, Miralax  AUD c/b alcohol-induced depressive disorder - Continue  po thiamine and MVI replacement - START Prozac 20 mg daily.  NRT - Continue Nicotine patch 14 mg/24 hr  -Continue Nicotine gum 2 mg PRN Q4H  Constipation Better after Dulcolax - Continue Senna and Miralax.   Dispo - [facility]  Jesly Hartmann, MD 01/07/2023, 3:20 PM

## 2023-01-07 NOTE — ED Notes (Signed)
Pt is currently sleeping, no distress noted, environmental check complete, will continue to monitor patient for safety.  

## 2023-01-07 NOTE — ED Notes (Signed)
Pt is in the Dayroom watching TV. Respirations are even and unlabored. No acute distress noted. Will continue to monitor for safety.  

## 2023-01-07 NOTE — ED Notes (Addendum)
Patient A&Ox4. Denies intent to harm self/others when asked. Denies A/VH. Patient denies any physical complaints when asked. Pt states, "I don't know how much longer I can stay here because [facility] may not take me until next week. But I'm scared to go back home because I'm not strong enough just yet. I may drink again (tearful)". Pt request to restart her Prozac for depression. MD made aware. Support and encouragement provided. Re-iterated importance of utilizing effective coping skills to resist cravings. Pt verbalized understanding. Routine safety checks conducted according to facility protocol. Encouraged patient to notify staff if thoughts of harm toward self or others arise. Patient verbalize understanding and agreement. Will continue to monitor for safety.

## 2023-01-07 NOTE — Discharge Planning (Signed)
LCSW attempted to follow up with [facility] regarding patient referral. LCSW spoke with Admissions Coordinator Lupita Leash who reports she will follow up with [name] to see if referral has been reviewed. She will then follow up with updates as received.   LCSW spoke with patient to provide update. Patient understands that we are currently still awaiting decision from [facility]. Patient reports she hope she gets accepted as she knows if she returns back into the same living situation, she is going to starting drinking again. LCSW provided brief counseling to the patient and patient was receptive to the feedback provided.   LCSW will provide updates as received.   Kaitlin Boyden, LCSW Clinical Social Worker [facility] Ph: [phone]

## 2023-01-08 DIAGNOSIS — Z8719 Personal history of other diseases of the digestive system: Secondary | ICD-10-CM | POA: Diagnosis not present

## 2023-01-08 DIAGNOSIS — F1010 Alcohol abuse, uncomplicated: Secondary | ICD-10-CM | POA: Diagnosis not present

## 2023-01-08 DIAGNOSIS — Z1152 Encounter for screening for COVID-19: Secondary | ICD-10-CM | POA: Diagnosis not present

## 2023-01-08 DIAGNOSIS — F419 Anxiety disorder, unspecified: Secondary | ICD-10-CM | POA: Diagnosis not present

## 2023-01-08 MED ORDER — HYDROXYZINE HCL 25 MG PO TABS
25.0000 mg | ORAL_TABLET | Freq: Every evening | ORAL | Status: DC | PRN
  Filled 2023-01-08: qty 14

## 2023-01-08 NOTE — Group Note (Signed)
Group Topic: Understanding Self  Group Date: 01/08/2023 Start Time: 1100 End Time: 1115 Facilitators: Vonzell Schlatter B  Department: [facility]  Number of Participants: 7  Group Focus: self-awareness and social skills Treatment Modality:  Psychoeducation Interventions utilized were story telling, group exercise Purpose: express feelings and increase insight  Name: Kaitlin Carpenter Date of Birth: [birth year]  MR: [id number]    Level of Participation: active Quality of Participation: attentive and cooperative Interactions with others: gave feedback Mood/Affect: positive Triggers (if applicable): na Cognition: coherent/clear Progress: Moderate Response: na Plan: follow-up needed  Patients Problems:  Patient Active Problem List   Diagnosis Date Noted   Acute pancreatitis 12/19/2022   Acute alcoholic pancreatitis 12/19/2022   Hyponatremia 12/19/2022   Hypokalemia 12/19/2022   Tobacco abuse 12/19/2022   Melena 12/19/2022   Alcohol abuse 10/04/2020

## 2023-01-08 NOTE — ED Notes (Signed)
Patient requested nicotine gum . Gum was given. 

## 2023-01-08 NOTE — Discharge Planning (Signed)
Referral was received and per Kiera, patient has been accepted and can transfer to the facility on tomorrow by 9:00am. Update has been provided to the patient and MD made aware. Patient will need a 14-30 day supply of medication and one month refill. No nicotine gum allowed, however 14-30 day nicotine patches to be provided if needed. No other needs to report at this time.    LCSW will continue to follow up and provide updates as received.    Dawnita Molner, LCSW Clinical Social Worker [place] Ph: [phone]  

## 2023-01-08 NOTE — ED Notes (Signed)
Patient alert and oriented x 3. Denies SI/HI/AVH. Denies intent or plan to harm self or others. Routine conducted according to faculty protocol. Encourage patient to notify staff with any needs or concerns. Patient verbalized agreement and understanding. Will continue to monitor for safety. 

## 2023-01-08 NOTE — ED Notes (Signed)
Pt is in the dayroom watching TV with peers. Pt stated worried about her liver enzymes test that was done yesterday. Pt denies SI/HI/AVH. No acute distress noted. Will continue to monitor for safety.

## 2023-01-08 NOTE — ED Notes (Signed)
Pt is in the bed sleeping. Respirations are even and unlabored. No acute distress noted. Will continue to monitor for safety. 

## 2023-01-08 NOTE — ED Notes (Signed)
Pt refused AA and group 

## 2023-01-08 NOTE — ED Provider Notes (Signed)
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    Subjective: ***  Stay Summary: ***  Total Time spent with patient: {Time; 15 min - 8 hours:17441}  Past Psychiatric History: *** Past Medical History: *** Family History: *** Family Psychiatric History: *** Social History: *** Tobacco Cessation:  {Discharge tobacco cessation prescription:304700209}  Current Medications:  Current Facility-Administered Medications  Medication Dose Route Frequency Provider Last Rate Last Admin   acetaminophen (TYLENOL) tablet 650 mg  650 mg Oral Q6H PRN Kizzie Ide B, MD   650 mg at 01/08/23 0908   alum & mag hydroxide-simeth (MAALOX/MYLANTA) 200-200-20 MG/5ML suspension 30 mL  30 mL Oral Q4H PRN Kizzie Ide B, MD       FLUoxetine (PROZAC) capsule 20 mg  20 mg Oral Daily Lamar Sprinkles, MD   20 mg at 01/08/23 8850   hydrOXYzine (ATARAX) tablet 25 mg  25 mg Oral QHS PRN Lamar Sprinkles, MD       multivitamin with minerals tablet 1 tablet  1 tablet Oral Daily Kizzie Ide B, MD   1 tablet at 01/08/23 2774   nicotine (NICODERM CQ - dosed in mg/24 hours) patch 21 mg  21 mg Transdermal Daily Bobbitt, Shalon E, NP   21 mg at 01/08/23 0914   nicotine polacrilex (NICORETTE) gum 2-4 mg  2-4 mg Oral PRN Lamar Sprinkles, MD   4 mg at 01/08/23 0954   polyethylene glycol (MIRALAX / GLYCOLAX) packet 17 g  17 g Oral Daily Lamar Sprinkles, MD   17 g at 01/08/23 1287   senna (SENOKOT) tablet 17.2 mg  2 tablet Oral Daily Lamar Sprinkles, MD   17.2 mg at 01/08/23 8676   thiamine (VITAMIN B1) tablet 100 mg  100 mg Oral Daily Kizzie Ide B, MD   100 mg at 01/08/23 7209   traZODone (DESYREL) tablet 100 mg  100 mg Oral QHS PRN Sindy Guadeloupe, NP   100 mg at 01/07/23 2123   Current Outpatient  Medications  Medication Sig Dispense Refill   amLODipine (NORVASC) 5 MG tablet Take 1 tablet (5 mg total) by mouth daily. 30 tablet 1   dimenhyDRINATE (DRAMAMINE) 50 MG tablet Take 50 mg by mouth every 8 (eight) hours as needed for nausea.     Multiple Vitamin (MULTIVITAMIN WITH MINERALS) TABS tablet Take 1 tablet by mouth daily.      PTA Medications:  Facility Ordered Medications  Medication   acetaminophen (TYLENOL) tablet 650 mg   alum & mag hydroxide-simeth (MAALOX/MYLANTA) 200-200-20 MG/5ML suspension 30 mL   [COMPLETED] thiamine (VITAMIN B1) injection 100 mg   thiamine (VITAMIN B1) tablet 100 mg   multivitamin with minerals tablet 1 tablet   [EXPIRED] LORazepam (ATIVAN) tablet 1 mg   [EXPIRED] hydrOXYzine (ATARAX) tablet 25 mg   [EXPIRED] loperamide (IMODIUM) capsule 2-4 mg   [EXPIRED] ondansetron (ZOFRAN-ODT) disintegrating tablet 4 mg   traZODone (DESYREL) tablet 100 mg   [COMPLETED] polyethylene glycol (MIRALAX / GLYCOLAX) packet 17 g   polyethylene glycol (MIRALAX / GLYCOLAX) packet 17 g   senna (SENOKOT) tablet 17.2 mg   [COMPLETED] bisacodyl (DULCOLAX) suppository 10 mg   nicotine (NICODERM CQ - dosed in mg/24 hours) patch 21 mg   nicotine polacrilex (NICORETTE) gum 2-4 mg   FLUoxetine (PROZAC) capsule 20 mg  hydrOXYzine (ATARAX) tablet 25 mg   PTA Medications  Medication Sig   amLODipine (NORVASC) 5 MG tablet Take 1 tablet (5 mg total) by mouth daily.       01/04/2023    1:54 PM 01/03/2023    4:50 PM 10/04/2020    4:47 PM  Depression screen PHQ 2/9  Decreased Interest 0 1 3  Down, Depressed, Hopeless 1 2 3   PHQ - 2 Score 1 3 6   Altered sleeping  2 3  Tired, decreased energy  2 3  Change in appetite  1 3  Feeling bad or failure about yourself   1 3  Trouble concentrating  0 1  Moving slowly or fidgety/restless   2  Suicidal thoughts  0 0  PHQ-9 Score  9 21    Flowsheet Row ED from 01/01/2023 in [facility] ED to Hosp-Admission  (Discharged) from 12/18/2022 in [facility] ED from 10/09/2022 in [facility] Department at [facility]  C-SSRS RISK CATEGORY No Risk No Risk No Risk       Musculoskeletal  Strength & Muscle Tone: {desc; muscle tone:32375} Gait & Station: {PE GAIT ED ZSMO:70786} Patient leans: {Patient Leans:21022755}  Psychiatric Specialty Exam  Presentation  General Appearance:  Appropriate for Environment; Casual  Eye Contact: Good  Speech: Clear and Coherent; Normal Rate  Speech Volume: Normal  Handedness: Right   Mood and Affect  Mood: Euthymic  Affect: Appropriate; Congruent   Thought Process  Thought Processes: Coherent; Linear  Descriptions of Associations:Intact  Orientation:Full (Time, Place and Person)  Thought Content:Logical; WDL  Diagnosis of Schizophrenia or Schizoaffective disorder in past: No    Hallucinations:No data recorded Ideas of Reference:None  Suicidal Thoughts:No data recorded Homicidal Thoughts:No data recorded  Sensorium  Memory: Immediate Good; Recent Good  Judgment: Good  Insight: Good   Executive Functions  Concentration: Good  Attention Span: Good  Recall: Good  Fund of Knowledge: Good  Language: Good   Psychomotor Activity  Psychomotor Activity:No data recorded  Assets  Assets: Communication Skills; Desire for Improvement; Social Support   Sleep  Sleep:No data recorded  No data recorded  Physical Exam  Physical Exam ROS Blood pressure 90/73, pulse 79, temperature 97.8 F (36.6 C), temperature source Oral, resp. rate 16, last menstrual period 01/05/2021, SpO2 96 %. There is no height or weight on file to calculate BMI.  Demographic Factors:  {Demographic Factors:20662}  Loss Factors: {Loss Factors:20659}  Historical Factors: {Historical Factors:20660}  Risk Reduction Factors:   {Risk Reduction Factors:20661}  Continued Clinical Symptoms:  {Clinical  Factors:22706}  Cognitive Features That Contribute To Risk:  {chl bhh Cognitive Features:304700251}    Suicide Risk:  {[facility] SUICIDE LJQG:92010}  Plan Of Care/Follow-up recommendations:  {[facility] DC FU RECOMMENDATIONS:22620}  Disposition: ***  Lamar Sprinkles, MD 01/08/2023, 4:02 PM

## 2023-01-08 NOTE — Group Note (Signed)
Group Topic: Fears and Unhealthy Coping Skills  Group Date: 01/08/2023 Start Time: 1130 End Time: 1215 Facilitators: Merrie Roof, RN  Department: [facility]  Number of Participants: 7  Group Focus: coping skills Treatment Modality:  Patient-Centered Therapy Interventions utilized were group exercise Purpose: express feelings  Name: Kaitlin Carpenter Date of Birth: 1970/12/29  MR: [id number]    Level of Participation: active Quality of Participation: cooperative Interactions with others: gave feedback Mood/Affect: appropriate Triggers (if applicable):  Cognition: coherent/clear Progress: Significant Response:  Plan: patient will be encouraged to continue with therapy.  Patients Problems:  Patient Active Problem List   Diagnosis Date Noted   Acute pancreatitis 12/19/2022   Acute alcoholic pancreatitis 12/19/2022   Hyponatremia 12/19/2022   Hypokalemia 12/19/2022   Tobacco abuse 12/19/2022   Melena 12/19/2022   Alcohol abuse 10/04/2020

## 2023-01-08 NOTE — ED Notes (Signed)
Patient is in the group room with staff having a group meeting. Alert,will continue to monitor for safety. 

## 2023-01-08 NOTE — [facility] Group Notes (Signed)
[facility] LCSW Group Therapy Note  Date/Time: 01/08/2023 at 1:30pm  Type of Therapy/Topic:  Group Therapy:  Balance in Life  Participation Level:  Active  Description of Group:   This group will address the importance of considering the journey and not just the destination. Patients will be encouraged to process areas in their lives where they found it hard to focus on the good rather than the bad, and identify reasons for maintaining such thought pattern. Facilitator will guide patients utilizing problem- solving interventions to address and improve the way each patient views themselves and their situation. Patients will work through understanding and applying humility when it comes to life changes and the interactions we have with others. Patients will be encouraged to explore ways that they will move forward throughout life's journey, and make healthier decisions for themselves.   Therapeutic Goals: Patient will identify two or more emotions or situations that they observed or felt throughout watching the video clip. Patient will identify signs where they may have responded to someone or situation due to their current circumstance.  Patient will identify two ways to set better habits in order to achieve more peace in their lives. Patient will demonstrate ability to communicate their needs through discussion and/or role plays.  Summary of Patient Progress: Patient actively participated in group on today.  Patient reports feeling encouraged from watching the video. Patient reports she is going to be more considerate and patient with people that trigger her easily. Patient was able to provide feedback to staff and peers.    Therapeutic Modalities:   Cognitive Behavioral Therapy Solution-Focused Therapy Assertiveness Training  Fernande Boyden, [place] Clinical Social Worker [facility] Ph: [phone]

## 2023-01-08 NOTE — ED Provider Notes (Signed)
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 10/09/2022 103.9 (H)  80.0 - 100.0 fL Final   MCH 10/09/2022 37.5 (H)  26.0 - 34.0 pg Final   MCHC 10/09/2022 36.1 (H)  30.0 - 36.0 g/dL Final   RDW 02/72/5366 11.6  11.5 - 15.5 % Final   Platelets 10/09/2022 157  150 - 400 K/uL Final   nRBC 10/09/2022 0.0  0.0 - 0.2 % Final   Performed at [facility], [address], [place], [place] [address]   Color, Urine 10/09/2022 AMBER (A)  YELLOW Final   BIOCHEMICALS MAY BE AFFECTED BY COLOR   APPearance 10/09/2022 CLOUDY (A)  CLEAR Final   Specific Gravity, Urine 10/09/2022 1.020  1.005 - 1.030  Final   pH 10/09/2022 6.0  5.0 - 8.0 Final   Glucose, UA 10/09/2022 NEGATIVE  NEGATIVE mg/dL Final   Hgb urine dipstick 10/09/2022 SMALL (A)  NEGATIVE Final   Bilirubin Urine 10/09/2022 NEGATIVE  NEGATIVE Final   Ketones, ur 10/09/2022 80 (A)  NEGATIVE mg/dL Final   Protein, ur 74/25/9563 >=300 (A)  NEGATIVE mg/dL Final   Nitrite 87/56/4332 NEGATIVE  NEGATIVE Final   Leukocytes,Ua 10/09/2022 SMALL (A)  NEGATIVE Final   RBC / HPF 10/09/2022 6-10  0 - 5 RBC/hpf Final   WBC, UA 10/09/2022 21-50  0 - 5 WBC/hpf Final   Bacteria, UA 10/09/2022 MANY (A)  NONE SEEN Final   Squamous Epithelial / HPF 10/09/2022 0-5  0 - 5 /HPF Final   Mucus 10/09/2022 PRESENT   Final   Performed at [facility], [address], [place], [place] [address]   Magnesium 10/09/2022 1.6 (L)  1.7 - 2.4 mg/dL Final   Performed at [facility], [address] [place], [place] [address]  Admission on 09/06/2022, Discharged on 09/06/2022  Component Date Value Ref Range Status   Lipase 09/06/2022 37  11 -  51 U/L Final   Performed at [facility], [address] [place], [place] [address]   Sodium 09/06/2022 143  135 - 145 mmol/L Final   Potassium 09/06/2022 3.9  3.5 - 5.1 mmol/L Final   Chloride 09/06/2022 105  98 - 111 mmol/L Final   CO2 09/06/2022 27  22 - 32 mmol/L Final   Glucose, Bld 09/06/2022 125 (H)  70 - 99 mg/dL Final   Glucose reference range applies only to samples taken after fasting for at least 8 hours.   BUN 09/06/2022 16  6 - 20 mg/dL Final   Creatinine, Ser 09/06/2022 0.72  0.44 - 1.00 mg/dL Final   Calcium 19/14/7829 9.0  8.9 - 10.3 mg/dL Final   Total Protein 56/21/3086 8.3 (H)  6.5 - 8.1 g/dL Final   Albumin 57/84/6962 4.2  3.5 - 5.0 g/dL Final   AST 95/28/4132 150 (H)  15 - 41 U/L Final   ALT 09/06/2022 110 (H)  0 - 44 U/L Final   Alkaline Phosphatase 09/06/2022 58  38 - 126 U/L Final   Total Bilirubin 09/06/2022 0.5  0.3 - 1.2 mg/dL Final   GFR, Estimated  09/06/2022 >60  >60 mL/min Final   Comment: (NOTE) Calculated using the CKD-EPI Creatinine Equation (2021)    Anion gap 09/06/2022 11  5 - 15 Final   Performed at [facility], [address] [place], [place] [address]   WBC 09/06/2022 7.5  4.0 - 10.5 K/uL Final   RBC 09/06/2022 3.61 (L)  3.87 - 5.11 MIL/uL Final   Hemoglobin 09/06/2022 14.0  12.0 - 15.0 g/dL Final   HCT 27/25/3664 40.2  36.0 - 46.0 % Final   MCV 09/06/2022 111.4 (H)  80.0 - 100.0 fL Final   MCH 09/06/2022 38.8 (H)  26.0 - 34.0 pg Final   MCHC 09/06/2022 34.8  30.0 - 36.0 g/dL Final   RDW 40/34/7425 12.9  11.5 - 15.5 % Final   Platelets 09/06/2022 253  150 - 400 K/uL Final   nRBC 09/06/2022 0.0  0.0 - 0.2 % Final   Performed at [facility], [address] [place], [place] [address]   Color, Urine 09/06/2022 YELLOW  YELLOW Final   APPearance 09/06/2022 HAZY (A)  CLEAR Final   Specific Gravity, Urine 09/06/2022 1.020  1.005 - 1.030 Final   pH 09/06/2022 7.0  5.0 - 8.0 Final   Glucose, UA 09/06/2022 NEGATIVE  NEGATIVE mg/dL Final   Hgb urine dipstick 09/06/2022 NEGATIVE  NEGATIVE Final   Bilirubin Urine 09/06/2022 NEGATIVE  NEGATIVE Final   Ketones, ur 09/06/2022 NEGATIVE  NEGATIVE mg/dL Final   Protein, ur 75/64/3329 >=300 (A)  NEGATIVE mg/dL Final   Nitrite 51/88/4166 NEGATIVE  NEGATIVE Final   Leukocytes,Ua 09/06/2022 SMALL (A)  NEGATIVE Final   WBC, UA 09/06/2022 >50 (H)  0 - 5 WBC/hpf Final   Bacteria, UA 09/06/2022 RARE (A)  NONE SEEN Final   Squamous Epithelial / HPF 09/06/2022 11-20  0 - 5 Final   Mucus 09/06/2022 PRESENT   Final   Performed at [facility], [address], [place], [place] [address]  Admission on 07/26/2022, Discharged on 07/26/2022  Component Date Value Ref Range Status   WBC 07/26/2022 8.6  4.0 - 10.5 K/uL Final   RBC 07/26/2022 3.88  3.87 - 5.11 MIL/uL Final   Hemoglobin 07/26/2022 14.9  12.0 - 15.0 g/dL Final   HCT 60/07/9322 40.9  36.0 - 46.0 % Final   MCV 07/26/2022 105.4 (H)  80.0  -  100.0 fL Final   MCH 07/26/2022 38.4 (H)  26.0 - 34.0 pg Final   MCHC 07/26/2022 36.4 (H)  30.0 - 36.0 g/dL Final   RDW 16/07/9603 12.4  11.5 - 15.5 % Final   Platelets 07/26/2022 165  150 - 400 K/uL Final   nRBC 07/26/2022 0.0  0.0 - 0.2 % Final   Neutrophils Relative % 07/26/2022 83  % Final   Neutro Abs 07/26/2022 7.2  1.7 - 7.7 K/uL Final   Lymphocytes Relative 07/26/2022 11  % Final   Lymphs Abs 07/26/2022 0.9  0.7 - 4.0 K/uL Final   Monocytes Relative 07/26/2022 5  % Final   Monocytes Absolute 07/26/2022 0.4  0.1 - 1.0 K/uL Final   Eosinophils Relative 07/26/2022 0  % Final   Eosinophils Absolute 07/26/2022 0.0  0.0 - 0.5 K/uL Final   Basophils Relative 07/26/2022 1  % Final   Basophils Absolute 07/26/2022 0.0  0.0 - 0.1 K/uL Final   Immature Granulocytes 07/26/2022 0  % Final   Abs Immature Granulocytes 07/26/2022 0.03  0.00 - 0.07 K/uL Final   Performed at [facility], [address], [place], [place] [address]   Sodium 07/26/2022 136  135 - 145 mmol/L Final   Potassium 07/26/2022 2.8 (L)  3.5 - 5.1 mmol/L Final   Chloride 07/26/2022 95 (L)  98 - 111 mmol/L Final   CO2 07/26/2022 28  22 - 32 mmol/L Final   Glucose, Bld 07/26/2022 120 (H)  70 - 99 mg/dL Final   Glucose reference range applies only to samples taken after fasting for at least 8 hours.   BUN 07/26/2022 23 (H)  6 - 20 mg/dL Final   Creatinine, Ser 07/26/2022 0.67  0.44 - 1.00 mg/dL Final   Calcium 11/91/4782 9.7  8.9 - 10.3 mg/dL Final   Total Protein 95/62/1308 9.1 (H)  6.5 - 8.1 g/dL Final   Albumin 65/78/4696 4.8  3.5 - 5.0 g/dL Final   AST 29/52/8413 191 (H)  15 - 41 U/L Final   ALT 07/26/2022 132 (H)  0 - 44 U/L Final   Alkaline Phosphatase 07/26/2022 73  38 - 126 U/L Final   Total Bilirubin 07/26/2022 1.2  0.3 - 1.2 mg/dL Final   GFR, Estimated 07/26/2022 >60  >60 mL/min Final   Comment: (NOTE) Calculated using the CKD-EPI Creatinine Equation (2021)    Anion gap 07/26/2022 13  5 - 15 Final    Performed at [facility], [address], [place], [place] [address]   SARS Coronavirus 2 by RT PCR 07/26/2022 NEGATIVE  NEGATIVE Final   Comment: (NOTE) SARS-CoV-2 target nucleic acids are NOT DETECTED.  The SARS-CoV-2 RNA is generally detectable in upper respiratory specimens during the acute phase of infection. The lowest concentration of SARS-CoV-2 viral copies this assay can detect is 138 copies/mL. A negative result does not preclude SARS-Cov-2 infection and should not be used as the sole basis for treatment or other patient management decisions. A negative result may occur with  improper specimen collection/handling, submission of specimen other than nasopharyngeal swab, presence of viral mutation(s) within the areas targeted by this assay, and inadequate number of viral copies(<138 copies/mL). A negative result must be combined with clinical observations, patient history, and epidemiological information. The expected result is Negative.  Fact Sheet for Patients:  BloggerCourse.com  Fact Sheet for Healthcare Providers:  SeriousBroker.it  This test is no  t yet approved or cleared by the [place] and  has been authorized for detection and/or diagnosis of SARS-CoV-2 by FDA under an Emergency Use Authorization (EUA). This EUA will remain  in effect (meaning this test can be used) for the duration of the COVID-19 declaration under Section 564(b)(1) of the Act, 21 U.S.C.section 360bbb-3(b)(1), unless the authorization is terminated  or revoked sooner.       Influenza A by PCR 07/26/2022 NEGATIVE  NEGATIVE Final   Influenza B by PCR 07/26/2022 NEGATIVE  NEGATIVE Final   Comment: (NOTE) The Xpert Xpress SARS-CoV-2/FLU/RSV plus assay is intended as an aid in the diagnosis of influenza from Nasopharyngeal swab specimens and should not be used as a sole basis for treatment. Nasal washings  and aspirates are unacceptable for Xpert Xpress SARS-CoV-2/FLU/RSV testing.  Fact Sheet for Patients: BloggerCourse.com  Fact Sheet for Healthcare Providers: SeriousBroker.it  This test is not yet approved or cleared by the [place] FDA and has been authorized for detection and/or diagnosis of SARS-CoV-2 by FDA under an Emergency Use Authorization (EUA). This EUA will remain in effect (meaning this test can be used) for the duration of the COVID-19 declaration under Section 564(b)(1) of the Act, 21 U.S.C. section 360bbb-3(b)(1), unless the authorization is terminated or revoked.  Performed at [facility], [address] [place], [place] [address]    Troponin I (High Sensitivity) 07/26/2022 12  <18 ng/L Final   Comment: (NOTE) Elevated high sensitivity troponin I (hsTnI) values and significant  changes across serial measurements may suggest ACS but many other  chronic and acute conditions are known to elevate hsTnI results.  Refer to the "Links" section for chest pain algorithms and additional  guidance. Performed at [facility], [address] [place], [place] [address]    Troponin I (High Sensitivity) 07/26/2022 13  <18 ng/L Final   Comment: (NOTE) Elevated high sensitivity troponin I (hsTnI) values and significant  changes across serial measurements may suggest ACS but many other  chronic and acute conditions are known to elevate hsTnI results.  Refer to the "Links" section for chest pain algorithms and additional  guidance. Performed at [facility], [address], [place], [place] [address]    Lipase 07/26/2022 220 (H)  11 - 51 U/L Final   Performed at [facility], [address] [place], [place] [address]    Blood Alcohol level:  Lab Results  Component Value Date   [facility] <10 01/01/2023    Metabolic Disorder Labs: Lab Results  Component Value Date   HGBA1C 5.5 01/01/2023   MPG 111 01/01/2023   No results  found for: "PROLACTIN" Lab Results  Component Value Date   CHOL 90 12/19/2022   TRIG 53 12/19/2022   HDL 39 (L) 12/19/2022   CHOLHDL 2.3 12/19/2022   VLDL 11 12/19/2022   LDLCALC 40 12/19/2022    Therapeutic Lab Levels: No results found for: "LITHIUM" No results found for: "VALPROATE" No results found for: "CBMZ"  Physical Findings   PHQ2-9    Flowsheet Row ED from 01/01/2023 in [facility] ED from 10/04/2020 in [facility]  PHQ-2 Total Score 1 6  PHQ-9 Total Score 9 21      Flowsheet Row ED from 01/01/2023 in [facility] ED to Hosp-Admission (Discharged) from 12/18/2022 in [facility] ED from 10/09/2022 in [facility]  C-SSRS RISK CATEGORY No Risk No Risk  No Risk        Musculoskeletal  Strength & Muscle Tone: within normal limits Gait & Station: normal Patient leans: N/A  Psychiatric Specialty Exam  General Appearance: appears at stated age, casually dressed  Behavior: pleasant and cooperative   Psychomotor Activity: no psychomotor agitation or retardation noted   Eye Contact: good  Speech: normal amount, tone, volume and fluency    Mood: Less depressed Affect: Congruent, brighter  Thought Process: linear, goal directed, no circumstantial or tangential thought process noted, no racing thoughts or flight of ideas  Descriptions of Associations: intact   Thought Content Hallucinations: denies AH, VH , does not appear responding to stimuli  Delusions: no paranoia, delusions of control, grandeur, ideas of reference, thought broadcasting, and magical thinking  Suicidal Thoughts: denies SI, intention, plan  Homicidal Thoughts: denies HI, intention, plan   Alertness/Orientation: alert and fully oriented   Insight: fair Judgment: fair  Memory: intact   Executive Functions  Concentration: intact  Attention Span:  fair  Recall: intact  Fund of Knowledge: fair   Psychomotor Activity  Psychomotor Activity: Normal  Assets  Assets: Manufacturing systems engineer; Desire for Improvement; Social Support  Sleep  Sleep: Good   Physical Exam   Review of Systems  Constitutional: Negative.  Negative for chills, fever and weight loss.  HENT: Negative.    Eyes: Negative.   Respiratory: Negative.    Cardiovascular: Negative.   Gastrointestinal:  Negative for diarrhea, nausea and vomiting.  Genitourinary: Negative.   Musculoskeletal: Negative Skin: Negative.   Neurological: Negative.  Negative for tingling.   Blood pressure 90/73, pulse 79, temperature 97.8 F (36.6 C), temperature source Oral, resp. rate 16, last menstrual period 01/05/2021, SpO2 96 %. There is no height or weight on file to calculate BMI.  Treatment Plan Summary: Daily contact with patient to assess and evaluate symptoms and progress in treatment Patient continues to do well and is on track for residential substance use treatment. She reports improvements to mood with current regimen. She is advised to be aware of signs of mood activation and to inform our team should she notice those changes.   Continue PRN's: Tylenol, Maalox, Atarax, Milk of Magnesia, Trazodone, Zofran, Imodium, Miralax  AUD c/b alcohol-induced depressive disorder - Continue  po thiamine and MVI replacement - Continue Prozac 20 mg daily.  NRT - Continue Nicotine patch 14 mg/24 hr  -Continue Nicotine gum 2 mg PRN Q4H  Constipation Better after Dulcolax - Continue Senna and Miralax.   Dispo - [facility] 01/09/23  Lamar Sprinkles, MD 01/08/2023, 12:16 PM

## 2023-01-08 NOTE — ED Notes (Signed)
Patient requested nicotine gum. 

## 2023-01-08 NOTE — ED Notes (Signed)
Patient requested medication for back pain was given tylenol.

## 2023-01-08 NOTE — ED Notes (Signed)
Patient is watching tv in the dayroom. Environment secured. Alert and oriented. Will continue to monitor for safety. 

## 2023-01-08 NOTE — ED Notes (Signed)
Pt is in the bed resting. Respirations are even and unlabored. No acute distress noted. Will continue to monitor for safety 

## 2023-01-08 NOTE — ED Notes (Signed)
Patient  sleeping in no acute stress. RR even and unlabored .Environment secured .Will continue to monitor for safely. 

## 2023-01-09 DIAGNOSIS — Z8719 Personal history of other diseases of the digestive system: Secondary | ICD-10-CM | POA: Diagnosis not present

## 2023-01-09 DIAGNOSIS — F419 Anxiety disorder, unspecified: Secondary | ICD-10-CM | POA: Diagnosis not present

## 2023-01-09 DIAGNOSIS — F1010 Alcohol abuse, uncomplicated: Secondary | ICD-10-CM | POA: Diagnosis not present

## 2023-01-09 DIAGNOSIS — Z1152 Encounter for screening for COVID-19: Secondary | ICD-10-CM | POA: Diagnosis not present

## 2023-01-09 MED ORDER — HYDROXYZINE HCL 25 MG PO TABS: 25.0000 mg | ORAL_TABLET | Freq: Every evening | ORAL | 0 refills | Status: DC | PRN

## 2023-01-09 MED ORDER — NICOTINE POLACRILEX 2 MG MT GUM: 2.0000 mg | CHEWING_GUM | OROMUCOSAL | 0 refills | Status: AC | PRN

## 2023-01-09 MED ORDER — TRAZODONE HCL 100 MG PO TABS: 100.0000 mg | ORAL_TABLET | Freq: Every evening | ORAL | 0 refills | Status: DC | PRN

## 2023-01-09 MED ORDER — FLUOXETINE HCL 20 MG PO CAPS: 20.0000 mg | ORAL_CAPSULE | Freq: Every day | ORAL | 0 refills | Status: DC

## 2023-01-09 MED ORDER — NICOTINE 21 MG/24HR TD PT24: 21.0000 mg | MEDICATED_PATCH | Freq: Every day | TRANSDERMAL | 0 refills | Status: DC

## 2023-01-09 MED ORDER — SENNA 8.6 MG PO TABS: 2.0000 | ORAL_TABLET | Freq: Every day | ORAL | 0 refills | Status: DC

## 2023-01-09 NOTE — ED Notes (Signed)
MHT went into room to get the vitals and she screamed "not today I'm sleeping stop ruining my sleep"

## 2023-01-09 NOTE — ED Notes (Signed)
Patient A&O x 4, ambulatory. Patient discharged in no acute distress. Patient denied SI/HI, A/VH upon discharge. Patient verbalized understanding of all discharge instructions explained by staff, to include follow up appointments, RX's and safety plan. Patient reported mood 10/10.  Pt belongings returned to patient from locker #26  intact. Patient escorted to lobby via staff for transport to [facility] via [employer] taxi service. Safety maintained.

## 2023-01-09 NOTE — ED Notes (Signed)
Pt sleeping in no acute distress. RR even and unlabored. Environment secured. Will continue to monitor for safety. 

## 2023-01-09 NOTE — ED Notes (Signed)
Pt is sleeping. No distress noted. Will continue to monitor for safety. 

## 2023-01-09 NOTE — ED Notes (Signed)
Patient was provided with breakfast 

## 2023-01-21 ENCOUNTER — Encounter: Payer: Self-pay | Admitting: Physician Assistant

## 2023-01-21 ENCOUNTER — Ambulatory Visit: Payer: Medicaid Other | Admitting: Gastroenterology

## 2023-01-21 ENCOUNTER — Ambulatory Visit: Payer: Medicaid Other | Admitting: Physician Assistant

## 2023-01-21 VITALS — BP 88/62 | HR 63 | Ht 60.0 in | Wt 114.0 lb

## 2023-01-21 DIAGNOSIS — R748 Abnormal levels of other serum enzymes: Secondary | ICD-10-CM | POA: Diagnosis not present

## 2023-01-21 DIAGNOSIS — I10 Essential (primary) hypertension: Secondary | ICD-10-CM

## 2023-01-21 DIAGNOSIS — F17210 Nicotine dependence, cigarettes, uncomplicated: Secondary | ICD-10-CM | POA: Diagnosis not present

## 2023-01-21 DIAGNOSIS — Z720 Tobacco use: Secondary | ICD-10-CM

## 2023-01-21 DIAGNOSIS — K5900 Constipation, unspecified: Secondary | ICD-10-CM

## 2023-01-21 DIAGNOSIS — F5104 Psychophysiologic insomnia: Secondary | ICD-10-CM | POA: Diagnosis not present

## 2023-01-21 DIAGNOSIS — F411 Generalized anxiety disorder: Secondary | ICD-10-CM

## 2023-01-21 DIAGNOSIS — M5441 Lumbago with sciatica, right side: Secondary | ICD-10-CM | POA: Diagnosis not present

## 2023-01-21 DIAGNOSIS — F1210 Cannabis abuse, uncomplicated: Secondary | ICD-10-CM | POA: Diagnosis not present

## 2023-01-21 DIAGNOSIS — F1010 Alcohol abuse, uncomplicated: Secondary | ICD-10-CM

## 2023-01-21 DIAGNOSIS — R7689 Other specified abnormal immunological findings in serum: Secondary | ICD-10-CM

## 2023-01-21 DIAGNOSIS — R768 Other specified abnormal immunological findings in serum: Secondary | ICD-10-CM

## 2023-01-21 MED ORDER — SENNA 8.6 MG PO TABS
2.0000 | ORAL_TABLET | Freq: Every day | ORAL | 1 refills | Status: DC
Start: 2023-01-21 — End: 2023-07-09

## 2023-01-21 MED ORDER — AMLODIPINE BESYLATE 5 MG PO TABS
5.0000 mg | ORAL_TABLET | Freq: Every day | ORAL | 1 refills | Status: AC
Start: 2023-01-21 — End: ?

## 2023-01-21 MED ORDER — NICOTINE 21 MG/24HR TD PT24
21.0000 mg | MEDICATED_PATCH | Freq: Every day | TRANSDERMAL | 1 refills | Status: AC
Start: 2023-01-21 — End: ?

## 2023-01-21 MED ORDER — CYCLOBENZAPRINE HCL 10 MG PO TABS
10.0000 mg | ORAL_TABLET | Freq: Three times a day (TID) | ORAL | 1 refills | Status: DC | PRN
Start: 2023-01-21 — End: 2023-07-09

## 2023-01-21 MED ORDER — TRAZODONE HCL 100 MG PO TABS
100.0000 mg | ORAL_TABLET | Freq: Every evening | ORAL | 1 refills | Status: DC | PRN
Start: 2023-01-21 — End: 2023-05-26

## 2023-01-21 MED ORDER — FLUOXETINE HCL 20 MG PO CAPS
20.0000 mg | ORAL_CAPSULE | Freq: Every day | ORAL | 1 refills | Status: DC
Start: 2023-01-21 — End: 2023-05-26

## 2023-01-21 MED ORDER — HYDROXYZINE HCL 25 MG PO TABS: ORAL_TABLET | ORAL | 1 refills | Status: DC

## 2023-01-21 NOTE — Progress Notes (Unsigned)
New Patient Office Visit  Subjective    Patient ID: Kaitlin Carpenter, female    DOB: [birth year]  Age: 52 y.o. MRN: [id number]  CC:  Chief Complaint  Patient presents with   Weight Loss    Leg and back spasm, mostly happens at night, when she wakes up she has bruising in her ankles and legs    Medication Refill    HPI Kaitlin Carpenter states that she is currently being treated for substance abuse at [facility] residential treatment center.  States that she arrived on April 11.  States that she plans on going home to [place] after she has finished her treatment.  States that she recently received Medicaid and will be using [facility] for her primary care provider.  States that she was recently started on amlodipine, denies history of hypertension.  Does not check her blood pressure at home.  Denies any hypotensive symptoms.  States that she has a significant history of sciatica, states that she used to ride motorcycles and horses a lot and was told that this was causing the sciatica.  States that she has been having pain in her lower right back that travels down her right leg, describes it as shocks.  States that it happens mostly at night, states that it is making it difficult for her to sleep.  States that she is taking trazodone for sleep.  States that her anxiety has been elevated, states that she would like to be able to take the hydroxyzine more often.  States that she has difficulty with constipation, states that this has been ongoing for "a long time".  States that she has a bowel movement approximately once a week.  States that she is concerned over weight loss, states that she feels she is eating plenty of food. (Weight 09/06/22 115 - today 114) Weight 12/2021 130 lb   Outpatient Encounter Medications as of 01/21/2023  Medication Sig   amLODipine (NORVASC) 5 MG tablet Take 1 tablet (5 mg total) by mouth daily.   cyclobenzaprine (FLEXERIL) 10 MG tablet Take 1  tablet (10 mg total) by mouth 3 (three) times daily as needed for muscle spasms.   FLUoxetine (PROZAC) 20 MG capsule Take 1 capsule (20 mg total) by mouth daily.   hydrOXYzine (ATARAX) 25 MG tablet Take 1/2 - 1 full tab PO q6hrs PRN   nicotine (NICODERM CQ - DOSED IN MG/24 HOURS) 21 mg/24hr patch Place 1 patch (21 mg total) onto the skin daily.   nicotine polacrilex (NICORETTE) 2 MG gum Take 1-2 each (2-4 mg total) by mouth as needed for smoking cessation.   senna (SENOKOT) 8.6 MG TABS tablet Take 2 tablets (17.2 mg total) by mouth daily.   traZODone (DESYREL) 100 MG tablet Take 1 tablet (100 mg total) by mouth at bedtime as needed for sleep.   [DISCONTINUED] FLUoxetine (PROZAC) 20 MG capsule Take 1 capsule (20 mg total) by mouth daily.   [DISCONTINUED] hydrOXYzine (ATARAX) 25 MG tablet Take 1 tablet (25 mg total) by mouth at bedtime as needed for anxiety.   [DISCONTINUED] nicotine (NICODERM CQ - DOSED IN MG/24 HOURS) 21 mg/24hr patch Place 1 patch (21 mg total) onto the skin daily.   [DISCONTINUED] senna (SENOKOT) 8.6 MG TABS tablet Take 2 tablets (17.2 mg total) by mouth daily.   [DISCONTINUED] traZODone (DESYREL) 100 MG tablet Take 1 tablet (100 mg total) by mouth at bedtime as needed for sleep.   No facility-administered encounter medications on  file as of 01/21/2023.    Past Medical History:  Diagnosis Date   Essential hypertension 01/23/2023    Past Surgical History:  Procedure Laterality Date   NECK SURGERY     SHOULDER SURGERY Left     Family History  Family history unknown: Yes    Social History   Socioeconomic History   Marital status: Divorced    Spouse name: Not on file   Number of children: Not on file   Years of education: Not on file   Highest education level: Not on file  Occupational History   Not on file  Tobacco Use   Smoking status: Every Day    Packs/day: 1    Types: Cigarettes   Smokeless tobacco: Never  Vaping Use   Vaping Use: Never used   Substance and Sexual Activity   Alcohol use: Yes    Alcohol/week: 6.0 standard drinks of alcohol    Types: 6 Cans of beer per week   Drug use: Never   Sexual activity: Not Currently  Other Topics Concern   Not on file  Social History Narrative   Not on file   Social Determinants of Health   Financial Resource Strain: Not on file  Food Insecurity: No Food Insecurity (01/02/2023)   Hunger Vital Sign    Worried About Running Out of Food in the Last Year: Never true    Ran Out of Food in the Last Year: Never true  Transportation Needs: Unmet Transportation Needs (01/02/2023)   PRAPARE - Administrator, Civil Service (Medical): Yes    Lack of Transportation (Non-Medical): Yes  Physical Activity: Not on file  Stress: Not on file  Social Connections: Not on file  Intimate Partner Violence: Not At Risk (01/02/2023)   Humiliation, Afraid, Rape, and Kick questionnaire    Fear of Current or Ex-Partner: No    Emotionally Abused: No    Physically Abused: No    Sexually Abused: No    Review of Systems  Constitutional: Negative.   HENT: Negative.    Eyes: Negative.   Respiratory:  Negative for shortness of breath.   Cardiovascular:  Negative for chest pain.  Gastrointestinal:  Positive for constipation. Negative for nausea and vomiting.  Genitourinary: Negative.   Musculoskeletal:  Positive for back pain and myalgias.  Skin: Negative.   Neurological: Negative.   Endo/Heme/Allergies: Negative.   Psychiatric/Behavioral:  Negative for depression. The patient has insomnia. The patient is not nervous/anxious.         Objective    BP (!) 88/62 (BP Location: Right Arm, Patient Position: Sitting, Cuff Size: Normal)   Pulse 63   Ht 5' (1.524 m)   Wt 114 lb (51.7 kg)   LMP 01/05/2021 (Within Days) Comment: Pt has not been active in a year and pt states she's not getting periods anymore.  HC 4" (10.2 cm)   SpO2 98%   BMI 22.26 kg/m   Physical Exam Vitals and nursing note  reviewed.  Constitutional:      Appearance: Normal appearance.  HENT:     Head: Normocephalic and atraumatic.     Right Ear: External ear normal.     Left Ear: External ear normal.     Nose: Nose normal.     Mouth/Throat:     Mouth: Mucous membranes are moist.     Pharynx: Oropharynx is clear.  Eyes:     Extraocular Movements: Extraocular movements intact.     Conjunctiva/sclera: Conjunctivae normal.  Pupils: Pupils are equal, round, and reactive to light.  Cardiovascular:     Rate and Rhythm: Normal rate and regular rhythm.     Pulses: Normal pulses.          Dorsalis pedis pulses are 2+ on the right side and 2+ on the left side.       Posterior tibial pulses are 2+ on the right side and 2+ on the left side.     Heart sounds: Normal heart sounds.  Pulmonary:     Effort: Pulmonary effort is normal.     Breath sounds: Normal breath sounds.  Musculoskeletal:     Cervical back: Normal, normal range of motion and neck supple.     Thoracic back: Normal.     Lumbar back: Tenderness present. Decreased range of motion.     Comments: Pain elicited with range of motion testing  Skin:    General: Skin is warm and dry.  Neurological:     General: No focal deficit present.     Mental Status: She is alert and oriented to person, place, and time.  Psychiatric:        Mood and Affect: Mood normal.        Behavior: Behavior normal.        Thought Content: Thought content normal.        Judgment: Judgment normal.         Assessment & Plan:   Problem List Items Addressed This Visit       Cardiovascular and Mediastinum   Essential hypertension   Relevant Medications   amLODipine (NORVASC) 5 MG tablet     Nervous and Auditory   Acute right-sided low back pain with right-sided sciatica   Relevant Medications   cyclobenzaprine (FLEXERIL) 10 MG tablet   traZODone (DESYREL) 100 MG tablet   nicotine (NICODERM CQ - DOSED IN MG/24 HOURS) 21 mg/24hr patch   hydrOXYzine (ATARAX) 25  MG tablet   FLUoxetine (PROZAC) 20 MG capsule   Other Relevant Orders   Vitamin D, 25-hydroxy     Other   Alcohol abuse   Tobacco abuse   Relevant Medications   nicotine (NICODERM CQ - DOSED IN MG/24 HOURS) 21 mg/24hr patch   GAD (generalized anxiety disorder) - Primary   Relevant Medications   traZODone (DESYREL) 100 MG tablet   hydrOXYzine (ATARAX) 25 MG tablet   FLUoxetine (PROZAC) 20 MG capsule   Other Relevant Orders   Vitamin D, 25-hydroxy   Psychophysiological insomnia   Relevant Medications   traZODone (DESYREL) 100 MG tablet   Marijuana abuse   Other Visit Diagnoses     Elevated liver enzymes       Relevant Orders   Hepatic Function Panel   Acute Viral Hepatitis (HAV, HBV, HCV)   Constipation, unspecified constipation type       Relevant Medications   senna (SENOKOT) 8.6 MG TABS tablet      1. GAD (generalized anxiety disorder) Increase hydroxyzine to every 6 hours as needed.  Continue Prozac.  Patient to follow-up with mobile unit in 2 weeks. - Vitamin D, 25-hydroxy - hydrOXYzine (ATARAX) 25 MG tablet; Take 1/2 - 1 full tab PO q6hrs PRN  Dispense: 60 tablet; Refill: 1 - FLUoxetine (PROZAC) 20 MG capsule; Take 1 capsule (20 mg total) by mouth daily.  Dispense: 30 capsule; Refill: 1  2. Psychophysiological insomnia Continue current regimen - traZODone (DESYREL) 100 MG tablet; Take 1 tablet (100 mg total) by mouth at bedtime as needed  for sleep.  Dispense: 30 tablet; Refill: 1  3. Acute right-sided low back pain with right-sided sciatica Trial Flexeril.  Patient education given on supportive care - cyclobenzaprine (FLEXERIL) 10 MG tablet; Take 1 tablet (10 mg total) by mouth 3 (three) times daily as needed for muscle spasms.  Dispense: 30 tablet; Refill: 1 - Vitamin D, 25-hydroxy  4. Essential hypertension Patient encouraged to check blood pressure prior to administering amlodipine, given directions on holding amlodipine based on blood pressure reading -  amLODipine (NORVASC) 5 MG tablet; Take 1 tablet (5 mg total) by mouth daily.  Dispense: 30 tablet; Refill: 1  5. Elevated liver enzymes  - Hepatic Function Panel - Acute Viral Hepatitis (HAV, HBV, HCV)  6. Constipation, unspecified constipation type Continue current regimen - senna (SENOKOT) 8.6 MG TABS tablet; Take 2 tablets (17.2 mg total) by mouth daily.  Dispense: 60 tablet; Refill: 1  7. Alcohol abuse Currently in substance abuse treatment program  8. Tobacco abuse  - nicotine (NICODERM CQ - DOSED IN MG/24 HOURS) 21 mg/24hr patch; Place 1 patch (21 mg total) onto the skin daily.  Dispense: 28 patch; Refill: 1  9. Marijuana abuse    I have reviewed the patient's medical history (PMH, PSH, Social History, Family History, Medications, and allergies) , and have been updated if relevant. I spent 40 minutes reviewing chart and  face to face time with patient.  No AVS created, no printer on the bus and patient does not have access to MyChart at this time.  Patient education given through teach back method.    Return in about 2 weeks (around 02/04/2023).   Kasandra Knudsen Mayers, PA-C

## 2023-01-23 ENCOUNTER — Encounter: Payer: Self-pay | Admitting: Physician Assistant

## 2023-01-23 DIAGNOSIS — M5441 Lumbago with sciatica, right side: Secondary | ICD-10-CM | POA: Insufficient documentation

## 2023-01-23 DIAGNOSIS — F411 Generalized anxiety disorder: Secondary | ICD-10-CM | POA: Insufficient documentation

## 2023-01-23 DIAGNOSIS — I10 Essential (primary) hypertension: Secondary | ICD-10-CM | POA: Insufficient documentation

## 2023-01-23 DIAGNOSIS — F5104 Psychophysiologic insomnia: Secondary | ICD-10-CM | POA: Insufficient documentation

## 2023-01-23 DIAGNOSIS — F1210 Cannabis abuse, uncomplicated: Secondary | ICD-10-CM | POA: Insufficient documentation

## 2023-01-23 HISTORY — DX: Essential (primary) hypertension: I10

## 2023-01-28 LAB — HEPATIC FUNCTION PANEL
ALT: 46 IU/L — ABNORMAL HIGH (ref 0–32)
AST: 39 IU/L (ref 0–40)
Albumin: 4.9 g/dL (ref 3.8–4.9)
Alkaline Phosphatase: 67 IU/L (ref 44–121)
Bilirubin Total: 0.2 mg/dL (ref 0.0–1.2)
Bilirubin, Direct: <0.1 mg/dL (ref 0.00–0.40)
Total Protein: 8.5 g/dL (ref 6.0–8.5)

## 2023-01-28 LAB — VITAMIN D 25 HYDROXY (VIT D DEFICIENCY, FRACTURES): Vit D, 25-Hydroxy: 36.6 ng/mL (ref 30.0–100.0)

## 2023-01-28 LAB — HCV RT-PCR, QUANT (NON-GRAPH)

## 2023-01-28 LAB — ACUTE VIRAL HEPATITIS (HAV, HBV, HCV)
HCV Ab: REACTIVE — AB
Hep A IgM: NEGATIVE
Hep B C IgM: NEGATIVE
Hepatitis B Surface Ag: NEGATIVE

## 2023-01-28 NOTE — Progress Notes (Deleted)
GI Office Note    Referring Provider: No ref. provider found Primary Care Physician:  Patient, [name]  Primary Gastroenterologist:  Chief Complaint   No chief complaint on file.   History of Present Illness   Kaitlin Carpenter is a 52 y.o. female presenting today    Recent admission 11/2022 with abdominal pain/vomiting with mildly elevated lipase and CT A/P with pancreatic head enlargement with areas of fluid attenuation seen within the adjacent pancreaticoduodenal groove-stable in size and appearance when compared to the prior study. mild amount of associated peripancreatic inflammatory fat stranding is noted. No pancreatic ductal dilatation is seen. Ruq u/s negative.  Reported drinking wine 4-5 days/week. Few glasses per day, few bottles per week.   Mother died of pancreatic cancer, diagnosed around age 59. Her son had pancreatitis and does not smoke or drink.   IgG, subclass 4 was normal.  Medications   Current Outpatient Medications  Medication Sig Dispense Refill   amLODipine (NORVASC) 5 MG tablet Take 1 tablet (5 mg total) by mouth daily. 30 tablet 1   cyclobenzaprine (FLEXERIL) 10 MG tablet Take 1 tablet (10 mg total) by mouth 3 (three) times daily as needed for muscle spasms. 30 tablet 1   FLUoxetine (PROZAC) 20 MG capsule Take 1 capsule (20 mg total) by mouth daily. 30 capsule 1   hydrOXYzine (ATARAX) 25 MG tablet Take 1/2 - 1 full tab PO q6hrs PRN 60 tablet 1   nicotine (NICODERM CQ - DOSED IN MG/24 HOURS) 21 mg/24hr patch Place 1 patch (21 mg total) onto the skin daily. 28 patch 1   nicotine polacrilex (NICORETTE) 2 MG gum Take 1-2 each (2-4 mg total) by mouth as needed for smoking cessation. 100 tablet 0   senna (SENOKOT) 8.6 MG TABS tablet Take 2 tablets (17.2 mg total) by mouth daily. 60 tablet 1   traZODone (DESYREL) 100 MG tablet Take 1 tablet (100 mg total) by mouth at bedtime as needed for sleep. 30 tablet 1   No current facility-administered medications  for this visit.    Allergies   Allergies as of 01/29/2023   (No Known Allergies)     Past Medical History   Past Medical History:  Diagnosis Date   Essential hypertension 01/23/2023    Past Surgical History   Past Surgical History:  Procedure Laterality Date   NECK SURGERY     SHOULDER SURGERY Left     Past Family History   Family History  Family history unknown: Yes    Past Social History   Social History   Socioeconomic History   Marital status: Divorced    Spouse name: Not on file   Number of children: Not on file   Years of education: Not on file   Highest education level: Not on file  Occupational History   Not on file  Tobacco Use   Smoking status: Every Day    Packs/day: 1    Types: Cigarettes   Smokeless tobacco: Never  Vaping Use   Vaping Use: Never used  Substance and Sexual Activity   Alcohol use: Yes    Alcohol/week: 6.0 standard drinks of alcohol    Types: 6 Cans of beer per week   Drug use: Never   Sexual activity: Not Currently  Other Topics Concern   Not on file  Social History Narrative   Not on file   Social Determinants of Health   Financial Resource Strain: Not on file  Food Insecurity:  No Food Insecurity (01/02/2023)   Hunger Vital Sign    Worried About Running Out of Food in the Last Year: Never true    Ran Out of Food in the Last Year: Never true  Transportation Needs: Unmet Transportation Needs (01/02/2023)   PRAPARE - Administrator, Civil Service (Medical): Yes    Lack of Transportation (Non-Medical): Yes  Physical Activity: Not on file  Stress: Not on file  Social Connections: Not on file  Intimate Partner Violence: Not At Risk (01/02/2023)   Humiliation, Afraid, Rape, and Kick questionnaire    Fear of Current or Ex-Partner: No    Emotionally Abused: No    Physically Abused: No    Sexually Abused: No    Review of Systems   General: Negative for anorexia, weight loss, fever, chills, fatigue,  weakness. ENT: Negative for hoarseness, difficulty swallowing , nasal congestion. CV: Negative for chest pain, angina, palpitations, dyspnea on exertion, peripheral edema.  Respiratory: Negative for dyspnea at rest, dyspnea on exertion, cough, sputum, wheezing.  GI: See history of present illness. GU:  Negative for dysuria, hematuria, urinary incontinence, urinary frequency, nocturnal urination.  Endo: Negative for unusual weight change.     Physical Exam   LMP 01/05/2021 (Within Days) Comment: Pt has not been active in a year and pt states she's not getting periods anymore.   General: Well-nourished, well-developed in no acute distress.  Eyes: No icterus. Mouth: Oropharyngeal mucosa moist and pink , no lesions erythema or exudate. Lungs: Clear to auscultation bilaterally.  Heart: Regular rate and rhythm, no murmurs rubs or gallops.  Abdomen: Bowel sounds are normal, nontender, nondistended, no hepatosplenomegaly or masses,  no abdominal bruits or hernia , no rebound or guarding.  Rectal: ***  Extremities: No lower extremity edema. No clubbing or deformities. Neuro: Alert and oriented x 4   Skin: Warm and dry, no jaundice.   Psych: Alert and cooperative, normal mood and affect.  Labs   Lab Results  Component Value Date   CREATININE 0.82 01/01/2023   BUN 7 01/01/2023   NA 135 01/01/2023   K 4.7 01/01/2023   CL 99 01/01/2023   CO2 30 01/01/2023   Lab Results  Component Value Date   ALT 57 (H) 01/07/2023   AST 55 (H) 01/07/2023   ALKPHOS 45 01/07/2023   BILITOT 0.1 (L) 01/07/2023   Lab Results  Component Value Date   WBC 6.1 01/01/2023   HGB 14.2 01/01/2023   HCT 42.9 01/01/2023   MCV 106.5 (H) 01/01/2023   PLT 261 01/01/2023   Lab Results  Component Value Date   LIPASE 204 (H) 12/19/2022   Lab Results  Component Value Date   CREATININE 0.82 01/01/2023   BUN 7 01/01/2023   NA 135 01/01/2023   K 4.7 01/01/2023   CL 99 01/01/2023   CO2 30 01/01/2023   No  results found for: "VITAMINB12" No results found for: "FOLATE" No results found for: "IRON", "TIBC", "FERRITIN"  Lab Results  Component Value Date   HGBA1C 5.5 01/01/2023     Imaging Studies   No results found.  Assessment       PLAN   *** MRI imaging of pancreas 10-12 weeks later   Leanna Battles. Melvyn Neth, MHS, PA-C [facility]

## 2023-01-28 NOTE — Addendum Note (Signed)
Addended by: Roney Jaffe on: 01/28/2023 12:04 PM   Modules accepted: Orders

## 2023-01-29 ENCOUNTER — Ambulatory Visit: Payer: Medicaid Other | Admitting: Gastroenterology

## 2023-01-29 ENCOUNTER — Encounter: Payer: Self-pay | Admitting: Gastroenterology

## 2023-01-30 DIAGNOSIS — F1020 Alcohol dependence, uncomplicated: Secondary | ICD-10-CM | POA: Diagnosis not present

## 2023-05-26 ENCOUNTER — Ambulatory Visit (HOSPITAL_COMMUNITY)
Admission: EM | Admit: 2023-05-26 | Discharge: 2023-05-26 | Disposition: A | Discharge status: Home / Self Care | Payer: MEDICAID | Attending: Physician Assistant | Admitting: Physician Assistant

## 2023-05-26 ENCOUNTER — Other Ambulatory Visit: Payer: Self-pay

## 2023-05-26 ENCOUNTER — Encounter (HOSPITAL_COMMUNITY): Payer: Self-pay | Admitting: Emergency Medicine

## 2023-05-26 DIAGNOSIS — M79604 Pain in right leg: Secondary | ICD-10-CM

## 2023-05-26 DIAGNOSIS — Z760 Encounter for issue of repeat prescription: Secondary | ICD-10-CM

## 2023-05-26 DIAGNOSIS — F411 Generalized anxiety disorder: Secondary | ICD-10-CM

## 2023-05-26 DIAGNOSIS — M79605 Pain in left leg: Secondary | ICD-10-CM | POA: Diagnosis present

## 2023-05-26 LAB — BASIC METABOLIC PANEL
Anion gap: 11 (ref 5–15)
BUN: 9 mg/dL (ref 6–20)
CO2: 27 mmol/L (ref 22–32)
Calcium: 9.5 mg/dL (ref 8.9–10.3)
Chloride: 97 mmol/L — ABNORMAL LOW (ref 98–111)
Creatinine, Ser: 0.77 mg/dL (ref 0.44–1.00)
GFR, Estimated: >60 mL/min (ref >60)
Glucose, Bld: 81 mg/dL (ref 70–99)
Potassium: 5.2 mmol/L — ABNORMAL HIGH (ref 3.5–5.1)
Sodium: 135 mmol/L (ref 135–145)

## 2023-05-26 LAB — CBC
HCT: 43.1 % (ref 36.0–46.0)
Hemoglobin: 14.4 g/dL (ref 12.0–15.0)
MCH: 36.1 pg — ABNORMAL HIGH (ref 26.0–34.0)
MCHC: 33.4 g/dL (ref 30.0–36.0)
MCV: 108 fL — ABNORMAL HIGH (ref 80.0–100.0)
Platelets: 281 10*3/uL (ref 150–400)
RBC: 3.99 MIL/uL (ref 3.87–5.11)
RDW: 13.8 % (ref 11.5–15.5)
WBC: 5.8 10*3/uL (ref 4.0–10.5)
nRBC: 0 % (ref 0.0–0.2)

## 2023-05-26 MED ORDER — FLUOXETINE HCL 20 MG PO CAPS
20.0000 mg | ORAL_CAPSULE | Freq: Every day | ORAL | 2 refills | Status: DC
Start: 2023-05-26 — End: 2023-07-09

## 2023-05-26 MED ORDER — GABAPENTIN 100 MG PO CAPS: 100.0000 mg | ORAL_CAPSULE | Freq: Three times a day (TID) | ORAL | 0 refills | Status: DC

## 2023-05-26 NOTE — Discharge Instructions (Addendum)
  Make appointment with primary care physician  If you develop chest pain, shortness of breath, or worsening symptoms follow up in the Emergency Department for further evaluation.

## 2023-05-26 NOTE — ED Provider Notes (Signed)
[facility]    CSN: [id number] Arrival date & time: 05/26/23  1135      History   Chief Complaint Chief Complaint  Patient presents with   Leg Swelling    HPI Kaitlin Carpenter is a 52 y.o. female.   Patient complains of bilateral lower extremity swelling and pain that started several days ago.  She denies injury or trauma.  She was taking amlodipine and stopped this about 1 week ago.  She denies shortness of breath, chest pain, calf tenderness.  She is leaving for a new PCP.  She is currently out of Prozac, reports has not taken in 5 days.  Per chart review patient has a history of alcohol abuse.  She reports some numbness and tingling to bilateral feet.  Pain is worse with walking first thing in the morning.    Past Medical History:  Diagnosis Date   Essential hypertension 01/23/2023    Patient Active Problem List   Diagnosis Date Noted   GAD (generalized anxiety disorder) 01/23/2023   Psychophysiological insomnia 01/23/2023   Acute right-sided low back pain with right-sided sciatica 01/23/2023   Essential hypertension 01/23/2023   Marijuana abuse 01/23/2023   Acute pancreatitis 12/19/2022   Acute alcoholic pancreatitis 12/19/2022   Hyponatremia 12/19/2022   Hypokalemia 12/19/2022   Tobacco abuse 12/19/2022   Melena 12/19/2022   Alcohol abuse 10/04/2020    Past Surgical History:  Procedure Laterality Date   NECK SURGERY     SHOULDER SURGERY Left     OB History   No obstetric history on file.      Home Medications    Prior to Admission medications   Medication Sig Start Date End Date Taking? Authorizing Provider  gabapentin (NEURONTIN) 100 MG capsule Take 1 capsule (100 mg total) by mouth 3 (three) times daily. 05/26/23 06/25/23 Yes Ward, Tylene Fantasia, PA-C  amLODipine (NORVASC) 5 MG tablet Take 1 tablet (5 mg total) by mouth daily. Patient not taking: Reported on 05/26/2023 01/21/23   Mayers, Cari S, PA-C  cyclobenzaprine (FLEXERIL) 10 MG tablet Take 1  tablet (10 mg total) by mouth 3 (three) times daily as needed for muscle spasms. Patient not taking: Reported on 05/26/2023 01/21/23   Mayers, Cari S, PA-C  FLUoxetine (PROZAC) 20 MG capsule Take 1 capsule (20 mg total) by mouth daily. 05/26/23   Ward, Tylene Fantasia, PA-C  hydrOXYzine (ATARAX) 25 MG tablet Take 1/2 - 1 full tab PO q6hrs PRN Patient not taking: Reported on 05/26/2023 01/21/23   Mayers, Cari S, PA-C  nicotine (NICODERM CQ - DOSED IN MG/24 HOURS) 21 mg/24hr patch Place 1 patch (21 mg total) onto the skin daily. Patient not taking: Reported on 05/26/2023 01/21/23   Mayers, Cari S, PA-C  nicotine polacrilex (NICORETTE) 2 MG gum Take 1-2 each (2-4 mg total) by mouth as needed for smoking cessation. Patient not taking: Reported on 05/26/2023 01/09/23   Lamar Sprinkles, MD  senna (SENOKOT) 8.6 MG TABS tablet Take 2 tablets (17.2 mg total) by mouth daily. Patient not taking: Reported on 05/26/2023 01/21/23   Mayers, Kasandra Knudsen, PA-C    Family History Family History  Family history unknown: Yes    Social History Social History   Tobacco Use   Smoking status: Every Day    Current packs/day: 1.00    Types: Cigarettes   Smokeless tobacco: Never  Vaping Use   Vaping status: Never Used  Substance Use Topics   Alcohol use: Yes    Alcohol/week: 6.0  standard drinks of alcohol    Types: 6 Cans of beer per week   Drug use: Never     Allergies   Patient has no known allergies.   Review of Systems Review of Systems  Constitutional:  Negative for chills and fever.  HENT:  Negative for ear pain and sore throat.   Eyes:  Negative for pain and visual disturbance.  Respiratory:  Negative for cough and shortness of breath.   Cardiovascular:  Positive for leg swelling. Negative for chest pain and palpitations.  Gastrointestinal:  Negative for abdominal pain and vomiting.  Genitourinary:  Negative for dysuria and hematuria.  Musculoskeletal:  Negative for arthralgias and back pain.  Skin:  Negative  for color change and rash.  Neurological:  Positive for numbness. Negative for seizures and syncope.  All other systems reviewed and are negative.    Physical Exam Triage Vital Signs ED Triage Vitals  Encounter Vitals Group     BP 05/26/23 1252 134/85     Systolic BP Percentile --      Diastolic BP Percentile --      Pulse Rate 05/26/23 1252 77     Resp 05/26/23 1252 18     Temp 05/26/23 1252 98.7 F (37.1 C)     Temp Source 05/26/23 1252 Oral     SpO2 05/26/23 1252 95 %     Weight --      Height --      Head Circumference --      Peak Flow --      Pain Score 05/26/23 1250 8     Pain Loc --      Pain Education --      Exclude from Growth Chart --    No data found.  Updated Vital Signs BP 134/85 (BP Location: Right Arm) Comment (BP Location): small adult  Pulse 77   Temp 98.7 F (37.1 C) (Oral)   Resp 18   LMP 01/05/2021 (Within Days) Comment: Pt has not been active in a year and pt states she's not getting periods anymore.  SpO2 95%   Visual Acuity Right Eye Distance:   Left Eye Distance:   Bilateral Distance:    Right Eye Near:   Left Eye Near:    Bilateral Near:     Physical Exam Vitals and nursing note reviewed.  Constitutional:      General: She is not in acute distress.    Appearance: She is well-developed.  HENT:     Head: Normocephalic and atraumatic.  Eyes:     Conjunctiva/sclera: Conjunctivae normal.  Cardiovascular:     Rate and Rhythm: Normal rate and regular rhythm.     Heart sounds: No murmur heard. Pulmonary:     Effort: Pulmonary effort is normal. No respiratory distress.     Breath sounds: Normal breath sounds.  Abdominal:     Palpations: Abdomen is soft.     Tenderness: There is no abdominal tenderness.  Musculoskeletal:        General: No swelling.     Cervical back: Neck supple.  Skin:    General: Skin is warm and dry.     Capillary Refill: Capillary refill takes less than 2 seconds.  Neurological:     Mental Status: She is  alert.  Psychiatric:        Mood and Affect: Mood normal.      UC Treatments / Results  Labs (all labs ordered are listed, but only abnormal results are displayed) Labs  Reviewed  CBC  BASIC METABOLIC PANEL    EKG   Radiology No results found.  Procedures Procedures (including critical care time)  Medications Ordered in UC Medications - No data to display  Initial Impression / Assessment and Plan / UC Course  I have reviewed the triage vital signs and the nursing notes.  Pertinent labs & imaging results that were available during my care of the patient were reviewed by me and considered in my medical decision making (see chart for details).     Bilateral lower extremity pain.  No edema appreciated on exam.  She does have a history of alcohol abuse.  Consider anemia.  Will prescribe gabapentin, symptoms consistent with neuropathy.  Patient request refill of Prozac.  She has been out for 5 days, will refill today.  She is also requesting a refill for permethrin.  Has a history of scabies.  No rash appreciated at this time, will provide refill. Final Clinical Impressions(s) / UC Diagnoses   Final diagnoses:  Bilateral leg pain  Medication refill     Discharge Instructions       Make appointment with primary care physician  If you develop chest pain, shortness of breath, or worsening symptoms follow up in the Emergency Department for further evaluation.    ED Prescriptions     Medication Sig Dispense Auth. Provider   FLUoxetine (PROZAC) 20 MG capsule Take 1 capsule (20 mg total) by mouth daily. 30 capsule Ward, Shanda Bumps Z, PA-C   gabapentin (NEURONTIN) 100 MG capsule Take 1 capsule (100 mg total) by mouth 3 (three) times daily. 90 capsule Ward, Tylene Fantasia, PA-C      PDMP not reviewed this encounter.   Ward, Tylene Fantasia, PA-C 05/26/23 [phone]

## 2023-05-26 NOTE — ED Triage Notes (Signed)
Complains of bilateral ankle edema and foot edema for 3-4 days.  No history of this.  Reports hands are swelling, hard to wear rings.    No new medicines, but did stop blood pressure medicine since google suggested it may be related to blood pressure medicine.  Swelling had started prior to stopping bp medicine  Patient removed socks and shoes.  Patient has areas of scabbing, patient thought related to chiggers.  Swelling stops just above ankles

## 2023-05-26 NOTE — ED Triage Notes (Signed)
Now patient admits to taking medicines some days and not others-knew she was running out of medicine and was trying to make medicines last  Timeline does not match  Reports she is out of all medicines

## 2023-07-01 ENCOUNTER — Ambulatory Visit: Payer: MEDICAID | Admitting: Family Medicine

## 2023-07-04 ENCOUNTER — Ambulatory Visit (INDEPENDENT_AMBULATORY_CARE_PROVIDER_SITE_OTHER): Payer: MEDICAID | Admitting: Family Medicine

## 2023-07-04 ENCOUNTER — Encounter: Payer: Self-pay | Admitting: Family Medicine

## 2023-07-04 VITALS — BP 140/90 | HR 79 | Temp 98.6°F | Ht 61.0 in | Wt 131.0 lb

## 2023-07-04 DIAGNOSIS — Z23 Encounter for immunization: Secondary | ICD-10-CM

## 2023-07-04 DIAGNOSIS — Z114 Encounter for screening for human immunodeficiency virus [HIV]: Secondary | ICD-10-CM

## 2023-07-04 DIAGNOSIS — F411 Generalized anxiety disorder: Secondary | ICD-10-CM | POA: Diagnosis not present

## 2023-07-04 DIAGNOSIS — I10 Essential (primary) hypertension: Secondary | ICD-10-CM | POA: Diagnosis not present

## 2023-07-04 DIAGNOSIS — Z1159 Encounter for screening for other viral diseases: Secondary | ICD-10-CM

## 2023-07-04 NOTE — Patient Instructions (Signed)
It was great to meet you today and I'm excited to have you join the [facility] practice. I hope you had a positive experience today! If you feel so inclined, please feel free to recommend our practice to friends and family. Mila Merry, FNP-C

## 2023-07-04 NOTE — Assessment & Plan Note (Signed)
Well controlled on Prozac 20mg  daily and Hydroxyzine 25mg  PRN, she is not taking the Hydroxyzine. Denies SI/HI. Continue current regimen.

## 2023-07-04 NOTE — Progress Notes (Signed)
New Patient Office Visit  Subjective    Patient ID: Kaitlin Carpenter, female    DOB: 1971-08-04  Age: 52 y.o. MRN: [id number]  CC:  Chief Complaint  Patient presents with   Establish Care    HPI BABE ANTHIS presents to establish care. Oriented to practice routines and expectations. PMH includes HTN, anxiety. Has not seen PCP in many years. Concerns include bilateral foot pain, numbness, swelling for 3 weeks. She has not been taking Amlodipine. She does report a history of lightheadedness when standing occasionally, chest pain and palpitations with anxiety and panic attacks, and swelling of bilateral feet that has subsided. These symptoms are not current or ongoing.  HYPERTENSION without Chronic Kidney Disease Hypertension status: uncontrolled  Satisfied with current treatment? no Duration of hypertension: chronic BP monitoring frequency:  not checking BP range:  BP medication side effects:  yes Medication compliance: poor compliance Previous BP meds:amlodipine Aspirin: no Recurrent headaches: no Visual changes: no Palpitations: yes Dyspnea: yes Chest pain: yes Lower extremity edema: yes Dizzy/lightheaded: yes   Breast CA screening: Mammogram status: Ordered today. Pt provided with contact info and advised to call to schedule appt.  Cervical CA screening: patient does not recall when last pap was Colon CA screening: cologuard ordered Tobacco: smoker  (1 ppd x 40 yrs), currently trying to quit using nicotine STI: declines Vaccines:  DTaP, flu today    Outpatient Encounter Medications as of 07/04/2023  Medication Sig   cyclobenzaprine (FLEXERIL) 10 MG tablet Take 1 tablet (10 mg total) by mouth 3 (three) times daily as needed for muscle spasms.   FLUoxetine (PROZAC) 20 MG capsule Take 1 capsule (20 mg total) by mouth daily.   hydrOXYzine (ATARAX) 25 MG tablet Take 1/2 - 1 full tab PO q6hrs PRN   nicotine (NICODERM CQ - DOSED IN MG/24 HOURS) 21 mg/24hr patch Place 1 patch  (21 mg total) onto the skin daily.   nicotine polacrilex (NICORETTE) 2 MG gum Take 1-2 each (2-4 mg total) by mouth as needed for smoking cessation.   senna (SENOKOT) 8.6 MG TABS tablet Take 2 tablets (17.2 mg total) by mouth daily.   [DISCONTINUED] amLODipine (NORVASC) 5 MG tablet Take 1 tablet (5 mg total) by mouth daily.   gabapentin (NEURONTIN) 100 MG capsule Take 1 capsule (100 mg total) by mouth 3 (three) times daily.   No facility-administered encounter medications on file as of 07/04/2023.    Past Medical History:  Diagnosis Date   Essential hypertension 01/23/2023    Past Surgical History:  Procedure Laterality Date   NECK SURGERY     SHOULDER SURGERY Left     Family History  Family history unknown: Yes    Social History   Socioeconomic History   Marital status: Divorced    Spouse name: Not on file   Number of children: Not on file   Years of education: Not on file   Highest education level: Not on file  Occupational History   Not on file  Tobacco Use   Smoking status: Every Day    Current packs/day: 1.00    Types: Cigarettes   Smokeless tobacco: Never  Vaping Use   Vaping status: Never Used  Substance and Sexual Activity   Alcohol use: Yes    Alcohol/week: 12.0 standard drinks of alcohol    Types: 12 Cans of beer per week   Drug use: Never   Sexual activity: Not Currently  Other Topics Concern   Not on file  Social History Narrative   Not on file   Social Determinants of Health   Financial Resource Strain: Not on file  Food Insecurity: No Food Insecurity (01/02/2023)   Hunger Vital Sign    Worried About Running Out of Food in the Last Year: Never true    Ran Out of Food in the Last Year: Never true  Transportation Needs: Unmet Transportation Needs (01/02/2023)   PRAPARE - Administrator, Civil Service (Medical): Yes    Lack of Transportation (Non-Medical): Yes  Physical Activity: Not on file  Stress: Not on file  Social Connections: Not  on file  Intimate Partner Violence: Not At Risk (01/02/2023)   Humiliation, Afraid, Rape, and Kick questionnaire    Fear of Current or Ex-Partner: No    Emotionally Abused: No    Physically Abused: No    Sexually Abused: No    Review of Systems  All other systems reviewed and are negative.       Objective    BP (!) 140/90   Pulse 79   Temp 98.6 F (37 C) (Oral)   Ht 5\' 1"  (1.549 m)   Wt 131 lb (59.4 kg)   LMP 01/05/2021 (Within Days) Comment: Pt has not been active in a year and pt states she's not getting periods anymore.  SpO2 96%   BMI 24.75 kg/m   Physical Exam Vitals and nursing note reviewed.  Constitutional:      Appearance: Normal appearance. She is normal weight.  HENT:     Head: Normocephalic and atraumatic.  Cardiovascular:     Rate and Rhythm: Normal rate and regular rhythm.     Pulses: Normal pulses.          Dorsalis pedis pulses are 2+ on the right side and 2+ on the left side.       Posterior tibial pulses are 2+ on the right side and 2+ on the left side.     Heart sounds: Normal heart sounds.  Pulmonary:     Effort: Pulmonary effort is normal.     Breath sounds: Normal breath sounds.  Musculoskeletal:     Right lower leg: No edema.     Left lower leg: No edema.  Feet:     Right foot:     Skin integrity: Skin integrity normal.     Left foot:     Skin integrity: Skin integrity normal.  Skin:    General: Skin is warm and dry.  Neurological:     General: No focal deficit present.     Mental Status: She is alert and oriented to person, place, and time. Mental status is at baseline.  Psychiatric:        Mood and Affect: Mood normal.        Behavior: Behavior normal.        Thought Content: Thought content normal.        Judgment: Judgment normal.         Assessment & Plan:   Problem List Items Addressed This Visit     GAD (generalized anxiety disorder)    Well controlled on Prozac 20mg  daily and Hydroxyzine 25mg  PRN, she is not taking  the Hydroxyzine. Denies SI/HI. Continue current regimen.      Essential hypertension - Primary    Chronic. 140/90 today in office. She recently stopped taking amlodipine due to side effects. Will obtain fasting labs and consider starting hydrochlorothiazide if BP remains elevated at next OV. She is not  monitoring at home, encouraged to monitor and bring values to her next visit. She does have a history of chest pain and shortness of  breath with panic attacks, EKG showed NSR. Recommend heart healthy diet such as Mediterranean diet with whole grains, fruits, vegetable, fish, lean meats, nuts, and olive oil. Limit salt. Encouraged moderate walking, 3-5 times/week for 30-50 minutes each session. Aim for at least 150 minutes.week. Goal should be pace of 3 miles/hours, or walking 1.5 miles in 30 minutes. Avoid tobacco products. Avoid excess alcohol. Take medications as prescribed and bring medications and blood pressure log with cuff to each office visit. Seek medical care for chest pain, palpitations, shortness of breath with exertion, dizziness/lightheadedness, vision changes, recurrent headaches, or swelling of extremities.       Relevant Orders   CBC with Differential/Platelet   COMPLETE METABOLIC PANEL WITH GFR   Lipid panel   EKG 12-Lead (Completed)   Other Visit Diagnoses     Need for hepatitis C screening test       Relevant Orders   Hepatitis C antibody   Screening for HIV (human immunodeficiency virus)       Relevant Orders   HIV Antibody (routine testing w rflx)   Need for vaccination       Relevant Orders   Flu vaccine trivalent PF, 6mos and older(Flulaval,Afluria,Fluarix,Fluzone) (Completed)   Tdap vaccine greater than or equal to 7yo IM (Completed)       Return in about 2 weeks (around 07/18/2023) for chronic follow-up with labs 1 week prior, hypertension.   Park Meo, FNP

## 2023-07-04 NOTE — Assessment & Plan Note (Signed)
Chronic. 140/90 today in office. She recently stopped taking amlodipine due to side effects. Will obtain fasting labs and consider starting hydrochlorothiazide if BP remains elevated at next OV. She is not monitoring at home, encouraged to monitor and bring values to her next visit. She does have a history of chest pain and shortness of  breath with panic attacks, EKG showed NSR. Recommend heart healthy diet such as Mediterranean diet with whole grains, fruits, vegetable, fish, lean meats, nuts, and olive oil. Limit salt. Encouraged moderate walking, 3-5 times/week for 30-50 minutes each session. Aim for at least 150 minutes.week. Goal should be pace of 3 miles/hours, or walking 1.5 miles in 30 minutes. Avoid tobacco products. Avoid excess alcohol. Take medications as prescribed and bring medications and blood pressure log with cuff to each office visit. Seek medical care for chest pain, palpitations, shortness of breath with exertion, dizziness/lightheadedness, vision changes, recurrent headaches, or swelling of extremities.

## 2023-07-09 ENCOUNTER — Telehealth: Payer: Self-pay | Admitting: Family Medicine

## 2023-07-09 ENCOUNTER — Other Ambulatory Visit: Payer: Self-pay | Admitting: Family Medicine

## 2023-07-09 DIAGNOSIS — F411 Generalized anxiety disorder: Secondary | ICD-10-CM

## 2023-07-09 DIAGNOSIS — K5900 Constipation, unspecified: Secondary | ICD-10-CM

## 2023-07-09 DIAGNOSIS — M5441 Lumbago with sciatica, right side: Secondary | ICD-10-CM

## 2023-07-09 MED ORDER — GABAPENTIN 100 MG PO CAPS: 100.0000 mg | ORAL_CAPSULE | Freq: Three times a day (TID) | ORAL | 0 refills | Status: DC

## 2023-07-09 MED ORDER — FLUOXETINE HCL 20 MG PO CAPS
20.0000 mg | ORAL_CAPSULE | Freq: Every day | ORAL | 2 refills | Status: DC
Start: 2023-07-09 — End: 2023-10-10

## 2023-07-09 MED ORDER — CYCLOBENZAPRINE HCL 10 MG PO TABS
10.0000 mg | ORAL_TABLET | Freq: Three times a day (TID) | ORAL | 0 refills | Status: DC | PRN
Start: 2023-07-09 — End: 2023-09-03

## 2023-07-09 MED ORDER — HYDROXYZINE HCL 25 MG PO TABS: ORAL_TABLET | ORAL | 1 refills | Status: DC

## 2023-07-09 MED ORDER — SENNA 8.6 MG PO TABS: 2.0000 | ORAL_TABLET | Freq: Every day | ORAL | 1 refills | Status: AC
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
## 2023-07-10 NOTE — Telephone Encounter (Signed)
Called and LVM reg med request refill. Refill has already been sent to pt's pharmacy

## 2023-07-12 ENCOUNTER — Other Ambulatory Visit: Payer: MEDICAID

## 2023-07-18 ENCOUNTER — Ambulatory Visit: Payer: MEDICAID | Admitting: Family Medicine

## 2023-07-29 ENCOUNTER — Other Ambulatory Visit: Payer: MEDICAID

## 2023-07-31 ENCOUNTER — Encounter: Payer: Self-pay | Admitting: Family Medicine

## 2023-07-31 ENCOUNTER — Ambulatory Visit: Payer: MEDICAID | Admitting: Family Medicine

## 2023-07-31 ENCOUNTER — Ambulatory Visit: Payer: MEDICAID | Attending: Family Medicine

## 2023-07-31 VITALS — BP 118/62 | HR 79 | Temp 98.2°F | Ht 61.0 in | Wt 127.0 lb

## 2023-07-31 DIAGNOSIS — R079 Chest pain, unspecified: Secondary | ICD-10-CM | POA: Insufficient documentation

## 2023-07-31 DIAGNOSIS — F17210 Nicotine dependence, cigarettes, uncomplicated: Secondary | ICD-10-CM | POA: Diagnosis not present

## 2023-07-31 DIAGNOSIS — I10 Essential (primary) hypertension: Secondary | ICD-10-CM | POA: Diagnosis not present

## 2023-07-31 DIAGNOSIS — Z1231 Encounter for screening mammogram for malignant neoplasm of breast: Secondary | ICD-10-CM

## 2023-07-31 DIAGNOSIS — Z23 Encounter for immunization: Secondary | ICD-10-CM

## 2023-07-31 DIAGNOSIS — Z1211 Encounter for screening for malignant neoplasm of colon: Secondary | ICD-10-CM

## 2023-07-31 NOTE — Progress Notes (Unsigned)
 Enrolled patient for a 7 day Zio XT monitor to be mailed to patients home  EP to read.

## 2023-07-31 NOTE — Assessment & Plan Note (Addendum)
EKG NSR. BP WNL. No symptoms on exam today. Will order long term monitor and also refer to cardiology for further working. Encouraged to seek medical care immediately if these symptoms occur again.  Follow up in 2 week.s

## 2023-07-31 NOTE — Progress Notes (Signed)
Subjective:  HPI: Kaitlin Carpenter is a 52 y.o. female presenting on 07/31/2023 for No chief complaint on file.   HPI Patient is in today for blood pressure follow-up. She has not been monitoring her BP at home but it is improved today from her initial visit with me. Today she does report palpitations and chest pain on Saturday when she was exerting herself cleaning a home, endorses irregular pulse, dizziness, and shortness of breath. Symptoms were resolved with rest for 45 minutes and she reports she did not seek medical care.  This has not happened otherwise in the past 6 months. Today she denies chest pain, palpitations, shortness of breath, vision changes, recurrent headaches. She is using nicotine patches to help her quit smoking. She has not been able to return to the office for labs as she has been out of town, is also not fasting today.    Review of Systems  All other systems reviewed and are negative.   Relevant past medical history reviewed and updated as indicated.   Past Medical History:  Diagnosis Date   Essential hypertension 01/23/2023     Past Surgical History:  Procedure Laterality Date   NECK SURGERY     SHOULDER SURGERY Left     Allergies and medications reviewed and updated.   Current Outpatient Medications:    cyclobenzaprine (FLEXERIL) 10 MG tablet, Take 1 tablet (10 mg total) by mouth 3 (three) times daily as needed for muscle spasms., Disp: 30 tablet, Rfl: 0   FLUoxetine (PROZAC) 20 MG capsule, Take 1 capsule (20 mg total) by mouth daily., Disp: 30 capsule, Rfl: 2   gabapentin (NEURONTIN) 100 MG capsule, Take 1 capsule (100 mg total) by mouth 3 (three) times daily., Disp: 90 capsule, Rfl: 0   hydrOXYzine (ATARAX) 25 MG tablet, Take 1/2 - 1 full tab PO q6hrs PRN, Disp: 60 tablet, Rfl: 1   nicotine (NICODERM CQ - DOSED IN MG/24 HOURS) 21 mg/24hr patch, Place 1 patch (21 mg total) onto the skin daily., Disp: 28 patch, Rfl: 1   nicotine polacrilex (NICORETTE)  2 MG gum, Take 1-2 each (2-4 mg total) by mouth as needed for smoking cessation., Disp: 100 tablet, Rfl: 0   senna (SENOKOT) 8.6 MG TABS tablet, Take 2 tablets (17.2 mg total) by mouth daily., Disp: 60 tablet, Rfl: 1  No Known Allergies  Objective:   BP 118/62   Pulse 79   Temp 98.2 F (36.8 C) (Oral)   Ht 5\' 1"  (1.549 m)   Wt 127 lb (57.6 kg)   LMP 01/05/2021 (Within Days) Comment: Pt has not been active in a year and pt states she's not getting periods anymore.  SpO2 97%   BMI 24.00 kg/m      07/31/2023   10:23 AM 07/04/2023   11:37 AM 07/04/2023   11:31 AM  Vitals with BMI  Height 5\' 1"   5\' 1"   Weight 127 lbs  131 lbs  BMI 24.01  24.76  Systolic 118 140 962  Diastolic 62 90 90  Pulse 79  79     Physical Exam Vitals and nursing note reviewed.  Constitutional:      Appearance: Normal appearance. She is normal weight.  HENT:     Head: Normocephalic and atraumatic.  Cardiovascular:     Rate and Rhythm: Normal rate and regular rhythm.     Pulses: Normal pulses.     Heart sounds: Normal heart sounds.  Pulmonary:     Effort: Pulmonary effort  is normal.     Breath sounds: Normal breath sounds.  Skin:    General: Skin is warm and dry.  Neurological:     General: No focal deficit present.     Mental Status: She is alert and oriented to person, place, and time. Mental status is at baseline.  Psychiatric:        Mood and Affect: Mood normal.        Behavior: Behavior normal.        Thought Content: Thought content normal.        Judgment: Judgment normal.     Assessment & Plan:  Essential hypertension Assessment & Plan: Chronic. Well controlled today in office. Remains off Amlodipine. She is not monitoring at home, encouraged to monitor and bring values to her next visit. Recent episode of chest pain with reported irregular pulse several days ago. Will refer to cardiology and order zio patch, encouraged to seek medical care immediately for these symptoms. Will obtain  fasting labs. Recommend heart healthy diet such as Mediterranean diet with whole grains, fruits, vegetable, fish, lean meats, nuts, and olive oil. Limit salt. Encouraged moderate walking, 3-5 times/week for 30-50 minutes each session. Aim for at least 150 minutes.week. Goal should be pace of 3 miles/hours, or walking 1.5 miles in 30 minutes. Avoid tobacco products. Avoid excess alcohol. Take medications as prescribed and bring medications and blood pressure log with cuff to each office visit. Seek medical care for chest pain, palpitations, shortness of breath with exertion, dizziness/lightheadedness, vision changes, recurrent headaches, or swelling of extremities. Follow up in 2 weeks    Orders: -     EKG 12-Lead  Chest pain, unspecified type Assessment & Plan: EKG NSR. BP WNL. No symptoms on exam today. Will order long term monitor and also refer to cardiology for further working. Encouraged to seek medical care immediately if these symptoms occur again.  Follow up in 2 week.s  Orders: -     EKG 12-Lead -     LONG TERM MONITOR (3-14 DAYS); Future -     Ambulatory referral to Cardiology  Encounter for screening mammogram for malignant neoplasm of breast -     Digital Screening Mammogram, Left and Right; Future  Colon cancer screening -     Cologuard  Cigarette nicotine dependence without complication -     Ambulatory Referral for Lung Cancer Scre  Need for vaccination -     Varicella-zoster vaccine IM     Follow up plan: Return in about 2 weeks (around 08/14/2023) for annual physical/PAP.  Park Meo, FNP

## 2023-07-31 NOTE — Assessment & Plan Note (Addendum)
Chronic. Well controlled today in office. Remains off Amlodipine. She is not monitoring at home, encouraged to monitor and bring values to her next visit. Recent episode of chest pain with reported irregular pulse several days ago. Will refer to cardiology and order zio patch, encouraged to seek medical care immediately for these symptoms. Will obtain fasting labs. Recommend heart healthy diet such as Mediterranean diet with whole grains, fruits, vegetable, fish, lean meats, nuts, and olive oil. Limit salt. Encouraged moderate walking, 3-5 times/week for 30-50 minutes each session. Aim for at least 150 minutes.week. Goal should be pace of 3 miles/hours, or walking 1.5 miles in 30 minutes. Avoid tobacco products. Avoid excess alcohol. Take medications as prescribed and bring medications and blood pressure log with cuff to each office visit. Seek medical care for chest pain, palpitations, shortness of breath with exertion, dizziness/lightheadedness, vision changes, recurrent headaches, or swelling of extremities. Follow up in 2 weeks

## 2023-08-08 ENCOUNTER — Encounter: Payer: Self-pay | Admitting: Family Medicine

## 2023-08-15 DIAGNOSIS — R079 Chest pain, unspecified: Secondary | ICD-10-CM

## 2023-08-15 DIAGNOSIS — R002 Palpitations: Secondary | ICD-10-CM | POA: Diagnosis not present

## 2023-08-21 ENCOUNTER — Inpatient Hospital Stay (HOSPITAL_COMMUNITY): Admission: RE | Admit: 2023-08-21 | Payer: MEDICAID | Source: Ambulatory Visit

## 2023-08-22 ENCOUNTER — Other Ambulatory Visit: Payer: MEDICAID

## 2023-08-26 ENCOUNTER — Other Ambulatory Visit: Payer: MEDICAID

## 2023-08-31 LAB — TEST AUTHORIZATION

## 2023-08-31 LAB — CBC WITH DIFFERENTIAL/PLATELET
Absolute Lymphocytes: 3666 {cells}/uL (ref 850–3900)
Absolute Monocytes: 1185 {cells}/uL — ABNORMAL HIGH (ref 200–950)
Basophils Absolute: 103 {cells}/uL (ref 0–200)
Basophils Relative: 1.3 %
Eosinophils Absolute: 403 {cells}/uL (ref 15–500)
Eosinophils Relative: 5.1 %
HCT: 44.7 % (ref 35.0–45.0)
Hemoglobin: 15 g/dL (ref 11.7–15.5)
MCH: 35.5 pg — ABNORMAL HIGH (ref 27.0–33.0)
MCHC: 33.6 g/dL (ref 32.0–36.0)
MCV: 105.7 fL — ABNORMAL HIGH (ref 80.0–100.0)
MPV: 10 fL (ref 7.5–12.5)
Monocytes Relative: 15 %
Neutro Abs: 2544 {cells}/uL (ref 1500–7800)
Neutrophils Relative %: 32.2 %
Platelets: 435 10*3/uL — ABNORMAL HIGH (ref 140–400)
RBC: 4.23 10*6/uL (ref 3.80–5.10)
RDW: 14.7 % (ref 11.0–15.0)
Total Lymphocyte: 46.4 %
WBC: 7.9 10*3/uL (ref 3.8–10.8)

## 2023-08-31 LAB — COMPLETE METABOLIC PANEL WITH GFR
AG Ratio: 1.2 (calc) (ref 1.0–2.5)
ALT: 76 U/L — ABNORMAL HIGH (ref 6–29)
AST: 94 U/L — ABNORMAL HIGH (ref 10–35)
Albumin: 4.8 g/dL (ref 3.6–5.1)
Alkaline phosphatase (APISO): 65 U/L (ref 37–153)
BUN: 10 mg/dL (ref 7–25)
CO2: 27 mmol/L (ref 20–32)
Calcium: 9.4 mg/dL (ref 8.6–10.4)
Chloride: 100 mmol/L (ref 98–110)
Creat: 0.74 mg/dL (ref 0.50–1.03)
Globulin: 4.1 g/dL — ABNORMAL HIGH (ref 1.9–3.7)
Glucose, Bld: 99 mg/dL (ref 65–99)
Potassium: 3.9 mmol/L (ref 3.5–5.3)
Sodium: 140 mmol/L (ref 135–146)
Total Bilirubin: 0.2 mg/dL (ref 0.2–1.2)
Total Protein: 8.9 g/dL — ABNORMAL HIGH (ref 6.1–8.1)
eGFR: 97 mL/min/{1.73_m2} (ref >=60)

## 2023-08-31 LAB — HEPATITIS C RNA QUANTITATIVE
HCV Quantitative Log: 5.51 {Log_IU}/mL — ABNORMAL HIGH
HCV RNA, PCR, QN: 326000 [IU]/mL — ABNORMAL HIGH

## 2023-08-31 LAB — HEPATITIS C ANTIBODY: Hepatitis C Ab: REACTIVE — AB

## 2023-08-31 LAB — HEPATITIS B SURFACE ANTIBODY, QUANTITATIVE: Hep B S AB Quant (Post): <5 m[IU]/mL — ABNORMAL LOW (ref >=10)

## 2023-08-31 LAB — LIPID PANEL
Cholesterol: 173 mg/dL (ref <200)
HDL: 45 mg/dL — ABNORMAL LOW (ref >=50)
LDL Cholesterol (Calc): 92 mg/dL
Non-HDL Cholesterol (Calc): 128 mg/dL (ref <130)
Total CHOL/HDL Ratio: 3.8 (calc) (ref <5.0)
Triglycerides: 295 mg/dL — ABNORMAL HIGH (ref <150)

## 2023-08-31 LAB — IRON, TOTAL/TOTAL IRON BINDING CAP
%SAT: 34 % (ref 16–45)
Iron: 153 ug/dL (ref 45–160)
TIBC: 445 ug/dL (ref 250–450)

## 2023-08-31 LAB — HIV ANTIBODY (ROUTINE TESTING W REFLEX): HIV 1&2 Ab, 4th Generation: NONREACTIVE

## 2023-09-02 ENCOUNTER — Other Ambulatory Visit: Payer: Self-pay | Admitting: Family Medicine

## 2023-09-02 ENCOUNTER — Telehealth: Payer: Self-pay

## 2023-09-02 NOTE — Telephone Encounter (Signed)
Copied from CRM [id number]. Topic: Clinical - Medication Refill >> Sep 02, 2023  1:27 PM Theodis Sato wrote: Most Recent Primary Care Visit:  Provider: [facility]  Department: [facility]  Visit Type: LAB  Date: 08/26/2023  Medication: nicotine polacrilex (NICORETTE) 2 MG gum  cyclobenzaprine (FLEXERIL) 10 MG tablet gabapentin (NEURONTIN) 100 MG capsule     Has the patient contacted their pharmacy? No, she's out of re-fills on these medications. (Agent: If no, request that the patient contact the pharmacy for the refill. If patient does not wish to contact the pharmacy document the reason why and proceed with request.) (Agent: If yes, when and what did the pharmacy advise?)  Is this the correct pharmacy for this prescription? Yes If no, delete pharmacy and type the correct one.  This is the patient's preferred pharmacy:  [id number] [place], [place] - [address] [place] [address] Phone: [phone] Fax: [phone]   Has the prescription been filled recently? Yes  Is the patient out of the medication? Yes  Has the patient been seen for an appointment in the last year OR does the patient have an upcoming appointment? Yes  Can we respond through MyChart? No  Agent: Please be advised that Rx refills may take up to 3 business days. We ask that you follow-up with your pharmacy.

## 2023-09-03 ENCOUNTER — Ambulatory Visit: Payer: MEDICAID | Admitting: Family Medicine

## 2023-09-03 ENCOUNTER — Encounter: Payer: Self-pay | Admitting: Family Medicine

## 2023-09-03 VITALS — BP 114/62 | HR 74 | Temp 98.6°F | Ht 61.0 in | Wt 125.4 lb

## 2023-09-03 DIAGNOSIS — K739 Chronic hepatitis, unspecified: Secondary | ICD-10-CM | POA: Diagnosis not present

## 2023-09-03 DIAGNOSIS — Z124 Encounter for screening for malignant neoplasm of cervix: Secondary | ICD-10-CM | POA: Diagnosis not present

## 2023-09-03 DIAGNOSIS — Z0001 Encounter for general adult medical examination with abnormal findings: Secondary | ICD-10-CM | POA: Diagnosis not present

## 2023-09-03 DIAGNOSIS — M5441 Lumbago with sciatica, right side: Secondary | ICD-10-CM | POA: Diagnosis not present

## 2023-09-03 DIAGNOSIS — Z0000 Encounter for general adult medical examination without abnormal findings: Secondary | ICD-10-CM

## 2023-09-03 DIAGNOSIS — Z113 Encounter for screening for infections with a predominantly sexual mode of transmission: Secondary | ICD-10-CM

## 2023-09-03 MED ORDER — CYCLOBENZAPRINE HCL 10 MG PO TABS
10.0000 mg | ORAL_TABLET | Freq: Three times a day (TID) | ORAL | 0 refills | Status: DC | PRN
Start: 2023-09-03 — End: 2023-10-10
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
## 2023-09-06 LAB — C. TRACHOMATIS/N. GONORRHOEAE RNA
C. trachomatis RNA, TMA: NOT DETECTED
N. gonorrhoeae RNA, TMA: NOT DETECTED

## 2023-09-06 LAB — PAP, TP IMAGING W/ HPV RNA, RFLX HPV TYPE 16,18/45: HPV DNA High Risk: NOT DETECTED

## 2023-09-06 LAB — PAP, TP IMAGING W/ CT/GC AND W/ HPV RNA, RFLX HPV TYPE 16/18

## 2023-09-10 ENCOUNTER — Other Ambulatory Visit: Payer: Self-pay

## 2023-09-10 DIAGNOSIS — F411 Generalized anxiety disorder: Secondary | ICD-10-CM

## 2023-09-10 DIAGNOSIS — M5441 Lumbago with sciatica, right side: Secondary | ICD-10-CM

## 2023-09-10 NOTE — Telephone Encounter (Signed)
Rec' fax from [facility] re: pt's information fax to them.   I faxed most recent chart notes, labs, referral, Pt inform form

## 2023-09-11 ENCOUNTER — Ambulatory Visit: Payer: MEDICAID | Admitting: Family Medicine

## 2023-09-12 ENCOUNTER — Other Ambulatory Visit: Payer: Self-pay

## 2023-09-12 DIAGNOSIS — Z87891 Personal history of nicotine dependence: Secondary | ICD-10-CM

## 2023-09-12 DIAGNOSIS — F17210 Nicotine dependence, cigarettes, uncomplicated: Secondary | ICD-10-CM

## 2023-09-12 DIAGNOSIS — Z122 Encounter for screening for malignant neoplasm of respiratory organs: Secondary | ICD-10-CM

## 2023-09-16 ENCOUNTER — Encounter: Payer: Self-pay | Admitting: Physician Assistant

## 2023-09-16 ENCOUNTER — Ambulatory Visit: Payer: MEDICAID | Attending: Cardiovascular Disease | Admitting: Cardiovascular Disease

## 2023-09-16 ENCOUNTER — Ambulatory Visit (INDEPENDENT_AMBULATORY_CARE_PROVIDER_SITE_OTHER): Payer: 59 | Admitting: Physician Assistant

## 2023-09-16 DIAGNOSIS — F17210 Nicotine dependence, cigarettes, uncomplicated: Secondary | ICD-10-CM

## 2023-09-16 NOTE — Patient Instructions (Signed)

## 2023-09-16 NOTE — Progress Notes (Signed)
Virtual Visit via Telephone Note  I connected with Kaitlin Carpenter on 09/16/23 at  9:56 AM  by telephone and verified that I am speaking with the correct person using two identifiers.  Location: Patient: home Provider: working virtually from home   I discussed the limitations, risks, security and privacy concerns of performing an evaluation and management service by telephone and the availability of in person appointments. I also discussed with the patient that there may be a patient responsible charge related to this service. The patient expressed understanding and agreed to proceed.     Shared Decision Making Visit Lung Cancer Screening Program [phone])   Eligibility: Age 52 Pack Years Smoking History Calculation 72 (# packs/per year x # years smoked) Recent History of coughing up blood  No Unexplained weight loss? No ( >Than 15 pounds within the last 6 months ) Prior History Lung / other cancer No (Diagnosis within the last 5 years already requiring surveillance chest CT Scans). Smoking Status Current Smoker  Visit Components: Discussion included one or more decision making aids. Yes Discussion included risk/benefits of screening. Yes Discussion included potential follow up diagnostic testing for abnormal scans. Yes Discussion included meaning and risk of over diagnosis. Yes Discussion included meaning and risk of False Positives. Yes Discussion included meaning of total radiation exposure. Yes  Counseling Included: Importance of adherence to annual lung cancer LDCT screening. Yes Impact of comorbidities on ability to participate in the program. Yes Ability and willingness to under diagnostic treatment: Yes  Smoking Cessation Counseling: Current Smokers:  Discussed importance of smoking cessation. Yes Information about tobacco cessation classes and interventions provided to patient. Yes Symptomatic Patient. No Diagnosis Code: Tobacco Use Z72.0 Asymptomatic Patient  Yes  Counseling (Intermediate counseling: > three minutes counseling) U0454 Information about tobacco cessation classes and interventions provided to patient. Yes Written Order for Lung Cancer Screening with LDCT placed in Epic. Yes (CT Chest Lung Cancer Screening Low Dose W/O CM) [id number] Z12.2-Screening of respiratory organs Z87.891-Personal history of nicotine dependence   I have spent 25 minutes of face to face/ virtual visit  time with the patient discussing the risks and benefits of lung cancer screening. We discussed the above noted topics. We paused at intervals to allow for questions to be asked and answered to ensure understanding.We discussed that the single most powerful action that anyone can take to decrease their risk of developing lung cancer is to quit smoking.  We discussed options for tools to aid in quitting smoking including nicotine replacement therapy, non-nicotine medications, support groups, Quit Smart classes, and behavior modification. We discussed that often times setting smaller, more achievable goals, such as eliminating 1 cigarette a day for a week and then 2 cigarettes a day for a week can be helpful in slowly decreasing the number of cigarettes smoked. I provided  them  with smoking cessation  information  with contact information for community resources, classes, free nicotine replacement therapy, and access to mobile apps, text messaging, and on-line smoking cessation help. I have also provided  them  the office contact information in the event they have any questions. We discussed the time and location of the scan, and that either Abigail Miyamoto RN, Karlton Lemon, RN  or I will call / send a letter with the results within 24-72 hours of receiving them. The patient verbalized understanding of all of  the above and had no further questions upon leaving the office. They have my contact information in the event they have any  further questions.  I spent 3 minutes counseling  on smoking cessation and the health risks of continued tobacco abuse.  I explained to the patient that there has been a high incidence of coronary artery disease noted on these exams. I explained that this is a non-gated exam therefore degree or severity cannot be determined. This patient is on statin therapy. I have asked the patient to follow-up with their PCP regarding any incidental finding of coronary artery disease and management with diet or medication as their PCP  feels is clinically indicated. The patient verbalized understanding of the above and had no further questions upon completion of the visit.    Darcella Gasman Jamya Starry, PA-C

## 2023-09-17 ENCOUNTER — Encounter: Payer: Self-pay | Admitting: Cardiovascular Disease

## 2023-09-26 ENCOUNTER — Ambulatory Visit: Payer: 59

## 2023-09-27 ENCOUNTER — Ambulatory Visit (HOSPITAL_COMMUNITY): Payer: MEDICAID

## 2023-10-09 ENCOUNTER — Other Ambulatory Visit: Payer: Self-pay

## 2023-10-09 DIAGNOSIS — F411 Generalized anxiety disorder: Secondary | ICD-10-CM

## 2023-10-09 NOTE — Telephone Encounter (Signed)
 Copied from CRM [id number]. Topic: Clinical - Medication Refill >> Oct 09, 2023  2:54 PM Zacyrah J wrote: Most Recent Primary Care Visit:  Provider: KAYLA GAUZE S  Department: [facility]  Visit Type: PHYSICAL  Date: 09/03/2023  Medication: FLUoxetine  (PROZAC ) 20 MG capsule  hydrOXYzine  (ATARAX ) 25 MG tablet   cyclobenzaprine  (FLEXERIL ) 10 MG tablet    Has the patient contacted their pharmacy? Yes (Agent: If no, request that the patient contact the pharmacy for the refill. If patient does not wish to contact the pharmacy document the reason why and proceed with request.) (Agent: If yes, when and what did the pharmacy advise?)  Is this the correct pharmacy for this prescription? Yes If no, delete pharmacy and type the correct one.  This is the patient's preferred pharmacy:  [id number] [place], [place] - [address] [place] [address] Phone: [phone] Fax: [phone]   Has the prescription been filled recently? No  Is the patient out of the medication? Yes  Has the patient been seen for an appointment in the last year OR does the patient have an upcoming appointment? Yes  Can we respond through MyChart? Yes  Agent: Please be advised that Rx refills may take up to 3 business days. We ask that you follow-up with your pharmacy.

## 2023-10-09 NOTE — Telephone Encounter (Signed)
 Med refill

## 2023-10-10 ENCOUNTER — Other Ambulatory Visit: Payer: Self-pay | Admitting: Family Medicine

## 2023-10-10 ENCOUNTER — Ambulatory Visit (INDEPENDENT_AMBULATORY_CARE_PROVIDER_SITE_OTHER): Payer: 59 | Admitting: Family Medicine

## 2023-10-10 ENCOUNTER — Encounter: Payer: Self-pay | Admitting: Family Medicine

## 2023-10-10 VITALS — BP 118/70 | HR 73 | Temp 98.0°F | Ht 61.0 in | Wt 130.0 lb

## 2023-10-10 DIAGNOSIS — M5441 Lumbago with sciatica, right side: Secondary | ICD-10-CM

## 2023-10-10 DIAGNOSIS — L989 Disorder of the skin and subcutaneous tissue, unspecified: Secondary | ICD-10-CM | POA: Insufficient documentation

## 2023-10-10 DIAGNOSIS — Z23 Encounter for immunization: Secondary | ICD-10-CM

## 2023-10-10 DIAGNOSIS — F411 Generalized anxiety disorder: Secondary | ICD-10-CM

## 2023-10-10 DIAGNOSIS — Z1211 Encounter for screening for malignant neoplasm of colon: Secondary | ICD-10-CM

## 2023-10-10 MED ORDER — FLUOXETINE HCL 20 MG PO CAPS: 20.0000 mg | ORAL_CAPSULE | Freq: Every day | ORAL | 2 refills | Status: AC

## 2023-10-10 MED ORDER — HYDROXYZINE HCL 25 MG PO TABS: ORAL_TABLET | ORAL | 1 refills | Status: DC

## 2023-10-10 MED ORDER — CYCLOBENZAPRINE HCL 10 MG PO TABS: 10.0000 mg | ORAL_TABLET | Freq: Three times a day (TID) | ORAL | 0 refills | Status: DC | PRN

## 2023-10-10 NOTE — Addendum Note (Signed)
 Addended by: Arta Silence on: 10/10/2023 12:15 PM   Modules accepted: Orders

## 2023-10-10 NOTE — Assessment & Plan Note (Signed)
 Refills provided

## 2023-10-10 NOTE — Progress Notes (Signed)
 Subjective:  HPI: Kaitlin Carpenter is a 53 y.o. female presenting on 10/10/2023 for Follow-up (molds on back, there are getting big and they are dry/)   HPI Patient is in today for concerning moles on her left flank and back. These are new to  her. She reports dry crusting, denies redness or drainage. No history of skin cancer. She does report spending a lot of time in the sun and in tanning beds throughout her life. These are not painful or itching.  Review of Systems  All other systems reviewed and are negative.   Relevant past medical history reviewed and updated as indicated.   Past Medical History:  Diagnosis Date   Essential hypertension 01/23/2023     Past Surgical History:  Procedure Laterality Date   NECK SURGERY     SHOULDER SURGERY Left     Allergies and medications reviewed and updated.   Current Outpatient Medications:    gabapentin  (NEURONTIN ) 100 MG capsule, TAKE 1 CAPSULE (100 MG TOTAL) BY MOUTH THREE TIMES DAILY., Disp: 90 capsule, Rfl: 0   nicotine  (NICODERM CQ  - DOSED IN MG/24 HOURS) 21 mg/24hr patch, Place 1 patch (21 mg total) onto the skin daily., Disp: 28 patch, Rfl: 1   nicotine  polacrilex (NICORETTE ) 2 MG gum, Take 1-2 each (2-4 mg total) by mouth as needed for smoking cessation., Disp: 100 tablet, Rfl: 0   senna (SENOKOT) 8.6 MG TABS tablet, Take 2 tablets (17.2 mg total) by mouth daily., Disp: 60 tablet, Rfl: 1   cyclobenzaprine  (FLEXERIL ) 10 MG tablet, Take 1 tablet (10 mg total) by mouth 3 (three) times daily as needed for muscle spasms., Disp: 30 tablet, Rfl: 0   FLUoxetine  (PROZAC ) 20 MG capsule, Take 1 capsule (20 mg total) by mouth daily., Disp: 30 capsule, Rfl: 2   hydrOXYzine  (ATARAX ) 25 MG tablet, Take 1/2 - 1 full tab PO q6hrs PRN, Disp: 60 tablet, Rfl: 1  No Known Allergies  Objective:   BP 118/70   Pulse 73   Temp 98 F (36.7 C) (Oral)   Ht 5' 1 (1.549 m)   Wt 130 lb (59 kg)   LMP 01/05/2021 (Within Days) Comment: Pt has not been  active in a year and pt states she's not getting periods anymore.  SpO2 93%   BMI 24.56 kg/m      10/10/2023   10:23 AM 09/03/2023    4:08 PM 07/31/2023   10:23 AM  Vitals with BMI  Height 5' 1 5' 1 5' 1  Weight 130 lbs 125 lbs 6 oz 127 lbs  BMI 24.58 23.7 24.01  Systolic 118 114 881  Diastolic 70 62 62  Pulse 73 74 79     Physical Exam Vitals and nursing note reviewed.  Constitutional:      Appearance: Normal appearance. She is normal weight.  HENT:     Head: Normocephalic and atraumatic.  Skin:    General: Skin is warm and dry.     Findings: Lesion and rash present. Rash is crusting.       Neurological:     General: No focal deficit present.     Mental Status: She is alert and oriented to person, place, and time. Mental status is at baseline.  Psychiatric:        Mood and Affect: Mood normal.        Behavior: Behavior normal.        Thought Content: Thought content normal.  Judgment: Judgment normal.     Assessment & Plan:  Skin lesion Assessment & Plan: Patient presents today with 2 concerning lesions to her left side with white plaque appearance, they are irregularly shaped. I am concerned so cutaneous carcinoma and believe these should be biopsied. I offered shave biopsy today and she would like referral to dermatology. Urgent referral placed.   Orders: -     Ambulatory referral to Dermatology  GAD (generalized anxiety disorder) Assessment & Plan: Refills provided  Orders: -     FLUoxetine  HCl; Take 1 capsule (20 mg total) by mouth daily.  Dispense: 30 capsule; Refill: 2 -     hydrOXYzine  HCl; Take 1/2 - 1 full tab PO q6hrs PRN  Dispense: 60 tablet; Refill: 1  Acute right-sided low back pain with right-sided sciatica Assessment & Plan: Chronic low back pain that is exacerbated occasionally with work and heavy lifting described as spasms. This is at her baseline with no new or worsening symptoms. She requests Flexeril  refill for occasional use.    Orders: -     Cyclobenzaprine  HCl; Take 1 tablet (10 mg total) by mouth 3 (three) times daily as needed for muscle spasms.  Dispense: 30 tablet; Refill: 0  Colon cancer screening -     Cologuard     Follow up plan: Return in about 6 months (around 04/08/2024) for chronic follow-up with labs 1 week prior.  Jeoffrey GORMAN Barrio, FNP

## 2023-10-10 NOTE — Assessment & Plan Note (Signed)
 Patient presents today with 2 concerning lesions to her left side with white plaque appearance, they are irregularly shaped. I am concerned so cutaneous carcinoma and believe these should be biopsied. I offered shave biopsy today and she would like referral to dermatology. Urgent referral placed.

## 2023-10-10 NOTE — Assessment & Plan Note (Signed)
 Chronic low back pain that is exacerbated occasionally with work and heavy lifting described as spasms. This is at her baseline with no new or worsening symptoms. She requests Flexeril refill for occasional use.

## 2023-11-13 ENCOUNTER — Ambulatory Visit: Payer: 59 | Admitting: Dermatology

## 2023-12-23 ENCOUNTER — Other Ambulatory Visit: Payer: Self-pay | Admitting: Family Medicine

## 2023-12-23 ENCOUNTER — Telehealth: Payer: Self-pay

## 2023-12-23 NOTE — Telephone Encounter (Signed)
 Copied from CRM [id number]. Topic: General - Other >> Dec 23, 2023 12:25 PM Fredrich Romans wrote: Reason for CRM: Patient called in stating that she is trying to get into a alcohol rehab facility and they had to check her b/p at her initial assessment and it was high.She stated that Amber took her off of b/p medication due to her not really needing to be on it and the side effects. She would like to know if a letter could be given to her by [facility] stating that her b/p is not high enough for her to be placed back on any medication.  [phone] cb #

## 2023-12-24 ENCOUNTER — Other Ambulatory Visit: Payer: Self-pay | Admitting: Family Medicine

## 2023-12-24 ENCOUNTER — Ambulatory Visit

## 2023-12-24 DIAGNOSIS — F411 Generalized anxiety disorder: Secondary | ICD-10-CM

## 2023-12-24 NOTE — Telephone Encounter (Unsigned)
 Copied from CRM [id number]. Topic: Clinical - Medication Refill >> Dec 23, 2023 12:21 PM Fredrich Romans wrote: Most Recent Primary Care Visit:  Provider: Kurtis Bushman S  Department: [facility]  Visit Type: OFFICE VISIT  Date: 10/10/2023  Medication: gabapentin (NEURONTIN) 100 MG capsule  Has the patient contacted their pharmacy? No (Agent: If no, request that the patient contact the pharmacy for the refill. If patient does not wish to contact the pharmacy document the reason why and proceed with request.) (Agent: If yes, when and what did the pharmacy advise?)  Is this the correct pharmacy for this prescription? Yes If no, delete pharmacy and type the correct one.  This is the patient's preferred pharmacy:  [id number] [place], [place] - [address] [place] [address] Phone: [phone] Fax: [phone]   Has the prescription been filled recently? No  Is the patient out of the medication? Yes  Has the patient been seen for an appointment in the last year OR does the patient have an upcoming appointment? Yes  Can we respond through MyChart? No  Agent: Please be advised that Rx refills may take up to 3 business days. We ask that you follow-up with your pharmacy. >> Dec 24, 2023  2:40 PM Abundio Miu S wrote: Patient is calling back to check the status of medication refill. Advised that refill request can take up to 3 business days. Patient states she needs refill due to her entering rehab

## 2023-12-24 NOTE — Telephone Encounter (Signed)
 Copied from CRM [id number]. Topic: Clinical - Medication Refill >> Dec 24, 2023  2:40 PM Izetta Dakin wrote: Most Recent Primary Care Visit:  Provider: Renee Pain  Department: [facility]  Visit Type: CLINICAL SUPPORT  Date: 12/24/2023  Medication: hydrOXYzine (ATARAX) 25 MG tablet  Has the patient contacted their pharmacy? Yes (Agent: If no, request that the patient contact the pharmacy for the refill. If patient does not wish to contact the pharmacy document the reason why and proceed with request.) (Agent: If yes, when and what did the pharmacy advise?)  Is this the correct pharmacy for this prescription? Yes If no, delete pharmacy and type the correct one.  This is the patient's preferred pharmacy:  [id number] [place], [place] - [address] [facility] [address] [facility] [address] [place] [address] Phone: [phone] Fax: [phone]   Has the prescription been filled recently? No  Is the patient out of the medication? Yes  Has the patient been seen for an appointment in the last year OR does the patient have an upcoming appointment? Yes  Can we respond through MyChart? No, please call patient   Agent: Please be advised that Rx refills may take up to 3 business days. We ask that you follow-up with your pharmacy.

## 2023-12-24 NOTE — Progress Notes (Signed)
 Patient is in office today for a nurse visit for Blood Pressure Check. Patient blood pressure was 116/84, Patient denies any chest pain, shortness of breath or dyspnea on exertion.

## 2023-12-24 NOTE — Telephone Encounter (Signed)
 Requested medications are due for refill today.  yes  Requested medications are on the active medications list.  yes  Last refill. 09/02/2023 #90 0 rf  Future visit scheduled.   no  Notes to clinic.  Please review sig: Sig: Take 1 capsule (100 mg total) by mouth once for 1 dose.     Requested Prescriptions  Pending Prescriptions Disp Refills   gabapentin (NEURONTIN) 100 MG capsule 90 capsule 0    Sig: Take 1 capsule (100 mg total) by mouth once for 1 dose.     Neurology: Anticonvulsants - gabapentin Failed - 12/24/2023  5:28 PM      Failed - Valid encounter within last 12 months    Recent Outpatient Visits   None     Future Appointments             Tomorrow Patwardhan, Anabel Bene, MD [facility]            Passed - Cr in normal range and within 360 days    Creat  Date Value Ref Range Status  08/26/2023 0.74 0.50 - 1.03 mg/dL Final         Passed - Completed PHQ-2 or PHQ-9 in the last 360 days

## 2023-12-25 ENCOUNTER — Ambulatory Visit: Payer: 59 | Attending: Cardiology | Admitting: Cardiology

## 2023-12-25 MED ORDER — HYDROXYZINE HCL 25 MG PO TABS: ORAL_TABLET | ORAL | 0 refills | Status: DC

## 2023-12-25 MED ORDER — GABAPENTIN 100 MG PO CAPS: 100.0000 mg | ORAL_CAPSULE | Freq: Every day | ORAL | 0 refills | Status: DC

## 2023-12-25 NOTE — Progress Notes (Deleted)
  Cardiology Office Note:  .   Date:  12/25/2023  ID:  Kaitlin Carpenter, DOB 03-May-1971, MRN [id number] PCP: Park Meo, FNP  [facility] Providers Cardiologist:  Truett Mainland, MD PCP: Park Meo, FNP  No chief complaint on file.    Kaitlin Carpenter is a 53 y.o. female with *** Discussed the use of AI scribe software for clinical note transcription with the patient, who gave verbal consent to proceed.  History of Present Illness       There were no vitals filed for this visit.    ROS      Studies Reviewed: .        *** Independently interpreted 08/2023: Chol 173, TG 295, HDL 45, LDL 92 Hb 15, MCV 324.4 Cr 0.84 AST/ALT 94/76 high. total protein 8.9 high  12/2022: HbA1C 5.5% TSH 2.2  5 day monitor 10/2023:  Sinus rhythm HR 59 - 153, average 97 bpm. 3 episodes of  nonsustained SVT (longest 18 beats) No atrial fibrillation detected. Rare supraventricular ectopy. Rare ventricular ectopy. No sustained arrhythmias. Symptom trigger episodes correspond to sinus rhythm and sinus tachycardia, sometimes with artifact          Risk Assessment/Calculations:   {Does this patient have ATRIAL FIBRILLATION?[phone]}    Physical Exam   VISIT DIAGNOSES: No diagnosis found.   Kaitlin Carpenter is a 53 y.o. female with *** Assessment and Plan Assessment & Plan       {Are you ordering a CV Procedure (e.g. stress test, cath, DCCV, TEE, etc)?   Press F2        :[id number]}    No orders of the defined types were placed in this encounter.    F/u in ***  Signed, Elder Negus, MD

## 2023-12-25 NOTE — Telephone Encounter (Signed)
 Requested Prescriptions  Pending Prescriptions Disp Refills   hydrOXYzine (ATARAX) 25 MG tablet 90 tablet 0    Sig: Take 1/2 - 1 full tab PO q6hrs PRN     Ear, Nose, and Throat:  Antihistamines 2 Failed - 12/25/2023  8:04 AM      Failed - Valid encounter within last 12 months    Recent Outpatient Visits   None     Future Appointments             Today Kaitlin Carpenter, Kaitlin Bene, MD [facility], [facility]            Passed - Cr in normal range and within 360 days    Creat  Date Value Ref Range Status  08/26/2023 0.74 0.50 - 1.03 mg/dL Final          gabapentin (NEURONTIN) 100 MG capsule 90 capsule 0    Sig: Take 1 capsule (100 mg total) by mouth daily.     Neurology: Anticonvulsants - gabapentin Failed - 12/25/2023  8:04 AM      Failed - Valid encounter within last 12 months    Recent Outpatient Visits   None     Future Appointments             Today Kaitlin Carpenter, Kaitlin Bene, MD [facility], [facility]            Passed - Cr in normal range and within 360 days    Creat  Date Value Ref Range Status  08/26/2023 0.74 0.50 - 1.03 mg/dL Final         Passed - Completed PHQ-2 or PHQ-9 in the last 360 days

## 2024-01-21 ENCOUNTER — Other Ambulatory Visit: Payer: Self-pay | Admitting: Family Medicine

## 2024-01-21 DIAGNOSIS — F411 Generalized anxiety disorder: Secondary | ICD-10-CM

## 2024-01-22 DIAGNOSIS — F1020 Alcohol dependence, uncomplicated: Secondary | ICD-10-CM | POA: Diagnosis not present

## 2024-02-03 ENCOUNTER — Other Ambulatory Visit (HOSPITAL_COMMUNITY)
Admission: RE | Admit: 2024-02-03 | Discharge: 2024-02-03 | Disposition: A | Discharge status: Home / Self Care | Source: Ambulatory Visit | Attending: Physician Assistant | Admitting: Physician Assistant

## 2024-02-03 ENCOUNTER — Ambulatory Visit: Admitting: Physician Assistant

## 2024-02-03 ENCOUNTER — Encounter: Payer: Self-pay | Admitting: Physician Assistant

## 2024-02-03 VITALS — BP 123/78 | HR 62 | Ht 64.0 in | Wt 124.0 lb

## 2024-02-03 DIAGNOSIS — N760 Acute vaginitis: Secondary | ICD-10-CM

## 2024-02-03 DIAGNOSIS — I10 Essential (primary) hypertension: Secondary | ICD-10-CM

## 2024-02-03 DIAGNOSIS — F1010 Alcohol abuse, uncomplicated: Secondary | ICD-10-CM

## 2024-02-03 DIAGNOSIS — F411 Generalized anxiety disorder: Secondary | ICD-10-CM | POA: Diagnosis not present

## 2024-02-03 DIAGNOSIS — B9689 Other specified bacterial agents as the cause of diseases classified elsewhere: Secondary | ICD-10-CM

## 2024-02-03 DIAGNOSIS — R768 Other specified abnormal immunological findings in serum: Secondary | ICD-10-CM

## 2024-02-03 MED ORDER — METRONIDAZOLE 500 MG PO TABS: 500.0000 mg | ORAL_TABLET | Freq: Two times a day (BID) | ORAL | 0 refills | Status: AC

## 2024-02-03 NOTE — Patient Instructions (Addendum)
 VISIT SUMMARY:  During your visit, we discussed your current medications, evaluated your abdominal pain, and addressed your concerns about hepatitis C and bacterial vaginosis. We also reviewed your hypertension management and past medical history.  YOUR PLAN:  -BACTERIAL VAGINOSIS: Bacterial vaginosis is an infection caused by an imbalance of bacteria in the vagina. We will perform a vaginal swab to test for various infections, including gonorrhea, chlamydia, trichomonas, bacterial vaginosis, and yeast infection. You will start taking metronidazole 500 mg orally twice daily for 7 days to treat the infection.  -HEPATITIS C: Hepatitis C is a liver infection caused by the hepatitis C virus. Although you have not yet started treatment, it is important to establish care with an infectious disease specialist at your long term care to manage your condition.  - You can discontinue gabapentin 

## 2024-02-03 NOTE — Progress Notes (Signed)
 Established Patient Office Visit  Subjective   Patient ID: Kaitlin Carpenter, female    DOB: 08/17/1971  Age: 53 y.o. MRN: [id number]  Chief Complaint  Patient presents with   Medication Refill    Patient is needing to discontinue, Gabapentin  for after care services.    Hepatitis C    Untreated    Abdominal Pain    Pt desires to have std screening completed    Generalized Body Aches    From past injuries.   Discussed the use of AI scribe software for clinical note transcription with the patient, who gave verbal consent to proceed.  History of Present Illness   Kaitlin Carpenter is a 53 year old female with hypertension and hepatitis C who presents for medication management and evaluation of abdominal pain.  She is currently taking gabapentin  100 mg once daily, which she needs to discontinue to transition to long-term care. She is unsure why it was initially prescribed.  She has hypertension but is not on antihypertensive medication. She  is abstaining from smoking and alcohol while monitoring her diet.  She has hepatitis C, confirmed by testing, but has not yet seen an infectious disease specialist for treatment.   She experiences crampy abdominal pain similar to dysmenorrhea, ongoing since her arrival at the center. She also has a white, thick discharge with a slight odor, without itching or burning. Unknown exposure to STD.     Past Medical History:  Diagnosis Date   Anxiety    Essential hypertension 01/23/2023   Hepatitis C antibody positive    Social History   Socioeconomic History   Marital status: Divorced    Spouse name: Not on file   Number of children: Not on file   Years of education: Not on file   Highest education level: Not on file  Occupational History   Not on file  Tobacco Use   Smoking status: Every Day    Current packs/day: 1.00    Types: Cigarettes   Smokeless tobacco: Never  Vaping Use   Vaping status: Never Used  Substance and Sexual Activity    Alcohol use: Not Currently    Alcohol/week: 12.0 standard drinks of alcohol    Types: 12 Cans of beer per week   Drug use: Never   Sexual activity: Not Currently  Other Topics Concern   Not on file  Social History Narrative   Not on file   Social Drivers of Health   Financial Resource Strain: Not on file  Food Insecurity: No Food Insecurity (01/02/2023)   Hunger Vital Sign    Worried About Running Out of Food in the Last Year: Never true    Ran Out of Food in the Last Year: Never true  Transportation Needs: Unmet Transportation Needs (01/02/2023)   PRAPARE - Administrator, Civil Service (Medical): Yes    Lack of Transportation (Non-Medical): Yes  Physical Activity: Not on file  Stress: Not on file  Social Connections: Not on file  Intimate Partner Violence: Not At Risk (01/02/2023)   Humiliation, Afraid, Rape, and Kick questionnaire    Fear of Current or Ex-Partner: No    Emotionally Abused: No    Physically Abused: No    Sexually Abused: No   Family History  Family history unknown: Yes   No Known Allergies  Review of Systems  Constitutional:  Negative for chills and fever.  HENT: Negative.    Eyes: Negative.   Respiratory:  Negative for shortness  of breath.   Cardiovascular:  Negative for chest pain.  Gastrointestinal:  Positive for abdominal pain. Negative for nausea and vomiting.  Genitourinary:  Negative for dysuria, frequency and hematuria.  Musculoskeletal:  Negative for back pain.  Skin: Negative.   Neurological: Negative.   Psychiatric/Behavioral: Negative.        Objective:     BP 123/78 (BP Location: Left Arm, Patient Position: Sitting, Cuff Size: Normal)   Pulse 62   Ht 5\' 4"  (1.626 m)   Wt 124 lb (56.2 kg)   LMP 01/05/2021 (Within Days) Comment: Pt has not been active in a year and pt states she's not getting periods anymore.  BMI 21.28 kg/m  BP Readings from Last 3 Encounters:  02/03/24 123/78  12/24/23 116/84  10/10/23 118/70    Wt Readings from Last 3 Encounters:  02/03/24 124 lb (56.2 kg)  10/10/23 130 lb (59 kg)  09/03/23 125 lb 6 oz (56.9 kg)    Physical Exam Vitals and nursing note reviewed.  Abdominal:     Tenderness: There is abdominal tenderness in the suprapubic area. There is no right CVA tenderness or left CVA tenderness.    GENERAL: Alert, cooperative, well developed, no acute distress. HEENT: Normocephalic, normal oropharynx, moist mucous membranes. CHEST: Clear to auscultation bilaterally, no wheezes, rhonchi, or crackles. CARDIOVASCULAR: Normal heart rate and rhythm, S1 and S2 normal without murmurs. ABDOMEN: Soft, non-tender, non-distended, without organomegaly, normal bowel sounds. EXTREMITIES: No cyanosis or edema. NEUROLOGICAL: Cranial nerves grossly intact, moves all extremities without gross motor or sensory deficit.    Assessment & Plan:   Problem List Items Addressed This Visit       Cardiovascular and Mediastinum   Essential hypertension     Other   Alcohol abuse   GAD (generalized anxiety disorder)   Other Visit Diagnoses       Bacterial vaginitis    -  Primary   Relevant Medications   metroNIDAZOLE (FLAGYL) 500 MG tablet   Other Relevant Orders   Cervicovaginal ancillary only     Hepatitis C antibody positive in blood         1. Bacterial vaginitis (Primary) Patient declines HIV or syphilis testing.  Will treat based on clinical presentation.  Trial metronidazole, patient education given on supportive care. - Cervicovaginal ancillary only - metroNIDAZOLE (FLAGYL) 500 MG tablet; Take 1 tablet (500 mg total) by mouth 2 (two) times daily for 7 days.  Dispense: 14 tablet; Refill: 0  2. Essential hypertension Currently controlled through lifestyle modifications, blood pressure today within normal limits, not currently on medication  3. GAD (generalized anxiety disorder) Managed by [facility] psychiatry  4. Hepatitis C antibody positive in blood Patient is leaving  for long-term care in [place] , encouraged to follow-up with infectious disease in that area.  Patient understands and agrees  5. Alcohol abuse Currently in substance abuse treatment program  Okay to discontinue gabapentin , is only taking 100 mg once a day   I have reviewed the patient's medical history (PMH, PSH, Social History, Family History, Medications, and allergies) , and have been updated if relevant. I spent 30 minutes reviewing chart and  face to face time with patient.     Return if symptoms worsen or fail to improve.    Etter Hermann Mayers, PA-C

## 2024-02-04 LAB — CERVICOVAGINAL ANCILLARY ONLY
Bacterial Vaginitis (gardnerella): POSITIVE — AB
Candida Glabrata: NEGATIVE
Candida Vaginitis: NEGATIVE
Chlamydia: NEGATIVE
Comment: NEGATIVE
Comment: NEGATIVE
Comment: NEGATIVE
Comment: NEGATIVE
Comment: NEGATIVE
Comment: NORMAL
Neisseria Gonorrhea: NEGATIVE
Trichomonas: NEGATIVE

## 2024-02-19 DIAGNOSIS — F411 Generalized anxiety disorder: Secondary | ICD-10-CM | POA: Diagnosis not present

## 2024-02-26 ENCOUNTER — Emergency Department (HOSPITAL_BASED_OUTPATIENT_CLINIC_OR_DEPARTMENT_OTHER)
Admission: EM | Admit: 2024-02-26 | Discharge: 2024-02-26 | Disposition: A | Discharge status: Home / Self Care | Attending: Emergency Medicine | Admitting: Emergency Medicine

## 2024-02-26 ENCOUNTER — Other Ambulatory Visit: Payer: Self-pay

## 2024-02-26 ENCOUNTER — Encounter (HOSPITAL_BASED_OUTPATIENT_CLINIC_OR_DEPARTMENT_OTHER): Payer: Self-pay

## 2024-02-26 DIAGNOSIS — I10 Essential (primary) hypertension: Secondary | ICD-10-CM | POA: Diagnosis not present

## 2024-02-26 DIAGNOSIS — Z79899 Other long term (current) drug therapy: Secondary | ICD-10-CM | POA: Diagnosis not present

## 2024-02-26 DIAGNOSIS — J069 Acute upper respiratory infection, unspecified: Secondary | ICD-10-CM | POA: Insufficient documentation

## 2024-02-26 DIAGNOSIS — R059 Cough, unspecified: Secondary | ICD-10-CM | POA: Diagnosis not present

## 2024-02-26 LAB — RESP PANEL BY RT-PCR (RSV, FLU A&B, COVID)  RVPGX2
Influenza A by PCR: NEGATIVE
Influenza B by PCR: NEGATIVE
Resp Syncytial Virus by PCR: NEGATIVE
SARS Coronavirus 2 by RT PCR: NEGATIVE

## 2024-02-26 LAB — GROUP A STREP BY PCR: Group A Strep by PCR: NOT DETECTED

## 2024-02-26 MED ORDER — FLUTICASONE PROPIONATE 50 MCG/ACT NA SUSP
2.0000 | Freq: Every day | NASAL | Status: DC
  Administered 2024-02-26: 2 via NASAL
  Filled 2024-02-26: qty 16

## 2024-02-26 MED ORDER — FLUTICASONE PROPIONATE 50 MCG/ACT NA SUSP: 2.0000 | Freq: Every day | NASAL | 0 refills | Status: AC

## 2024-02-26 MED ORDER — BENZONATATE 100 MG PO CAPS: 200.0000 mg | ORAL_CAPSULE | Freq: Three times a day (TID) | ORAL | 0 refills | Status: AC | PRN

## 2024-02-26 MED ORDER — LORATADINE 10 MG PO TABS
10.0000 mg | ORAL_TABLET | Freq: Once | ORAL | Status: AC
  Administered 2024-02-26: 10 mg via ORAL
  Filled 2024-02-26: qty 1

## 2024-02-26 MED ORDER — LORATADINE 10 MG PO TABS
10.0000 mg | ORAL_TABLET | Freq: Every day | ORAL | 0 refills | Status: DC
Start: 2024-02-26 — End: 2024-05-04

## 2024-02-26 MED ORDER — BENZONATATE 100 MG PO CAPS
200.0000 mg | ORAL_CAPSULE | Freq: Once | ORAL | Status: AC
  Administered 2024-02-26: 200 mg via ORAL
  Filled 2024-02-26: qty 2

## 2024-02-26 MED ORDER — GUAIFENESIN ER 600 MG PO TB12: 1200.0000 mg | ORAL_TABLET | Freq: Two times a day (BID) | ORAL | 0 refills | Status: AC

## 2024-02-26 NOTE — ED Provider Notes (Signed)
 [facility] Provider Note   CSN: [id number] Arrival date & time: 02/26/24  1506     History  Chief Complaint  Patient presents with   URI    Kaitlin Carpenter is a 54 y.o. female past medical history significant for hypertension, chronic hepatitis, and hypertension presents today for 1 week of cough, congestion, postnasal drip, sore throat, body aches, and headaches.  Patient denies fever, chills, nausea, vomiting, diarrhea, abdominal pain, shortness of breath, or chest pain.   URI Presenting symptoms: congestion, cough, fatigue and sore throat   Associated symptoms: sinus pain        Home Medications Prior to Admission medications   Medication Sig Start Date End Date Taking? Authorizing Provider  benzonatate  (TESSALON ) 100 MG capsule Take 2 capsules (200 mg total) by mouth 3 (three) times daily as needed for cough. 02/26/24  Yes Analiah Drum N, PA-C  fluticasone  (FLONASE ) 50 MCG/ACT nasal spray Place 2 sprays into both nostrils daily. 02/26/24  Yes Charlee Whitebread N, PA-C  guaiFENesin  (MUCINEX ) 600 MG 12 hr tablet Take 2 tablets (1,200 mg total) by mouth 2 (two) times daily for 5 days. 02/26/24 03/02/24 Yes Leanny Moeckel N, PA-C  loratadine  (CLARITIN ) 10 MG tablet Take 1 tablet (10 mg total) by mouth daily. 02/26/24 03/27/24 Yes Annalea Alguire N, PA-C  FLUoxetine  (PROZAC ) 20 MG capsule Take 1 capsule (20 mg total) by mouth daily. 10/10/23   Jenelle Mis, FNP  FLUoxetine  (PROZAC ) 20 MG capsule Take 1 capsule (20 mg total) by mouth daily. 10/10/23   Jenelle Mis, FNP  gabapentin  (NEURONTIN ) 100 MG capsule Take 1 capsule (100 mg total) by mouth daily. 12/25/23   Jenelle Mis, FNP  hydrOXYzine  (ATARAX ) 25 MG tablet TAKE 1/2 - 1 FULL TABLET BY MOUTH EVERY 6 HOURS AS NEEDED 01/21/24   Austine Lefort, MD  nicotine  (NICODERM CQ  - DOSED IN MG/24 HOURS) 21 mg/24hr patch Place 1 patch (21 mg total) onto the skin daily. 01/21/23   Mayers, Cari S, PA-C   nicotine  polacrilex (NICORETTE ) 2 MG gum Take 1-2 each (2-4 mg total) by mouth as needed for smoking cessation. 01/09/23   Shery Done, MD  senna (SENOKOT) 8.6 MG TABS tablet Take 2 tablets (17.2 mg total) by mouth daily. 07/09/23   Jenelle Mis, FNP      Allergies    Patient has no known allergies.    Review of Systems   Review of Systems  Constitutional:  Positive for fatigue.  HENT:  Positive for congestion, postnasal drip, sinus pain and sore throat.   Respiratory:  Positive for cough.     Physical Exam Updated Vital Signs BP 113/82   Pulse 77   Temp 99.3 F (37.4 C) (Oral)   Resp 18   Ht 5\' 4"  (1.626 m)   Wt 56.1 kg   LMP 01/05/2021 (Within Days) Comment: Pt has not been active in a year and pt states she's not getting periods anymore.  SpO2 98%   BMI 21.23 kg/m  Physical Exam Vitals and nursing note reviewed.  Constitutional:      General: She is not in acute distress.    Appearance: She is well-developed. She is not toxic-appearing or diaphoretic.  HENT:     Head: Normocephalic and atraumatic.     Right Ear: Tympanic membrane and external ear normal.     Left Ear: Tympanic membrane and external ear normal.     Nose: Congestion present.  Mouth/Throat:     Mouth: Mucous membranes are moist.     Pharynx: Oropharynx is clear. Uvula midline. Postnasal drip present. No oropharyngeal exudate, posterior oropharyngeal erythema or uvula swelling.     Tonsils: No tonsillar exudate or tonsillar abscesses.  Eyes:     Extraocular Movements: Extraocular movements intact.     Conjunctiva/sclera: Conjunctivae normal.  Cardiovascular:     Rate and Rhythm: Normal rate and regular rhythm.     Pulses: Normal pulses.     Heart sounds: Normal heart sounds. No murmur heard. Pulmonary:     Effort: Pulmonary effort is normal. No respiratory distress.     Breath sounds: Normal breath sounds. No stridor. No wheezing or rhonchi.  Abdominal:     Palpations: Abdomen is soft.      Tenderness: There is no abdominal tenderness.  Musculoskeletal:        General: No swelling.     Cervical back: Neck supple.  Skin:    General: Skin is warm and dry.     Capillary Refill: Capillary refill takes less than 2 seconds.  Neurological:     General: No focal deficit present.     Mental Status: She is alert and oriented to person, place, and time.  Psychiatric:        Mood and Affect: Mood normal.     ED Results / Procedures / Treatments   Labs (all labs ordered are listed, but only abnormal results are displayed) Labs Reviewed  GROUP A STREP BY PCR  RESP PANEL BY RT-PCR (RSV, FLU A&B, COVID)  [id number]    EKG None  Radiology No results found.  Procedures Procedures    Medications Ordered in ED Medications  fluticasone  (FLONASE ) 50 MCG/ACT nasal spray 2 spray (2 sprays Each Nare Given 02/26/24 1621)  benzonatate  (TESSALON ) capsule 200 mg (200 mg Oral Given 02/26/24 1621)  loratadine  (CLARITIN ) tablet 10 mg (10 mg Oral Given 02/26/24 1621)    ED Course/ Medical Decision Making/ A&P                                 Medical Decision Making  This patient presents to the ED for concern of URI symptoms differential diagnosis includes COVID, flu, RSV, strep pharyngitis, viral pharyngitis, URI   Lab Tests:  I Ordered, and personally interpreted labs.  The pertinent results include: Strep PCR negative, Resp panel negative   Medicines ordered and prescription drug management:  I ordered medication including Flonase , Tessalon  Perles, Claritin     I have reviewed the patients home medicines and have made adjustments as needed   Problem List / ED Course:  Considered for admission or further workup however patients vital signs, physical exam, and labs are reassuring. Patient's symptoms likely due to a viral upper respiratory infection. Patient advised to take Tylenol /Motrin  as needed for fever, Flonase  as needed for nasal congestion, and plain Mucinex  as needed for  chest congestion.  Patient also advised to take a daily antihistamine such as Claritin  or Zyrtec.  Patient should follow-up with their primary care in the upcoming week if their symptoms persist for further evaluation and workup.           Final Clinical Impression(s) / ED Diagnoses Final diagnoses:  Upper respiratory tract infection, unspecified type    Rx / DC Orders ED Discharge Orders          Ordered    benzonatate  (TESSALON ) 100 MG capsule  3 times daily PRN        02/26/24 1620    fluticasone  (FLONASE ) 50 MCG/ACT nasal spray  Daily        02/26/24 1620    guaiFENesin  (MUCINEX ) 600 MG 12 hr tablet  2 times daily        02/26/24 1620    loratadine  (CLARITIN ) 10 MG tablet  Daily        02/26/24 1620              Carie Charity, PA-C 02/26/24 1622    Sallyanne Creamer, DO 03/03/24 1207

## 2024-02-26 NOTE — Discharge Instructions (Addendum)
 Today you were seen for an upper respiratory infection.  Please pick up your medications and take as directed.  You may alternate taking Tylenol  and Motrin  as needed for fever and pain, Flonase as needed for nasal congestion, and plain Mucinex as needed for chest congestion.  You may also take a antihistamine such as Claritin or Zyrtec.  Thank you for letting us  treat you today. After reviewing your labs and imaging, I feel you are safe to go home. Please follow up with your PCP in the next several days and provide them with your records from this visit. Return to the Emergency Room if pain becomes severe or symptoms worsen.

## 2024-02-26 NOTE — ED Triage Notes (Signed)
 Productive cough, congestion, sore throat, body aches, headaches for 1 week

## 2024-04-01 DIAGNOSIS — F1020 Alcohol dependence, uncomplicated: Secondary | ICD-10-CM | POA: Diagnosis not present

## 2024-04-20 ENCOUNTER — Encounter: Payer: Self-pay | Admitting: Physician Assistant

## 2024-04-20 ENCOUNTER — Ambulatory Visit: Admitting: Physician Assistant

## 2024-04-20 VITALS — BP 112/69 | HR 67 | Ht 64.0 in | Wt 124.6 lb

## 2024-04-20 DIAGNOSIS — M5441 Lumbago with sciatica, right side: Secondary | ICD-10-CM | POA: Diagnosis not present

## 2024-04-20 DIAGNOSIS — R748 Abnormal levels of other serum enzymes: Secondary | ICD-10-CM

## 2024-04-20 DIAGNOSIS — F1010 Alcohol abuse, uncomplicated: Secondary | ICD-10-CM

## 2024-04-20 DIAGNOSIS — G8929 Other chronic pain: Secondary | ICD-10-CM | POA: Diagnosis not present

## 2024-04-20 DIAGNOSIS — F1920 Other psychoactive substance dependence, uncomplicated: Secondary | ICD-10-CM | POA: Diagnosis not present

## 2024-04-20 DIAGNOSIS — K739 Chronic hepatitis, unspecified: Secondary | ICD-10-CM

## 2024-04-20 DIAGNOSIS — B1920 Unspecified viral hepatitis C without hepatic coma: Secondary | ICD-10-CM

## 2024-04-20 MED ORDER — GABAPENTIN 100 MG PO CAPS: 100.0000 mg | ORAL_CAPSULE | Freq: Three times a day (TID) | ORAL | 0 refills | Status: DC

## 2024-04-20 MED ORDER — CYCLOBENZAPRINE HCL 10 MG PO TABS: 10.0000 mg | ORAL_TABLET | Freq: Three times a day (TID) | ORAL | 0 refills | Status: DC | PRN

## 2024-04-20 MED ORDER — KETOROLAC TROMETHAMINE 60 MG/2ML IM SOLN
60.0000 mg | Freq: Once | INTRAMUSCULAR | Status: AC
Start: 2024-04-20 — End: 2024-04-20
  Administered 2024-04-20: 60 mg via INTRAMUSCULAR

## 2024-04-20 MED ORDER — METHYLPREDNISOLONE ACETATE 80 MG/ML IJ SUSP
80.0000 mg | Freq: Once | INTRAMUSCULAR | Status: AC
  Administered 2024-04-20: 80 mg via INTRAMUSCULAR

## 2024-04-20 NOTE — Progress Notes (Unsigned)
 Established Patient Office Visit  Subjective   Patient ID: Kaitlin Carpenter, female    DOB: 01-30-1971  Age: 53 y.o. MRN: [id number]  Chief Complaint  Patient presents with   Back Pain    Pt c/o midline back pain that is consistent. She is currently being treated with OTC medication tylenol  and ibuprofen  with minimal relief.    Medication Refill    Patient is requesting for refill on Gabapentin  she states she is no longer going to caring services she has decided to do the extension program at [facility]      Discussed the use of AI scribe software for clinical note transcription with the patient, who gave verbal consent to proceed.  History of Present Illness  History of Present Illness Kaitlin Carpenter is a 53 year old female with spinal stenosis who presents with chronic back pain.  She experiences consistent midline back pain for approximately twenty years, inadequately managed with ibuprofen  and Tylenol . A previous severe sciatica attack required the use of a walker and cane. An MRI at that time indicated early spinal stenosis. Gabapentin  provided some relief but was discontinued due to logistical issues and her preference for non-pill treatments.  She continues to be treated for substance abuse at [facility] recovery center.  Is doing a long-term program  She has a recent diagnosis of hepatitis C, confirmed by her primary doctor, and is concerned about potential liver damage. She has not yet consulted a specialist for this condition.  Physical Exam GENERAL: Alert, cooperative, well developed, no acute distress. HEENT: Normocephalic, normal oropharynx, moist mucous membranes. CHEST: Clear to auscultation bilaterally, no wheezes, rhonchi, or crackles. CARDIOVASCULAR: Normal heart rate and rhythm, S1 and S2 normal without murmurs. ABDOMEN: Soft, non-tender, non-distended, without organomegaly, normal bowel sounds. EXTREMITIES: No cyanosis or edema. NEUROLOGICAL: Cranial nerves grossly  intact, moves all extremities without gross motor or sensory deficit.  Results LABS HIV: Negative (01/2024) Hepatitis C: Positive (01/2024)  RADIOLOGY Lumbar spine MRI: Early stages of spinal stenosis  Assessment and Plan Chronic Back Pain with Spinal Stenosis Chronic debilitating back pain with sciatica and MRI-confirmed early spinal stenosis. Current analgesics insufficient. Gabapentin  previously effective. - Refer to orthopedics for further evaluation and management. - Restart gabapentin  at 100 mg three times daily. - Prescribe muscle relaxants every eight hours. - Administer steroid and analgesic injections for temporary relief. - Schedule follow-up in two weeks to reassess condition and treatment efficacy.  Hepatitis C Confirmed Hepatitis C with no prior specialist consultation. Discussed effective antiviral regimen and Medicaid coverage. - Initiate referral to infectious disease specialist for management. - Discuss 8-12 week antiviral treatment regimen, noting potential side effects such as headache. - Ensure Medicaid coverage for treatment.  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 Currently in long-term treatment with no current alcohol use reported. - Continue current treatment program at [facility].    HPI  Past Medical History:  Diagnosis Date   Anxiety    Essential hypertension 01/23/2023   Hepatitis C antibody positive    Social History   Socioeconomic History   Marital status: Divorced    Spouse name: Not on file   Number of children: Not on file   Years of education: Not on file   Highest education level: Not on file  Occupational History   Not on file  Tobacco Use   Smoking status: Every Day    Current packs/day: 1.00    Types: Cigarettes   Smokeless tobacco: Never  Vaping Use   Vaping status: Never Used  Substance and Sexual Activity   Alcohol use: Not Currently    Alcohol/week: 12.0 standard drinks of  alcohol    Types: 12 Cans of beer per week   Drug use: Never   Sexual activity: Not Currently  Other Topics Concern   Not on file  Social History Narrative   Not on file   Social Drivers of Health   Financial Resource Strain: Not on file  Food Insecurity: No Food Insecurity (01/02/2023)   Hunger Vital Sign    Worried About Running Out of Food in the Last Year: Never true    Ran Out of Food in the Last Year: Never true  Transportation Needs: Unmet Transportation Needs (01/02/2023)   PRAPARE - Administrator, Civil Service (Medical): Yes    Lack of Transportation (Non-Medical): Yes  Physical Activity: Not on file  Stress: Not on file  Social Connections: Not on file  Intimate Partner Violence: Not At Risk (01/02/2023)   Humiliation, Afraid, Rape, and Kick questionnaire    Fear of Current or Ex-Partner: No    Emotionally Abused: No    Physically Abused: No    Sexually Abused: No   Family History  Family history unknown: Yes   No Known Allergies  ROS    Objective:     BP 112/69 (BP Location: Left Arm, Patient Position: Sitting, Cuff Size: Normal)   Pulse 67   Ht 5' 4 (1.626 m)   Wt 124 lb 9.6 oz (56.5 kg)   LMP 01/05/2021 (Within Days) Comment: Pt has not been active in a year and pt states she's not getting periods anymore.  SpO2 97%   BMI 21.39 kg/m  BP Readings from Last 3 Encounters:  04/20/24 112/69  02/26/24 113/82  02/03/24 123/78   Wt Readings from Last 3 Encounters:  04/20/24 124 lb 9.6 oz (56.5 kg)  02/26/24 123 lb 11.2 oz (56.1 kg)  02/03/24 124 lb (56.2 kg)    Physical Exam     Assessment & Plan:   Problem List Items Addressed This Visit   None   No follow-ups on file.    Kirk RAMAN Mayers, PA-C

## 2024-04-20 NOTE — Patient Instructions (Signed)
 VISIT SUMMARY:  You visited today to discuss your chronic back pain due to spinal stenosis, your recent diagnosis of hepatitis C, and your history of substance use disorder. We reviewed your current symptoms, past treatments, and discussed new management plans for each condition.  YOUR PLAN:  -CHRONIC BACK PAIN We will refer you to an orthopedic specialist for further evaluation and management. You will restart gabapentin  at 100 mg three times daily, and we have prescribed muscle relaxants to be taken every eight hours as needed. Additionally, you will receive steroid and analgesic injections for temporary relief. We will reassess your condition and the effectiveness of this treatment in two weeks.  -HEPATITIS C: Hepatitis C is a liver infection caused by the hepatitis C virus. We discussed starting an antiviral treatment regimen that typically lasts 8-12 weeks. We will refer you to an infectious disease specialist for further management.

## 2024-05-04 ENCOUNTER — Ambulatory Visit: Admitting: Physician Assistant

## 2024-05-04 ENCOUNTER — Encounter: Payer: Self-pay | Admitting: Physician Assistant

## 2024-05-04 VITALS — BP 112/71 | HR 58 | Ht 64.0 in | Wt 123.0 lb

## 2024-05-04 DIAGNOSIS — J302 Other seasonal allergic rhinitis: Secondary | ICD-10-CM

## 2024-05-04 DIAGNOSIS — G8929 Other chronic pain: Secondary | ICD-10-CM

## 2024-05-04 DIAGNOSIS — B1920 Unspecified viral hepatitis C without hepatic coma: Secondary | ICD-10-CM | POA: Diagnosis not present

## 2024-05-04 DIAGNOSIS — F1010 Alcohol abuse, uncomplicated: Secondary | ICD-10-CM

## 2024-05-04 DIAGNOSIS — K739 Chronic hepatitis, unspecified: Secondary | ICD-10-CM

## 2024-05-04 DIAGNOSIS — M5441 Lumbago with sciatica, right side: Secondary | ICD-10-CM | POA: Diagnosis not present

## 2024-05-04 DIAGNOSIS — I10 Essential (primary) hypertension: Secondary | ICD-10-CM

## 2024-05-04 MED ORDER — ACETAMINOPHEN 500 MG PO TABS: ORAL_TABLET | ORAL | 1 refills | Status: AC

## 2024-05-04 MED ORDER — GABAPENTIN 300 MG PO CAPS
300.0000 mg | ORAL_CAPSULE | Freq: Three times a day (TID) | ORAL | 1 refills | Status: DC
Start: 2024-05-04 — End: 2024-06-08

## 2024-05-04 MED ORDER — CETIRIZINE HCL 10 MG PO TABS: 10.0000 mg | ORAL_TABLET | Freq: Every day | ORAL | 11 refills | Status: AC

## 2024-05-04 NOTE — Progress Notes (Unsigned)
 Established Patient Office Visit  Subjective   Patient ID: Kaitlin Carpenter, female    DOB: 1970-10-25  Age: 53 y.o. MRN: [id number]  Chief Complaint  Patient presents with  . Back Pain    Follow up, patient states injection given at last visit did offer some relief. She has been feeling pain within the last few days.    Discussed the use of AI scribe software for clinical note transcription with the patient, who gave verbal consent to proceed.  History of Present Illness  History of Present Illness Kaitlin Carpenter is a 53 year old female with chronic back pain who presents with worsening symptoms.  Her back pain, previously improved, has worsened over the past four days. The pain is described as an electric shock sensation radiating down her leg or hip. This pain has been persistent for about twenty years and is now unbearable. She is concerned about possible spinal stenosis or sciatica due to the hip involvement.  She started gabapentin  100 mg three times daily two weeks ago, initially providing relief. She questions if the relief was due to a previous steroid injection wearing off. Flexeril  at night is effective without causing drowsiness.  Severe allergy symptoms since moving to her current location affect her sleep, with watery eyes and coughing at night. She takes Claritin  in the morning. No sneezing is present.  Physical Exam GENERAL: Alert, cooperative, well developed, no acute distress HEENT: Normocephalic, normal oropharynx, moist mucous membranes CHEST: Clear to auscultation bilaterally, No wheezes, rhonchi, or crackles CARDIOVASCULAR: Normal heart rate and rhythm, S1 and S2 normal without murmurs ABDOMEN: Soft, non-tender, non-distended, without organomegaly, Normal bowel sounds EXTREMITIES: No cyanosis or edema NEUROLOGICAL: Cranial nerves grossly intact, Moves all extremities without gross motor or sensory deficit  Assessment and Plan Chronic back pain with lumbar  radiculopathy Worsening pain with electric shock sensation, likely due to spinal stenosis or sciatica. Gabapentin  provided initial relief but pain returned. Flexeril  effective at night. - Increase gabapentin  dosage. - Prescribe Tylenol  for pain management. - Ensure orthopedic appointment is scheduled with [facility].  Allergic rhinitis Symptoms worsening at night. Claritin 's effectiveness unclear. Benadryl avoided due to hydroxyzine  use. - Initiate Zyrtec  for allergy management.    Assessment and Plan Chronic Back Pain with Spinal Stenosis Chronic debilitating back pain with sciatica and MRI-confirmed early spinal stenosis. Current analgesics insufficient. Gabapentin  previously effective. - Refer to orthopedics for further evaluation and management. - Restart gabapentin  at 100 mg three times daily. - Prescribe muscle relaxants every eight hours prn  - Administer steroid and analgesic injections for temporary relief. - Schedule follow-up in two weeks to reassess condition and treatment efficacy.   Hepatitis C Confirmed Hepatitis C with no prior specialist consultation. Discussed effective antiviral regimen and Medicaid coverage. - Initiate referral to infectious disease specialist for management.     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 Currently in long-term treatment with no current alcohol use reported. - Continue current treatment program at [facility].    HPI  {History (Optional):23778}  ROS    Objective:     BP 112/71 (BP Location: Left Arm, Patient Position: Sitting, Cuff Size: Normal)   Pulse (!) 58   Ht 5' 4 (1.626 m)   Wt 123 lb (55.8 kg)   LMP 01/05/2021 (Within Days) Comment: Pt has not been active in a year and pt states she's not getting periods anymore.  BMI 21.11 kg/m  {Vitals History (Optional):23777}  Physical Exam  No results found for any visits on 05/04/24.  {Labs (Optional):23779}  The 10-year  ASCVD risk score (Arnett DK, et al., 2019) is: 3.6%    Assessment & Plan:   Problem List Items Addressed This Visit       Cardiovascular and Mediastinum   Essential hypertension     Digestive   Chronic hepatitis (HCC) - Primary     Nervous and Auditory   Acute right-sided low back pain with right-sided sciatica     Other   Alcohol abuse    No follow-ups on file.    Kirk RAMAN Mayers, PA-C

## 2024-05-04 NOTE — Patient Instructions (Signed)
 VISIT SUMMARY:  During your visit, we discussed your worsening chronic back pain and allergy symptoms. We reviewed your current medications and made some adjustments to help manage your symptoms more effectively.  YOUR PLAN:  -CHRONIC BACK PAIN WITH LUMBAR RADICULOPATHY: Your chronic back pain, which includes an electric shock sensation down your leg or hip, is likely due to spinal stenosis or sciatica. We will increase your gabapentin  dosage to help manage the pain and prescribe Tylenol  for additional pain relief. Please ensure you attend your scheduled appointment with [facility] for further evaluation.  -ALLERGIC RHINITIS: Your allergy symptoms, which include watery eyes and coughing at night, are likely due to allergic rhinitis. We will start you on Zyrtec  to help manage these symptoms, as Claritin 's effectiveness is unclear.  Allergies, Adult An allergy is a condition that causes the body's defense system (immune system) to react too strongly to an allergen. An allergen is a substance that is harmless to most people but can cause a reaction in some people. Allergies often affect the nose (allergic rhinitis), eyes (conjunctivitis), skin (atopic dermatitis), and stomach. They can be mild, moderate, or severe. They cannot spread from person to person. Allergies can start at any age. In some cases, they may go away as you get older. What are the causes? Allergies are caused by allergens. These may be: Outdoor allergens. These include pollen, car fumes, and mold. Indoor allergens. These include dust, smoke, mold, and pet dander. Other allergens. These include foods, medicines, scents, and insect bites or stings. What increases the risk? You are more likely to have allergies if you have: Family members with allergies. Family members who have a condition that may be caused by allergens, such as asthma. What are the signs or symptoms? Symptoms depend on how severe your allergy is. Mild to  moderate symptoms Runny nose, stuffy nose (nasal congestion), or sneezing. Itchy mouth, ears, or throat. Postnasal drip. This is a feeling of mucus dripping down the back of your throat. Sore throat. Itchy, red, watery, or puffy eyes. Skin rash, or itchy, red, swollen areas of skin (hives). Stomach cramps or bloating. Severe symptoms A bad allergy to food, medicine, or insect bites may cause a severe reaction (anaphylactic reaction). Symptoms include: A red face. Coughing or making high-pitched whistling sounds when you breathe, most often when you breathe out (wheezing). Swollen lips, tongue, or mouth. A tight or swollen throat. Chest pain or tightness, or a fast heartbeat. Trouble breathing or shortness of breath. Pain in your abdomen. Vomiting or diarrhea. Feeling dizzy or fainting. How is this diagnosed? Allergies are diagnosed based on your symptoms, your family and medical history, and a physical exam. You may also have tests, such as: Skin tests. These may be done to see how your skin reacts to allergens. Tests include: Skin prick test. For this test, the allergen is put in your body through a small prick in the skin. Intradermal skin test. For this test, a small amount of the allergen is put under the first layer of your skin. Patch test. For this test, a small amount of the allergen is placed on your skin. The area is covered and then checked after a few days. Blood tests. A challenge test. For this test, you eat or breathe in the allergen to see if you have a reaction. You may also be asked to: Keep a food diary. This means writing down all the foods, drinks, and symptoms you have in a day. Try an elimination diet.  To do this: Stop eating certain foods. Add those foods back one by one to find out if any of them cause a reaction. How is this treated?     Treatment for allergies depends on your symptoms. It may include: Cold, wet cloths (cold compresses). These can be  used to soothe itching and swelling. Eye drops or nasal sprays. A saline solution to clear out your nose and keep it moist (nasal irrigation). A saline solution is made of salt and water. A humidifier. This can add moisture to the air. Skin creams. These can treat rashes or itching. Diet changes to cut out foods that cause allergies. Being exposed again and again to tiny amounts of allergens. This can help your body build a defense against them (tolerance). This process is called immunotherapy. It may be done using: Allergy shots. This is when you get a shot of the allergen. Sublingual immunotherapy. This is when you take a small dose of the allergen under your tongue. Allergy medicines (antihistamines) or other medicines. These can help block the allergic reaction. Using an auto-injector pen. An auto-injector pen is a device filled with medicine that gives an emergency shot of epinephrine. Your health care provider will teach you how to use it. Follow these instructions at home: Medicines  Take or apply over-the-counter and prescription medicines only as told by your provider. Always carry your auto-injector pen if you are at risk of an anaphylactic reaction. Give yourself the shot as told by your provider. Eating and drinking Follow instructions from your provider about what you may eat and drink. Drink enough fluid to keep your pee (urine) pale yellow. General instructions Wear a medical alert bracelet or necklace if you have had an anaphylactic reaction in the past. Avoid known allergens when you can. Keep all follow-up visits. Your provider will watch your symptoms and talk about treatment options with you. Contact a health care provider if: Your symptoms do not get better with treatment. Get help right away if: You have any symptoms of anaphylactic reaction. You use an auto-injector pen. You will need more medical care even if the medicine seems to be working. An anaphylactic  reaction may happen again within 72 hours (rebound anaphylaxis). These symptoms may be an emergency. Use the auto-injector pen right away. Then call 911. Do not wait to see if the symptoms will go away. Do not drive yourself to the hospital. This information is not intended to replace advice given to you by your health care provider. Make sure you discuss any questions you have with your health care provider. Document Revised: 05/30/2022 Document Reviewed: 05/30/2022 Elsevier Patient Education  2024 [employer].

## 2024-05-05 ENCOUNTER — Telehealth: Payer: Self-pay

## 2024-05-05 ENCOUNTER — Other Ambulatory Visit (HOSPITAL_COMMUNITY): Payer: Self-pay

## 2024-05-05 DIAGNOSIS — F1020 Alcohol dependence, uncomplicated: Secondary | ICD-10-CM | POA: Diagnosis not present

## 2024-05-05 NOTE — Telephone Encounter (Signed)
 Pharmacy Patient Advocate Encounter  Insurance verification completed.   The patient is insured through [employer] claim for [employer]. Currently a quantity of 84 is a 28 day supply and will need a PA . [employer] will need a PA.   Medication will have to be filled with [facility].  This test claim was processed through [facility]- copay amounts may vary at other pharmacies due to pharmacy/plan contracts, or as the patient moves through the different stages of their insurance plan.

## 2024-05-06 ENCOUNTER — Encounter: Payer: Self-pay | Admitting: Physician Assistant

## 2024-05-11 ENCOUNTER — Encounter: Admitting: Family

## 2024-05-18 ENCOUNTER — Ambulatory Visit: Admitting: Physician Assistant

## 2024-05-18 ENCOUNTER — Encounter: Payer: Self-pay | Admitting: Physician Assistant

## 2024-05-18 VITALS — BP 107/73 | HR 55 | Ht 64.0 in | Wt 121.6 lb

## 2024-05-18 DIAGNOSIS — Z1231 Encounter for screening mammogram for malignant neoplasm of breast: Secondary | ICD-10-CM | POA: Diagnosis not present

## 2024-05-18 DIAGNOSIS — F1010 Alcohol abuse, uncomplicated: Secondary | ICD-10-CM

## 2024-05-18 DIAGNOSIS — M5441 Lumbago with sciatica, right side: Secondary | ICD-10-CM

## 2024-05-18 DIAGNOSIS — K739 Chronic hepatitis, unspecified: Secondary | ICD-10-CM | POA: Diagnosis not present

## 2024-05-18 MED ORDER — METHYLPREDNISOLONE ACETATE 80 MG/ML IJ SUSP
80.0000 mg | Freq: Once | INTRAMUSCULAR | Status: AC
Start: 2024-05-18 — End: 2024-05-18
  Administered 2024-05-18: 80 mg via INTRAMUSCULAR

## 2024-05-18 MED ORDER — KETOROLAC TROMETHAMINE 60 MG/2ML IM SOLN
60.0000 mg | Freq: Once | INTRAMUSCULAR | Status: AC
  Administered 2024-05-18: 60 mg via INTRAMUSCULAR

## 2024-05-18 NOTE — Progress Notes (Signed)
 Established Patient Office Visit  Subjective   Patient ID: Kaitlin Carpenter, female    DOB: 1970-10-15  Age: 53 y.o. MRN: [id number]  Chief Complaint  Patient presents with   Sciatica    Follow up    Discussed the use of AI scribe software for clinical note transcription with the patient, who gave verbal consent to proceed.  History of Present Illness   Kaitlin Carpenter is a 53 year old female who presents with back pain.  States that she continues to be treated for substance abuse at [facility] recovery center, is in the long-term program.  States that she did not know that appointment was scheduled for her to see infectious disease for evaluation and treatment of hepatitis C and [facility] did not take her to her appointment.  She experiences ongoing back pain with some relief from gabapentin  300 mg three times daily. She feels something is wrong with her spine.  She was referred to [facility], however an appointment has not been scheduled.     Past Medical History:  Diagnosis Date   Anxiety    Essential hypertension 01/23/2023   Hepatitis C antibody positive    Social History   Socioeconomic History   Marital status: Divorced    Spouse name: Not on file   Number of children: Not on file   Years of education: Not on file   Highest education level: Not on file  Occupational History   Not on file  Tobacco Use   Smoking status: Every Day    Current packs/day: 1.00    Types: Cigarettes   Smokeless tobacco: Never  Vaping Use   Vaping status: Never Used  Substance and Sexual Activity   Alcohol use: Not Currently    Alcohol/week: 12.0 standard drinks of alcohol    Types: 12 Cans of beer per week    Comment: Patient has been clean for 140 days on 08.04.2025   Drug use: Never   Sexual activity: Not Currently  Other Topics Concern   Not on file  Social History Narrative   Not on file   Social Drivers of Health   Financial Resource Strain: Not on file  Food Insecurity: No  Food Insecurity (01/02/2023)   Hunger Vital Sign    Worried About Running Out of Food in the Last Year: Never true    Ran Out of Food in the Last Year: Never true  Transportation Needs: Unmet Transportation Needs (01/02/2023)   PRAPARE - Administrator, Civil Service (Medical): Yes    Lack of Transportation (Non-Medical): Yes  Physical Activity: Not on file  Stress: Not on file  Social Connections: Not on file  Intimate Partner Violence: Not At Risk (01/02/2023)   Humiliation, Afraid, Rape, and Kick questionnaire    Fear of Current or Ex-Partner: No    Emotionally Abused: No    Physically Abused: No    Sexually Abused: No   Family History  Family history unknown: Yes   No Known Allergies  Review of Systems  Constitutional: Negative.   HENT: Negative.    Eyes: Negative.   Respiratory:  Negative for shortness of breath.   Cardiovascular:  Negative for chest pain.  Gastrointestinal: Negative.   Genitourinary: Negative.   Musculoskeletal:  Positive for back pain and myalgias.  Skin: Negative.   Neurological: Negative.   Endo/Heme/Allergies: Negative.   Psychiatric/Behavioral: Negative.        Objective:     BP 107/73 (BP Location: Left  Arm, Patient Position: Sitting, Cuff Size: Normal)   Pulse (!) 55   Ht 5' 4 (1.626 m)   Wt 121 lb 9.6 oz (55.2 kg)   LMP 01/05/2021 (Within Days) Comment: Pt has not been active in a year and pt states she's not getting periods anymore.  SpO2 99%   BMI 20.87 kg/m  BP Readings from Last 3 Encounters:  05/18/24 107/73  05/04/24 112/71  04/20/24 112/69   Wt Readings from Last 3 Encounters:  05/18/24 121 lb 9.6 oz (55.2 kg)  05/04/24 123 lb (55.8 kg)  04/20/24 124 lb 9.6 oz (56.5 kg)    Physical Exam Vitals and nursing note reviewed.    GENERAL: Alert, cooperative, well developed, no acute distress HEENT: Normocephalic, normal oropharynx, moist mucous membranes CHEST: Clear to auscultation bilaterally, no wheezes,  rhonchi, or crackles CARDIOVASCULAR: Normal heart rate and rhythm, S1 and S2 normal without murmurs EXTREMITIES: No cyanosis or edema NEUROLOGICAL: Cranial nerves grossly intact, moves all extremities without gross motor or sensory deficit    Assessment & Plan:   Problem List Items Addressed This Visit       Digestive   Chronic hepatitis (HCC)   Relevant Orders   CBC with Differential/Platelet   Comp. Metabolic Panel (12)     Nervous and Auditory   Acute right-sided low back pain with right-sided sciatica - Primary   Relevant Orders   Vitamin D , 25-hydroxy     Other   Alcohol abuse   Other Visit Diagnoses       Encounter for screening mammogram for malignant neoplasm of breast       Relevant Orders   MM DIGITAL SCREENING BILATERAL      1. Acute right-sided low back pain with right-sided sciatica (Primary) Continue current regimen, patient given injections in clinic today.  Encouraged to reach out to orthopedics to schedule appointment for further evaluation - methylPREDNISolone  acetate (DEPO-MEDROL ) injection 80 mg - ketorolac  (TORADOL ) injection 60 mg - Vitamin D , 25-hydroxy  2. Chronic hepatitis (HCC) Encouraged to reach out to infectious disease to schedule appointment for further evaluation - CBC with Differential/Platelet - Comp. Metabolic Panel (12)  3. Encounter for screening mammogram for malignant neoplasm of breast  - MM DIGITAL SCREENING BILATERAL; Future  4. Alcohol abuse Currently in substance abuse treatment program   I have reviewed the patient's medical history (PMH, PSH, Social History, Family History, Medications, and allergies) , and have been updated if relevant. I spent 30 minutes reviewing chart and  face to face time with patient.    Return if symptoms worsen or fail to improve.    Kirk RAMAN Mayers, PA-C

## 2024-05-18 NOTE — Patient Instructions (Addendum)
 Kaitlin Carpenter Ph# [phone] [address] [place]  [address] [place], [place] [address]  Also needs to be seen at [facility] - please call to schedule appointment there [phone]  Referrals have previously been placed, both locations have attempted contacting [facility] to schedule  VISIT SUMMARY:  You came in today for your ongoing back pain. We discussed your current medications and treatment options, including a steroid injection. We also reviewed your allergic rhinitis management.  YOUR PLAN:  -CHRONIC LOW BACK PAIN: Chronic low back pain is a long-term pain in your lower back. You will continue taking gabapentin  300 mg three times a day. We administered a steroid injection today to help with the pain. We will also refer you to an orthopedic specialist for further evaluation. Additionally, we will check your kidney function, liver function, anemia, and vitamin D  levels.  -ALLERGIC RHINITIS: Allergic rhinitis is an allergic reaction that causes sneezing, congestion, and a runny nose. You should continue taking Zyrtec  as it is effectively managing your symptoms.

## 2024-05-20 LAB — CBC WITH DIFFERENTIAL/PLATELET
Basophils Absolute: 0 x10E3/uL (ref 0.0–0.2)
Basos: 1 %
EOS (ABSOLUTE): 0.3 x10E3/uL (ref 0.0–0.4)
Eos: 6 %
Hematocrit: 41.5 % (ref 34.0–46.6)
Hemoglobin: 13 g/dL (ref 11.1–15.9)
Immature Grans (Abs): 0 x10E3/uL (ref 0.0–0.1)
Immature Granulocytes: 0 %
Lymphocytes Absolute: 2 x10E3/uL (ref 0.7–3.1)
Lymphs: 44 %
MCH: 32.7 pg (ref 26.6–33.0)
MCHC: 31.3 g/dL — ABNORMAL LOW (ref 31.5–35.7)
MCV: 105 fL — ABNORMAL HIGH (ref 79–97)
Monocytes Absolute: 0.4 x10E3/uL (ref 0.1–0.9)
Monocytes: 10 %
Neutrophils Absolute: 1.7 x10E3/uL (ref 1.4–7.0)
Neutrophils: 39 %
Platelets: 191 x10E3/uL (ref 150–450)
RBC: 3.97 x10E6/uL (ref 3.77–5.28)
RDW: 12.2 % (ref 11.7–15.4)
WBC: 4.5 x10E3/uL (ref 3.4–10.8)

## 2024-05-20 LAB — COMP. METABOLIC PANEL (12)
AST: 27 IU/L (ref 0–40)
Albumin: 4.4 g/dL (ref 3.8–4.9)
Alkaline Phosphatase: 76 IU/L (ref 44–121)
BUN/Creatinine Ratio: 15 (ref 9–23)
BUN: 13 mg/dL (ref 6–24)
Bilirubin Total: 0.2 mg/dL (ref 0.0–1.2)
Calcium: 9.7 mg/dL (ref 8.7–10.2)
Chloride: 99 mmol/L (ref 96–106)
Creatinine, Ser: 0.85 mg/dL (ref 0.57–1.00)
Globulin, Total: 3.2 g/dL (ref 1.5–4.5)
Glucose: 82 mg/dL (ref 70–99)
Potassium: 4.4 mmol/L (ref 3.5–5.2)
Sodium: 139 mmol/L (ref 134–144)
Total Protein: 7.6 g/dL (ref 6.0–8.5)
eGFR: 81 mL/min/1.73 (ref >59)

## 2024-05-20 LAB — VITAMIN D 25 HYDROXY (VIT D DEFICIENCY, FRACTURES): Vit D, 25-Hydroxy: 33 ng/mL (ref 30.0–100.0)

## 2024-05-21 ENCOUNTER — Ambulatory Visit: Payer: Self-pay | Admitting: Physician Assistant

## 2024-06-02 ENCOUNTER — Other Ambulatory Visit: Payer: Self-pay | Admitting: Family Medicine

## 2024-06-02 DIAGNOSIS — M5441 Lumbago with sciatica, right side: Secondary | ICD-10-CM

## 2024-06-02 DIAGNOSIS — J302 Other seasonal allergic rhinitis: Secondary | ICD-10-CM

## 2024-06-02 NOTE — Telephone Encounter (Signed)
 Copied from CRM #[id number]. Topic: Clinical - Medication Refill >> Jun 02, 2024 11:31 AM Wess RAMAN wrote: Medication: gabapentin  (NEURONTIN ) 300 MG capsule  cetirizine  (ZYRTEC  ALLERGY) 10 MG tablet  acetaminophen  (TYLENOL ) 500 MG tablet   Has the patient contacted their pharmacy? Yes (Agent: If no, request that the patient contact the pharmacy for the refill. If patient does not wish to contact the pharmacy document the reason why and proceed with request.) (Agent: If yes, when and what did the pharmacy advise?)  This is the patient's preferred pharmacy:   [facility] - [place], [place] - [address] [place] [address] Phone: [phone] Fax: [phone]  Is this the correct pharmacy for this prescription? Yes If no, delete pharmacy and type the correct one.   Has the prescription been filled recently? Yes  Is the patient out of the medication? Yes  Has the patient been seen for an appointment in the last year OR does the patient have an upcoming appointment? Yes  Can we respond through MyChart? No  Agent: Please be advised that Rx refills may take up to 3 business days. We ask that you follow-up with your pharmacy.

## 2024-06-05 ENCOUNTER — Encounter: Admitting: Family

## 2024-06-08 ENCOUNTER — Encounter: Payer: Self-pay | Admitting: Physician Assistant

## 2024-06-08 ENCOUNTER — Ambulatory Visit: Payer: MEDICAID | Admitting: Physician Assistant

## 2024-06-08 VITALS — BP 109/61 | Ht 63.0 in | Wt 119.0 lb

## 2024-06-08 DIAGNOSIS — M5441 Lumbago with sciatica, right side: Secondary | ICD-10-CM | POA: Diagnosis not present

## 2024-06-08 DIAGNOSIS — Z1211 Encounter for screening for malignant neoplasm of colon: Secondary | ICD-10-CM

## 2024-06-08 DIAGNOSIS — F1010 Alcohol abuse, uncomplicated: Secondary | ICD-10-CM

## 2024-06-08 DIAGNOSIS — K739 Chronic hepatitis, unspecified: Secondary | ICD-10-CM

## 2024-06-08 MED ORDER — CYCLOBENZAPRINE HCL 10 MG PO TABS: 10.0000 mg | ORAL_TABLET | Freq: Three times a day (TID) | ORAL | 0 refills | Status: DC | PRN

## 2024-06-08 MED ORDER — METHYLPREDNISOLONE ACETATE 80 MG/ML IJ SUSP
80.0000 mg | Freq: Once | INTRAMUSCULAR | Status: AC
  Administered 2024-06-08: 80 mg via INTRAMUSCULAR

## 2024-06-08 MED ORDER — KETOROLAC TROMETHAMINE 60 MG/2ML IM SOLN
60.0000 mg | Freq: Once | INTRAMUSCULAR | Status: AC
  Administered 2024-06-08: 60 mg via INTRAMUSCULAR

## 2024-06-08 MED ORDER — GABAPENTIN 300 MG PO CAPS: 300.0000 mg | ORAL_CAPSULE | Freq: Three times a day (TID) | ORAL | 1 refills | Status: DC

## 2024-06-08 NOTE — Progress Notes (Unsigned)
 Established Patient Office Visit  Subjective   Patient ID: Kaitlin Carpenter, female    DOB: 1971-03-03  Age: 53 y.o. MRN: [id number]  Chief Complaint  Patient presents with   Medication Problem    Patient is requesting her allergy medication be moved to the evening. Currently taking in the am with other medication, patient states she is having a hard time staying awake.    Back Pain    Patient has c/o continued back pain.   Discussed the use of AI scribe software for clinical note transcription with the patient, who gave verbal consent to proceed.  History of Present Illness   Kaitlin Carpenter is a 53 year old female with chronic back pain who presents for pain management and follow-up.   She continues to be treated for substance abuse at [facility].   She has experienced severe, persistent back pain for about a year, affecting daily activities. She awaits an orthopedic appointment and is frustrated by the delay. Previous steroid injections provided relief for two weeks, with soreness on the first day post-injection.   She takes hydroxyzine  at bedtime and Zyrtec  in the morning, considering moving Zyrtec  to bedtime. She has been out of Flexeril  but finds it helpful at night. She is uncertain about using ibuprofen  due to her medical history.  She has hepatitis C, with normal liver function tests three weeks ago. Awaiting apt with [facility]. She has elevated triglycerides and plans to recheck cholesterol in November. Her Pap smear was normal in December, and she has a pending mammogram order.      Past Medical History:  Diagnosis Date   Anxiety    Essential hypertension 01/23/2023   Hepatitis C antibody positive    Social History   Socioeconomic History   Marital status: Divorced    Spouse name: Not on file   Number of children: Not on file   Years of education: Not on file   Highest education level: Not on file  Occupational History   Not on file   Tobacco Use   Smoking status: Every Day    Current packs/day: 1.00    Types: Cigarettes   Smokeless tobacco: Never  Vaping Use   Vaping status: Never Used  Substance and Sexual Activity   Alcohol use: Not Currently    Alcohol/week: 12.0 standard drinks of alcohol    Types: 12 Cans of beer per week    Comment: Patient has been clean for 140 days on 08.04.2025   Drug use: Never   Sexual activity: Not Currently  Other Topics Concern   Not on file  Social History Narrative   Not on file   Social Drivers of Health   Financial Resource Strain: Not on file  Food Insecurity: No Food Insecurity (01/02/2023)   Hunger Vital Sign    Worried About Running Out of Food in the Last Year: Never true    Ran Out of Food in the Last Year: Never true  Transportation Needs: Unmet Transportation Needs (01/02/2023)   PRAPARE - Administrator, Civil Service (Medical): Yes    Lack of Transportation (Non-Medical): Yes  Physical Activity: Not on file  Stress: Not on file  Social Connections: Not on file  Intimate Partner Violence: Not At Risk (01/02/2023)   Humiliation, Afraid, Rape, and Kick questionnaire    Fear of Current or Ex-Partner: No    Emotionally Abused: No    Physically Abused: No    Sexually Abused: No  Family History  Family history unknown: Yes   No Known Allergies  Review of Systems  Constitutional: Negative.   HENT: Negative.    Eyes: Negative.   Respiratory:  Negative for shortness of breath.   Cardiovascular:  Negative for chest pain.  Genitourinary: Negative.   Musculoskeletal:  Positive for back pain, joint pain and myalgias.  Skin: Negative.   Neurological: Negative.   Endo/Heme/Allergies: Negative.   Psychiatric/Behavioral: Negative.        Objective:     BP 109/61 (BP Location: Left Arm, Patient Position: Sitting, Cuff Size: Normal)   Ht 5' 3 (1.6 m)   Wt 119 lb (54 kg)   LMP 01/05/2021 (Within Days) Comment: Pt has not been active in a year and  pt states she's not getting periods anymore.  SpO2 96%   BMI 21.08 kg/m  BP Readings from Last 3 Encounters:  06/08/24 109/61  05/18/24 107/73  05/04/24 112/71   Wt Readings from Last 3 Encounters:  06/08/24 119 lb (54 kg)  05/18/24 121 lb 9.6 oz (55.2 kg)  05/04/24 123 lb (55.8 kg)    Physical Exam Vitals and nursing note reviewed.   GENERAL: Alert, cooperative, well developed, no acute distress HEENT: Normocephalic, normal oropharynx, moist mucous membranes CHEST: Clear to auscultation bilaterally, No wheezes, rhonchi, or crackles CARDIOVASCULAR: Normal heart rate and rhythm, S1 and S2 normal without murmurs EXTREMITIES: No cyanosis or edema NEUROLOGICAL: Cranial nerves grossly intact, Moves all extremities without gross motor or sensory deficit    Assessment & Plan:   Problem List Items Addressed This Visit       Digestive   Chronic hepatitis (HCC) - Primary     Nervous and Auditory   Acute right-sided low back pain with right-sided sciatica   Relevant Medications   cyclobenzaprine  (FLEXERIL ) 10 MG tablet   gabapentin  (NEURONTIN ) 300 MG capsule     Other   Alcohol abuse   Other Visit Diagnoses       Screen for colon cancer       Relevant Orders   Cologuard      Assessment and Plan Chronic hepatitis Chronic hepatitis with normal liver function. Awaiting infectious disease appt - infectious disease for further management of chronic hepatitis.  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 Previous steroid injections provided temporary relief.   - Administer steroid injection for pain relief. - Refill Flexeril  and gabapentin  prescriptions. - Move Zyrtec  administration to bedtime. - Schedule appointment with orthopedics for further evaluation.  Alcohol abuse Currently in substance abuse treatment program Alcohol abuse with previous elevated triglycerides likely due to alcohol. - Recheck cholesterol  levels at the end of November.  General Health Maintenance  Mammogram and Cologuard ordered. - Schedule mammogram. - Complete Cologuard test for colorectal cancer screening.   I have reviewed the patient's medical history (PMH, PSH, Social History, Family History, Medications, and allergies) , and have been updated if relevant. I spent 30 minutes reviewing chart and  face to face time with patient.    Return if symptoms worsen or fail to improve.    Kirk RAMAN Mayers, PA-C

## 2024-06-08 NOTE — Patient Instructions (Signed)
 VISIT SUMMARY:  You came in today for pain management and follow-up for your chronic back pain. We discussed your current medications, recent health concerns, and upcoming appointments.  YOUR PLAN:   -CHRONIC LOW BACK PAIN WITH RIGHT-SIDED SCIATICA: Chronic low back pain with sciatica is ongoing pain in the lower back that radiates down the leg. We will administer a steroid injection for pain relief, refill your Flexeril  and gabapentin  prescriptions, and adjust your Zyrtec  to bedtime. Please keep your upcoming orthopedic appointment for further evaluation.   -GENERAL HEALTH MAINTENANCE: Your recent Pap smear was normal. Please schedule your mammogram and complete the Cologuard test for colorectal cancer screening.

## 2024-06-09 ENCOUNTER — Other Ambulatory Visit: Payer: Self-pay | Admitting: Physician Assistant

## 2024-06-09 DIAGNOSIS — Z1231 Encounter for screening mammogram for malignant neoplasm of breast: Secondary | ICD-10-CM

## 2024-06-10 ENCOUNTER — Encounter: Payer: Self-pay | Admitting: Physician Assistant

## 2024-06-19 ENCOUNTER — Ambulatory Visit
Admission: RE | Admit: 2024-06-19 | Discharge: 2024-06-19 | Disposition: A | Discharge status: Home / Self Care | Source: Ambulatory Visit | Attending: Physician Assistant | Admitting: Physician Assistant

## 2024-06-19 DIAGNOSIS — Z1231 Encounter for screening mammogram for malignant neoplasm of breast: Secondary | ICD-10-CM

## 2024-06-24 ENCOUNTER — Other Ambulatory Visit: Payer: Self-pay | Admitting: Physician Assistant

## 2024-06-24 ENCOUNTER — Ambulatory Visit: Payer: Self-pay | Admitting: Physician Assistant

## 2024-06-24 DIAGNOSIS — R928 Other abnormal and inconclusive findings on diagnostic imaging of breast: Secondary | ICD-10-CM

## 2024-06-25 ENCOUNTER — Ambulatory Visit (INDEPENDENT_AMBULATORY_CARE_PROVIDER_SITE_OTHER): Payer: MEDICAID | Admitting: Internal Medicine

## 2024-06-25 ENCOUNTER — Encounter: Payer: Self-pay | Admitting: Internal Medicine

## 2024-06-25 ENCOUNTER — Other Ambulatory Visit: Payer: Self-pay

## 2024-06-25 VITALS — BP 123/81 | HR 59 | Temp 98.4°F | Resp 16 | Wt 116.0 lb

## 2024-06-25 DIAGNOSIS — Z23 Encounter for immunization: Secondary | ICD-10-CM | POA: Diagnosis not present

## 2024-06-25 DIAGNOSIS — F17210 Nicotine dependence, cigarettes, uncomplicated: Secondary | ICD-10-CM | POA: Diagnosis not present

## 2024-06-25 DIAGNOSIS — B182 Chronic viral hepatitis C: Secondary | ICD-10-CM | POA: Diagnosis not present
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
## 2024-07-02 LAB — HEPATITIS C RNA QUANTITATIVE
HCV Quantitative Log: 4.84 {Log_IU}/mL — ABNORMAL HIGH
HCV RNA, PCR, QN: 69400 [IU]/mL — ABNORMAL HIGH

## 2024-07-02 LAB — CBC WITH DIFFERENTIAL/PLATELET
Absolute Lymphocytes: 2205 {cells}/uL (ref 850–3900)
Absolute Monocytes: 586 {cells}/uL (ref 200–950)
Basophils Absolute: 50 {cells}/uL (ref 0–200)
Basophils Relative: 0.8 %
Eosinophils Absolute: 221 {cells}/uL (ref 15–500)
Eosinophils Relative: 3.5 %
HCT: 38.9 % (ref 35.0–45.0)
Hemoglobin: 13.3 g/dL (ref 11.7–15.5)
MCH: 32.8 pg (ref 27.0–33.0)
MCHC: 34.2 g/dL (ref 32.0–36.0)
MCV: 95.8 fL (ref 80.0–100.0)
MPV: 10.7 fL (ref 7.5–12.5)
Monocytes Relative: 9.3 %
Neutro Abs: 3238 {cells}/uL (ref 1500–7800)
Neutrophils Relative %: 51.4 %
Platelets: 270 Thousand/uL (ref 140–400)
RBC: 4.06 Million/uL (ref 3.80–5.10)
RDW: 12.6 % (ref 11.0–15.0)
Total Lymphocyte: 35 %
WBC: 6.3 Thousand/uL (ref 3.8–10.8)

## 2024-07-02 LAB — COMPLETE METABOLIC PANEL WITHOUT GFR
AG Ratio: 1.4 (calc) (ref 1.0–2.5)
ALT: 25 U/L (ref 6–29)
AST: 24 U/L (ref 10–35)
Albumin: 4.2 g/dL (ref 3.6–5.1)
Alkaline phosphatase (APISO): 63 U/L (ref 37–153)
BUN: 16 mg/dL (ref 7–25)
CO2: 32 mmol/L (ref 20–32)
Calcium: 10.2 mg/dL (ref 8.6–10.4)
Chloride: 98 mmol/L (ref 98–110)
Creat: 0.89 mg/dL (ref 0.50–1.03)
Globulin: 3.1 g/dL (ref 1.9–3.7)
Glucose, Bld: 83 mg/dL (ref 65–99)
Potassium: 4.7 mmol/L (ref 3.5–5.3)
Sodium: 137 mmol/L (ref 135–146)
Total Bilirubin: 0.3 mg/dL (ref 0.2–1.2)
Total Protein: 7.3 g/dL (ref 6.1–8.1)

## 2024-07-02 LAB — HEPATITIS B CORE ANTIBODY, TOTAL: Hep B Core Total Ab: REACTIVE — AB

## 2024-07-02 LAB — HEPATITIS B SURFACE ANTIBODY,QUALITATIVE: Hep B S Ab: NONREACTIVE

## 2024-07-02 LAB — HIV ANTIBODY (ROUTINE TESTING W REFLEX)
HIV 1&2 Ab, 4th Generation: NONREACTIVE
HIV FINAL INTERPRETATION: NEGATIVE

## 2024-07-02 LAB — LIVER FIBROSIS, FIBROTEST-ACTITEST
ALT: 24 U/L (ref 6–29)
Alpha-2-Macroglobulin: 409 mg/dL — ABNORMAL HIGH (ref 106–279)
Apolipoprotein A1: 169 mg/dL (ref 101–198)
Bilirubin: 0.3 mg/dL (ref 0.2–1.2)
Fibrosis Score: 0.28
GGT: 20 U/L (ref 3–70)
Haptoglobin: 148 mg/dL (ref 43–212)
Necroinflammat ACT Score: 0.1
Reference ID: 5717264

## 2024-07-02 LAB — HEPATITIS C GENOTYPE: HCV Genotype: 3

## 2024-07-02 LAB — HEPATITIS A ANTIBODY, TOTAL: Hepatitis A AB,Total: NONREACTIVE

## 2024-07-02 LAB — PROTIME-INR
INR: 1
Prothrombin Time: 10.3 s (ref 9.0–11.5)

## 2024-07-02 LAB — HEPATITIS B SURFACE ANTIGEN: Hepatitis B Surface Ag: NONREACTIVE

## 2024-07-07 ENCOUNTER — Ambulatory Visit
Admission: RE | Admit: 2024-07-07 | Discharge: 2024-07-07 | Disposition: A | Payer: MEDICAID | Source: Ambulatory Visit | Attending: Physician Assistant

## 2024-07-07 ENCOUNTER — Ambulatory Visit

## 2024-07-07 ENCOUNTER — Other Ambulatory Visit: Payer: Self-pay | Admitting: Physician Assistant

## 2024-07-07 DIAGNOSIS — R921 Mammographic calcification found on diagnostic imaging of breast: Secondary | ICD-10-CM

## 2024-07-07 DIAGNOSIS — R928 Other abnormal and inconclusive findings on diagnostic imaging of breast: Secondary | ICD-10-CM

## 2024-07-09 ENCOUNTER — Ambulatory Visit: Payer: Self-pay | Admitting: Physician Assistant

## 2024-07-13 ENCOUNTER — Other Ambulatory Visit (INDEPENDENT_AMBULATORY_CARE_PROVIDER_SITE_OTHER): Payer: MEDICAID

## 2024-07-13 ENCOUNTER — Ambulatory Visit (INDEPENDENT_AMBULATORY_CARE_PROVIDER_SITE_OTHER): Payer: MEDICAID | Admitting: Orthopedic Surgery

## 2024-07-13 ENCOUNTER — Encounter: Payer: Self-pay | Admitting: Orthopedic Surgery

## 2024-07-13 VITALS — BP 105/70 | HR 64 | Ht 63.0 in | Wt 116.0 lb

## 2024-07-13 DIAGNOSIS — M5442 Lumbago with sciatica, left side: Secondary | ICD-10-CM

## 2024-07-13 DIAGNOSIS — M5441 Lumbago with sciatica, right side: Secondary | ICD-10-CM

## 2024-07-13 MED ORDER — GABAPENTIN 300 MG PO CAPS
600.0000 mg | ORAL_CAPSULE | Freq: Three times a day (TID) | ORAL | 1 refills | Status: AC
Start: 2024-07-13 — End: 2024-09-11

## 2024-07-13 NOTE — Progress Notes (Signed)
 Orthopedic Spine Surgery Office Note  Assessment: Patient is a 53 y.o. female with low back pain that radiates into the bilateral anterolateral aspect of the thighs and legs, suspect radiculopathy   Plan: -Explained that initially conservative treatment is tried as a significant number of patients may experience relief with these treatment modalities. Discussed that the conservative treatments include:  -activity modification  -physical therapy  -over the counter pain medications  -medrol  dosepak  -lumbar steroid injections -Patient has tried Tylenol , intramuscular steroid injection, gabapentin  -Recommended PT. Prescribed a higher dose of gabapentin .  Gave her a way to gradually increase to this dose -If patient is not doing any better at her next visit, we will order an MRI to evaluate for radiculopathy -Patient should return to office in 8 weeks, x-rays at next visit: none   Patient expressed understanding of the plan and all questions were answered to the patient's satisfaction.   ___________________________________________________________________________   History:  Patient is a 53 y.o. female who presents today for lumbar spine.  Patient has had several years of low back pain that has gotten progressively worse with time.  She has also had pain that radiates into the bilateral lower extremities.  She feels the going along the anterolateral aspect of the thighs and legs.  Pain is more significant in her back than her legs.     Weakness: Denies Symptoms of imbalance: Denies Paresthesias and numbness: Denies Bowel or bladder incontinence: Denies Saddle anesthesia: Denies  Treatments tried: PT, intramuscular steroid reduction, gabapentin   Review of systems: Denies fevers and chills, night sweats, unexplained weight loss, history of cancer.  Has had pain that wakes her at night  Past medical history: Anxiety Migraines Hepatitis Chronic pain HTN   Allergies: NKDA  Past  surgical history:  Shoulder surgery  Social history: Denies use of nicotine  product (smoking, vaping, patches, smokeless) Alcohol use: Denies Denies recreational drug use   Physical Exam:  BMI of 20.6  General: no acute distress, appears stated age Neurologic: alert, answering questions appropriately, following commands Respiratory: unlabored breathing on room air, symmetric chest rise Psychiatric: appropriate affect, normal cadence to speech   MSK (spine):  -Strength exam      Left  Right EHL    5/5  5/5 TA    5/5  5/5 GSC    5/5  5/5 Knee extension  5/5  5/5 Hip flexion   5/5  5/5  -Sensory exam    Sensation intact to light touch in L3-S1 nerve distributions of bilateral lower extremities  -Achilles DTR: 2/4 on the left, 2/4 on the right -Patellar tendon DTR: 2/4 on the left, 2/4 on the right  -Straight leg raise: negative bilaterally -Clonus: no beats bilaterally  -Left hip exam: no pain through range of motion -Right hip exam: no pain through range of motion  Imaging: XRs of the lumbar spine from 07/13/2024 were independently reviewed and interpreted, showing grade 1 spondylolisthesis at L4/5.  Disc height loss with anterior osteophyte formation at L5/S1.  Vacuum disc phenomenon at L5/S1.  No other significant degenerative changes seen.  No fracture or dislocation seen.   Patient name: Kaitlin Carpenter Patient MRN: [id number] Date of visit: 07/13/24

## 2024-07-16 ENCOUNTER — Ambulatory Visit
Admission: RE | Admit: 2024-07-16 | Discharge: 2024-07-16 | Disposition: A | Discharge status: Home / Self Care | Payer: MEDICAID | Source: Ambulatory Visit | Attending: Physician Assistant | Admitting: Physician Assistant

## 2024-07-16 DIAGNOSIS — R921 Mammographic calcification found on diagnostic imaging of breast: Secondary | ICD-10-CM

## 2024-07-16 HISTORY — PX: BREAST BIOPSY: SHX20

## 2024-07-17 LAB — SURGICAL PATHOLOGY

## 2024-07-20 ENCOUNTER — Ambulatory Visit: Payer: Self-pay | Admitting: Physician Assistant

## 2024-07-21 NOTE — Progress Notes (Signed)
 Left detailed message on secure line with Glade Potters, aftercare coordinator at [facility], with test results and providers recommendations.

## 2024-07-30 ENCOUNTER — Ambulatory Visit: Payer: MEDICAID | Attending: Orthopedic Surgery | Admitting: Rehabilitation

## 2024-07-30 ENCOUNTER — Other Ambulatory Visit: Payer: Self-pay

## 2024-07-30 ENCOUNTER — Encounter: Payer: Self-pay | Admitting: Rehabilitation

## 2024-07-30 DIAGNOSIS — M5442 Lumbago with sciatica, left side: Secondary | ICD-10-CM | POA: Insufficient documentation

## 2024-07-30 DIAGNOSIS — M5459 Other low back pain: Secondary | ICD-10-CM | POA: Insufficient documentation

## 2024-07-30 DIAGNOSIS — M6281 Muscle weakness (generalized): Secondary | ICD-10-CM | POA: Insufficient documentation

## 2024-07-30 DIAGNOSIS — M5441 Lumbago with sciatica, right side: Secondary | ICD-10-CM | POA: Diagnosis not present

## 2024-07-30 NOTE — Therapy (Signed)
 OUTPATIENT PHYSICAL THERAPY THORACOLUMBAR EVALUATION   Patient Name: Kaitlin Carpenter MRN: [id number] DOB:Apr 08, 1971, 53 y.o., female Today's Date: 07/30/2024  END OF SESSION:  PT End of Session - 07/30/24 1259     Visit Number 1    Date for Recertification  09/24/24    PT Start Time 1021    PT Stop Time 1059    PT Time Calculation (min) 38 min          Past Medical History:  Diagnosis Date   Anxiety    Essential hypertension 01/23/2023   Hepatitis C antibody positive    Past Surgical History:  Procedure Laterality Date   BREAST BIOPSY Left 07/16/2024   MM LT BREAST BX W LOC DEV EA AD LESION IMG BX SPEC STEREO GUIDE 07/16/2024 [facility]   BREAST BIOPSY Right 07/16/2024   MM RT BREAST BX W LOC DEV 1ST LESION IMAGE BX SPEC STEREO GUIDE 07/16/2024 [facility]   NECK SURGERY     SHOULDER SURGERY Left    Patient Active Problem List   Diagnosis Date Noted   Skin lesion 10/10/2023   Chronic hepatitis (HCC) 09/03/2023   Chest pain 07/31/2023   GAD (generalized anxiety disorder) 01/23/2023   Psychophysiological insomnia 01/23/2023   Acute right-sided low back pain with right-sided sciatica 01/23/2023   Essential hypertension 01/23/2023   Marijuana abuse 01/23/2023   Acute pancreatitis 12/19/2022   Acute alcoholic pancreatitis 12/19/2022   Hyponatremia 12/19/2022   Hypokalemia 12/19/2022   Tobacco abuse 12/19/2022   Melena 12/19/2022   Alcohol abuse 10/04/2020    PCP: Kaitlin Jeoffrey RAMAN, FNP   REFERRING PROVIDER: Georgina Ozell LABOR, MD   REFERRING DIAG: [phone] (ICD-10-CM) - Acute bilateral low back pain with bilateral sciatica  THERAPY DIAG:  Other low back pain  Muscle weakness (generalized)  RATIONALE FOR EVALUATION AND TREATMENT: Rehabilitation  ONSET DATE: many years  NEXT MD VISIT:    SUBJECTIVE:                                                                                                                                                                                                          SUBJECTIVE STATEMENT: 53 y/o patient referred to PT from Dr Georgina for LBP and B sciatica.   Patient reports having a long h/o chronic LBP for many years.   States she has worked a lot of physical jobs with heavy lifting requirements during her life.   States the pain has worsened over the last several months for no apparent reason.   Her Xrays show grade  1 spondylolisthesis at L4/5 and disc height loss, anterior osteophyte, and vacuum disc phenomenon at L5/S1.   Patient states the pain is worse with prolonged standing or walking and better with sitting or lying down.   She reports she gets shooting pains into posterior BLE down the buttock and thigh area.     She is currently not working and states she has tried but can't make it thru the workday due to the pain.   She resides at [facility] adult residential facility and has been there for the last 7 months.   PAIN: Are you having pain? Yes: NPRS scale: 4/10 now and average;   10/10 worst Pain location: central low back Pain description: constant ache, intermittently sharp Aggravating factors: prolonged standing;   when first sitting down Relieving factors: sitting for a while;  lying down  PERTINENT HISTORY:  HTN, alcoholic pancreatitis, chronic hepatitis, psychophysiological insomnia  PRECAUTIONS: None  RED FLAGS: None  WEIGHT BEARING RESTRICTIONS: No  FALLS:  Has patient fallen in last 6 months? No  LIVING ENVIRONMENT: Lives with: lives in adult residential facility Lives in: Group home Stairs: No Has following equipment at home: None  OCCUPATION: unemployed  PLOF: Independent  PATIENT GOALS: feel better in my back   OBJECTIVE: (objective measures completed at initial evaluation unless otherwise dated)  DIAGNOSTIC FINDINGS:  XRs of the lumbar spine from 07/13/2024 were independently reviewed and  interpreted, showing grade 1 spondylolisthesis at L4/5.  Disc height  loss  with anterior osteophyte formation at L5/S1.  Vacuum disc phenomenon at  L5/S1.  No other significant degenerative changes seen.  No fracture or  dislocation seen.    CLINICAL DATA:  Low back pain, right hip pain   EXAM: LUMBAR SPINE - COMPLETE 4+ VIEW   COMPARISON:  None.   FINDINGS: Five lumbar type vertebral bodies. No evidence of pars break. Vertebral body heights and alignment are maintained. There is disc space narrowing with endplate osteophytes at L5-S1. Lower lumbar facet hypertrophy.   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     Electronically Signed   By: Santina Blanch M.D.   On: 04/07/2021 13:12  PATIENT SURVEYS:  Modified Oswestry:  MODIFIED OSWESTRY DISABILITY SCALE  Date: 07/30/2024 Score  Pain intensity 4 =  Pain medication provides me with little relief from pain.  2. Personal care (washing, dressing, etc.) 1 =  I can take care of myself normally, but it increases my pain.  3. Lifting 2 = Pain prevents me from lifting heavy weights off the floor,but I can manage if the weights are conveniently positioned (e.g. on a table)  4. Walking 2 =  Pain prevents me from walking more than  mile.  5. Sitting 1 =  I can only sit in my favorite chair as long as I like.  6. Standing 2 =  Pain prevents me from standing more than 1 hour  7. Sleeping 1 = I can sleep well only by using pain medication.  8. Social Life 1 =  My social life is normal, but it increases my level of pain.  9. Traveling 1 =  I can travel anywhere, but it increases my pain.  10. Employment/ Homemaking 1 = My normal homemaking/job activities increase my pain, but I can still perform all that is required of me  Total 16/50 = 32%   Interpretation of scores: Score Category Description  0-20% Minimal Disability The patient can cope with most living activities. Usually no treatment is indicated  apart from advice on lifting, sitting and exercise  21-40% Moderate Disability The patient  experiences more pain and difficulty with sitting, lifting and standing. Travel and social life are more difficult and they may be disabled from work. Personal care, sexual activity and sleeping are not grossly affected, and the patient can usually be managed by conservative means  41-60% Severe Disability Pain remains the main problem in this group, but activities of daily living are affected. These patients require a detailed investigation  61-80% Crippled Back pain impinges on all aspects of the patient's life. Positive intervention is required  81-100% Bed-bound These patients are either bed-bound or exaggerating their symptoms  Bluford FORBES Zoe DELENA Karon DELENA, et al. Surgery versus conservative management of stable thoracolumbar fracture: the PRESTO feasibility RCT. [place] ([place]): [employer]; 2021 Nov. [facility] Technology Assessment, No. 25.62.) Appendix 3, Oswestry Disability Index category descriptors. Available from: Findjewelers.cz  Minimally Clinically Important Difference (MCID) = 12.8%  SCREENING FOR RED FLAGS: Bowel or bladder incontinence: No Spinal tumors: No Cauda equina syndrome: No Compression fracture: No Abdominal aneurysm: No  COGNITION:  Overall cognitive status: Within functional limits for tasks assessed    SENSATION: WFL  POSTURE:       increased lumbar lordosis;  a little sway backed;   level pelvic landmarks in standing and in supine and in prone  PALPATION: TTP over lumbar spine generally;  no tenderness in either hip  LUMBAR ROM:   Active  Eval  Flexion 90% to toes  Extension 90%; end range pain  Right lateral flexion To distal thigh  Left lateral flexion To distal thigh  Right rotation 75%; pain  Left rotation 75%; pain  (Blank rows = not tested)  MUSCLE LENGTH: Hamstrings: Right SLR 80  deg; Left SLR = 90 deg Thomas test: Right WNL deg; Left WNL deg Hamstrings: not tight ITB: not tight Piriformis: good  flexibility; not tight Hip flexors: not tight Quads: NT Heelcord: NT  LOWER EXTREMITY ROM:     Active  Right eval Left eval  Hip flexion    Hip extension    Hip abduction    Hip adduction    Hip internal rotation    Hip external rotation    Knee flexion    Knee extension    Ankle dorsiflexion    Ankle plantarflexion    Ankle inversion    Ankle eversion    (Blank rows = not tested)  LOWER EXTREMITY MMT:    MMT Right eval Left eval  Hip flexion 5 5  Hip extension 3+ 4-  Hip abduction 4 4-  Hip adduction    Hip internal rotation 4+ 4+  Hip external rotation 5 5  Knee flexion 5 5  Knee extension 5 5  Ankle dorsiflexion 5 5  Ankle plantarflexion    Ankle inversion    Ankle eversion     (Blank rows = not tested)  LUMBAR SPECIAL TESTS:  Straight leg raise test: Negative, Slump test: Negative, SI Compression/distraction test: Negative, FABER test: Negative, Gaenslen's test: Negative, and Thomas test: Negative   L1-5 P/A glides and rotational mobilizations each direction feel pretty normal and maybe a little decreased  FUNCTIONAL TESTS:  N/a  GAIT: Distance walked: into clinic Assistive device utilized: None Level of assistance: Complete Independence Gait pattern: WFL;  somewhat varus knees R>L;  slight unsteadiness Comments:    TODAY'S TREATMENT:  07/30/24 SELF CARE: Provided education on PT POC progression.and initial HEP;   avoiding sit ups and  painful yoga positions with extension and/or rotation.   Doing ab curl ups only for core strength for now   PATIENT EDUCATION:  Education details: PT eval findings, anticipated POC, and initial HEP  Person educated: Patient Education method: Explanation, Demonstration, Verbal cues, Tactile cues, Handouts, and MedBridgeGO app access provided Education comprehension: verbalized understanding, verbal cues required, tactile cues required, and needs further education  HOME EXERCISE PROGRAM: Access Code: [email or web address] URL:  [email or web address] Date: 07/30/2024 Prepared by: Garnette Montclair  Exercises - Supine Bridge  - 1 x daily - 7 x weekly - 3 sets - 10 reps - Prone Hip Extension  - 1 x daily - 7 x weekly - 3 sets - 10 reps - Sidelying Hip Abduction  - 1 x daily - 7 x weekly - 2 sets - 10 reps - Hooklying Single Knee to Chest Stretch  - 1 x daily - 7 x weekly - 1 sets - 10 reps - 5 sec hold - Supine Double Knee to Chest  - 1 x daily - 7 x weekly - 1 sets - 10 reps - 5 sec hold - Curl Up with Reach  - 1 x daily - 7 x weekly - 2 sets - 10 reps - Diagonal Curl Up with Reach  - 1 x daily - 7 x weekly - 2 sets - 10 reps   ASSESSMENT:  CLINICAL IMPRESSION: Kaitlin Carpenter is a 53 y.o. female who was referred to physical therapy for evaluation and treatment for acute LBP w/ B sciatica.     Patient reports onset of LBP/sciatica pain beginning many years ago.    Pain has recently worsened over the last few months for no apparent reason.   She does do yoga and sit ups at home and admits that these exercises can be painful and is advised to avoid sit ups and yoga movements involving rotation and/or extension of the low back. Pain is worse with prolonged standing and walker;  better with sitting and lying down.    She has moderate weakness in her hip abductors and extensors, but pretty good flexibility overall.  Patient has deficits in lumbar ROM,  hip and back strength/stability, abnormal posture, and low back/BLE pain which are interfering with ADLs and are impacting quality of life.  On Modified Oswestry patient scored 16/50 demonstrating 32% or moderate disability.  Lamaria will benefit from skilled PT to address above deficits to improve mobility and activity tolerance with decreased pain interference.     OBJECTIVE IMPAIRMENTS: difficulty walking, decreased ROM, decreased strength, postural dysfunction, and pain.   ACTIVITY LIMITATIONS: carrying, lifting, bending, standing, squatting, sleeping, and  locomotion level  PARTICIPATION LIMITATIONS: meal prep, cleaning, laundry, shopping, and community activity  PERSONAL FACTORS: Fitness, Time since onset of injury/illness/exacerbation, and 1-2 comorbidities: HTN, alcoholic pancreatitis, chronic hepatitis, psychophysiological insomnia are also affecting patient's functional outcome.   REHAB POTENTIAL: Good  CLINICAL DECISION MAKING: Evolving/moderate complexity  EVALUATION COMPLEXITY: Moderate   GOALS: Goals reviewed with patient? Yes  SHORT TERM GOALS: Target date: 08/27/2024   Patient will be independent with initial HEP to improve outcomes and carryover.  Baseline: 100% PT assist required for correct completion Goal status: INITIAL  2.  Patient will report 25% improvement in low back pain to improve QOL. Baseline: 10/10 worst Goal status: INITIAL  3.  Patient will improve lumbar ROM to 75% of normal in all planes Baseline: see ROM tables above Goal status: INITIAL   LONG TERM GOALS: Target date: 09/24/2024  Patient will be independent with ongoing/advanced HEP for self-management at home.  Baseline: no advanced HEP yet Goal status: INITIAL  2.  Patient will report 50-75% improvement in low back pain to improve QOL.  Baseline: 10/10 worst Goal status: INITIAL   4.  Patient will demonstrate full pain free lumbar ROM to perform ADLs.   Baseline: Refer to above lumbar ROM table Goal status: INITIAL  5.  Patient will demonstrate improved BLE strength to >/= 5/5 for improved stability and ease of mobility. Baseline: Refer to above LE MMT table Goal status: INITIAL  6. Patient will report </= 20% on Modified Oswestry (MCID = 12%) to demonstrate improved functional ability with decreased pain interference. Baseline: 32 Goal status: INITIAL  7.  Patient will tolerate 60 min of (standing/sitting/walking) w/o increased pain to allow for  improved mobility and activity tolerance. Baseline: limited to 20-30 minutes per  patient report Goal status: INITIAL  8.  Patient will report centralization of radicular symptoms.  Baseline: reports radiation into BLE to distal thighs Goal status: INITIAL   PLAN:  PT FREQUENCY: 1-2x/week  PT DURATION: 8 weeks  PLANNED INTERVENTIONS: 97164- PT Re-evaluation, 97110-Therapeutic exercises, 97530- Therapeutic activity, 97112- Neuromuscular re-education, 97535- Self Care, [id number]- Manual therapy, [id number]- Electrical stimulation (unattended), 20560 (1-2 muscles), 20561 (3+ muscles)- Dry Needling, Patient/Family education, Taping, Joint mobilization, Spinal mobilization, Cryotherapy, and Moist heat  PLAN FOR NEXT SESSION: see how HEP is doing, add progressive lumbar stabilization activities for spondylolisthesis mgmt;  tape/heat/MFR PRN   Beuna Bolding, PT 07/30/2024, 1:00 PM

## 2024-08-05 ENCOUNTER — Ambulatory Visit: Payer: MEDICAID

## 2024-08-10 ENCOUNTER — Ambulatory Visit: Payer: MEDICAID | Attending: Orthopedic Surgery

## 2024-08-10 DIAGNOSIS — M5459 Other low back pain: Secondary | ICD-10-CM | POA: Diagnosis present

## 2024-08-10 DIAGNOSIS — M6281 Muscle weakness (generalized): Secondary | ICD-10-CM | POA: Diagnosis present

## 2024-08-10 NOTE — Therapy (Signed)
 OUTPATIENT PHYSICAL THERAPY THORACOLUMBAR TREATMET   Patient Name: Kaitlin Carpenter MRN: [id number] DOB:May 19, 1971, 53 y.o., female Today's Date: 08/10/2024  END OF SESSION:  PT End of Session - 08/10/24 1439     Visit Number 2    Date for Recertification  09/24/24    PT Start Time 1401    PT Stop Time 1446    PT Time Calculation (min) 45 min           Past Medical History:  Diagnosis Date   Anxiety    Essential hypertension 01/23/2023   Hepatitis C antibody positive    Past Surgical History:  Procedure Laterality Date   BREAST BIOPSY Left 07/16/2024   MM LT BREAST BX W LOC DEV EA AD LESION IMG BX SPEC STEREO GUIDE 07/16/2024 [facility]   BREAST BIOPSY Right 07/16/2024   MM RT BREAST BX W LOC DEV 1ST LESION IMAGE BX SPEC STEREO GUIDE 07/16/2024 [facility]   NECK SURGERY     SHOULDER SURGERY Left    Patient Active Problem List   Diagnosis Date Noted   Skin lesion 10/10/2023   Chronic hepatitis (HCC) 09/03/2023   Chest pain 07/31/2023   GAD (generalized anxiety disorder) 01/23/2023   Psychophysiological insomnia 01/23/2023   Acute right-sided low back pain with right-sided sciatica 01/23/2023   Essential hypertension 01/23/2023   Marijuana abuse 01/23/2023   Acute pancreatitis 12/19/2022   Acute alcoholic pancreatitis 12/19/2022   Hyponatremia 12/19/2022   Hypokalemia 12/19/2022   Tobacco abuse 12/19/2022   Melena 12/19/2022   Alcohol abuse 10/04/2020    PCP: Kayla Jeoffrey RAMAN, FNP   REFERRING PROVIDER: Georgina Ozell LABOR, MD   REFERRING DIAG: [phone] (ICD-10-CM) - Acute bilateral low back pain with bilateral sciatica  THERAPY DIAG:  Other low back pain  Muscle weakness (generalized)  RATIONALE FOR EVALUATION AND TREATMENT: Rehabilitation  ONSET DATE: many years  NEXT MD VISIT:    SUBJECTIVE:                                                                                                                                                                                                          SUBJECTIVE STATEMENT: 53 y/o patient referred to PT from Dr Georgina for LBP and B sciatica.   Patient reports having a long h/o chronic LBP for many years.   States she has worked a lot of physical jobs with heavy lifting requirements during her life.   States the pain has worsened over the last several months for no apparent reason.   Her Xrays show  grade 1 spondylolisthesis at L4/5 and disc height loss, anterior osteophyte, and vacuum disc phenomenon at L5/S1.   Patient states the pain is worse with prolonged standing or walking and better with sitting or lying down.   She reports she gets shooting pains into posterior BLE down the buttock and thigh area.     She is currently not working and states she has tried but can't make it thru the workday due to the pain.   She resides at [facility] adult residential facility and has been there for the last 7 months.   PAIN: Are you having pain? Yes: NPRS scale: 4/10 now and average;  10/10 worst Pain location: central low back Pain description: constant ache, intermittently sharp Aggravating factors: prolonged standing;   when first sitting down Relieving factors: sitting for a while;  lying down  PERTINENT HISTORY:  HTN, alcoholic pancreatitis, chronic hepatitis, psychophysiological insomnia  PRECAUTIONS: None  RED FLAGS: None  WEIGHT BEARING RESTRICTIONS: No  FALLS:  Has patient fallen in last 6 months? No  LIVING ENVIRONMENT: Lives with: lives in adult residential facility Lives in: Group home Stairs: No Has following equipment at home: None  OCCUPATION: unemployed  PLOF: Independent  PATIENT GOALS: feel better in my back   OBJECTIVE: (objective measures completed at initial evaluation unless otherwise dated)  DIAGNOSTIC FINDINGS:  XRs of the lumbar spine from 07/13/2024 were independently reviewed and  interpreted, showing grade 1 spondylolisthesis at L4/5.  Disc height  loss  with anterior osteophyte formation at L5/S1.  Vacuum disc phenomenon at  L5/S1.  No other significant degenerative changes seen.  No fracture or  dislocation seen.    CLINICAL DATA:  Low back pain, right hip pain   EXAM: LUMBAR SPINE - COMPLETE 4+ VIEW   COMPARISON:  None.   FINDINGS: Five lumbar type vertebral bodies. No evidence of pars break. Vertebral body heights and alignment are maintained. There is disc space narrowing with endplate osteophytes at L5-S1. Lower lumbar facet hypertrophy.   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     Electronically Signed   By: Santina Blanch M.D.   On: 04/07/2021 13:12  PATIENT SURVEYS:  Modified Oswestry:  MODIFIED OSWESTRY DISABILITY SCALE  Date: 08/10/2024 Score  Pain intensity 4 =  Pain medication provides me with little relief from pain.  2. Personal care (washing, dressing, etc.) 1 =  I can take care of myself normally, but it increases my pain.  3. Lifting 2 = Pain prevents me from lifting heavy weights off the floor,but I can manage if the weights are conveniently positioned (e.g. on a table)  4. Walking 2 =  Pain prevents me from walking more than  mile.  5. Sitting 1 =  I can only sit in my favorite chair as long as I like.  6. Standing 2 =  Pain prevents me from standing more than 1 hour  7. Sleeping 1 = I can sleep well only by using pain medication.  8. Social Life 1 =  My social life is normal, but it increases my level of pain.  9. Traveling 1 =  I can travel anywhere, but it increases my pain.  10. Employment/ Homemaking 1 = My normal homemaking/job activities increase my pain, but I can still perform all that is required of me  Total 16/50 = 32%   Interpretation of scores: Score Category Description  0-20% Minimal Disability The patient can cope with most living activities. Usually no treatment is indicated  apart from advice on lifting, sitting and exercise  21-40% Moderate Disability The patient  experiences more pain and difficulty with sitting, lifting and standing. Travel and social life are more difficult and they may be disabled from work. Personal care, sexual activity and sleeping are not grossly affected, and the patient can usually be managed by conservative means  41-60% Severe Disability Pain remains the main problem in this group, but activities of daily living are affected. These patients require a detailed investigation  61-80% Crippled Back pain impinges on all aspects of the patient's life. Positive intervention is required  81-100% Bed-bound These patients are either bed-bound or exaggerating their symptoms  Bluford FORBES Zoe DELENA Karon DELENA, et al. Surgery versus conservative management of stable thoracolumbar fracture: the PRESTO feasibility RCT. [place] ([place]): [employer]; 2021 Nov. [facility] Technology Assessment, No. 25.62.) Appendix 3, Oswestry Disability Index category descriptors. Available from: Findjewelers.cz  Minimally Clinically Important Difference (MCID) = 12.8%  SCREENING FOR RED FLAGS: Bowel or bladder incontinence: No Spinal tumors: No Cauda equina syndrome: No Compression fracture: No Abdominal aneurysm: No  COGNITION:  Overall cognitive status: Within functional limits for tasks assessed    SENSATION: WFL  POSTURE:       increased lumbar lordosis;  a little sway backed;   level pelvic landmarks in standing and in supine and in prone  PALPATION: TTP over lumbar spine generally;  no tenderness in either hip  LUMBAR ROM:   Active  Eval  Flexion 90% to toes  Extension 90%; end range pain  Right lateral flexion To distal thigh  Left lateral flexion To distal thigh  Right rotation 75%; pain  Left rotation 75%; pain  (Blank rows = not tested)  MUSCLE LENGTH: Hamstrings: Right SLR 80  deg; Left SLR = 90 deg Thomas test: Right WNL deg; Left WNL deg Hamstrings: not tight ITB: not tight Piriformis: good  flexibility; not tight Hip flexors: not tight Quads: NT Heelcord: NT  LOWER EXTREMITY ROM:     Active  Right eval Left eval  Hip flexion    Hip extension    Hip abduction    Hip adduction    Hip internal rotation    Hip external rotation    Knee flexion    Knee extension    Ankle dorsiflexion    Ankle plantarflexion    Ankle inversion    Ankle eversion    (Blank rows = not tested)  LOWER EXTREMITY MMT:    MMT Right eval Left eval  Hip flexion 5 5  Hip extension 3+ 4-  Hip abduction 4 4-  Hip adduction    Hip internal rotation 4+ 4+  Hip external rotation 5 5  Knee flexion 5 5  Knee extension 5 5  Ankle dorsiflexion 5 5  Ankle plantarflexion    Ankle inversion    Ankle eversion     (Blank rows = not tested)  LUMBAR SPECIAL TESTS:  Straight leg raise test: Negative, Slump test: Negative, SI Compression/distraction test: Negative, FABER test: Negative, Gaenslen's test: Negative, and Thomas test: Negative   L1-5 P/A glides and rotational mobilizations each direction feel pretty normal and maybe a little decreased  FUNCTIONAL TESTS:  N/a  GAIT: Distance walked: into clinic Assistive device utilized: None Level of assistance: Complete Independence Gait pattern: WFL;  somewhat varus knees R>L;  slight unsteadiness Comments:    TODAY'S TREATMENT:  08/10/24 Nustep L5x76min Prone foam roll to low back for IASTM Prone knee flexion x 10 B  Supine SKTC BLE x 1 min Supine figure 4 stretch with B hip flexion x 1 min Curl ups  S/L hip abduction  Prone hip ext Karolynn pose x 1 min 3 ways Seated ab sets orange pball 10x3'  07/30/24 SELF CARE: Provided education on PT POC progression.and initial HEP;   avoiding sit ups and painful yoga positions with extension and/or rotation.   Doing ab curl ups only for core strength for now    PATIENT EDUCATION:  Education details: PT eval findings, anticipated POC, and initial HEP  Person educated: Patient Education method:  Explanation, Demonstration, Verbal cues, Tactile cues, Handouts, and MedBridgeGO app access provided Education comprehension: verbalized understanding, verbal cues required, tactile cues required, and needs further education  HOME EXERCISE PROGRAM: Access Code: [email or web address] URL: [email or web address] Date: 07/30/2024 Prepared by: Garnette Montclair  Exercises - Supine Bridge  - 1 x daily - 7 x weekly - 3 sets - 10 reps - Prone Hip Extension  - 1 x daily - 7 x weekly - 3 sets - 10 reps - Sidelying Hip Abduction  - 1 x daily - 7 x weekly - 2 sets - 10 reps - Hooklying Single Knee to Chest Stretch  - 1 x daily - 7 x weekly - 1 sets - 10 reps - 5 sec hold - Supine Double Knee to Chest  - 1 x daily - 7 x weekly - 1 sets - 10 reps - 5 sec hold - Curl Up with Reach  - 1 x daily - 7 x weekly - 2 sets - 10 reps - Diagonal Curl Up with Reach  - 1 x daily - 7 x weekly - 2 sets - 10 reps   ASSESSMENT:  CLINICAL IMPRESSION: We did an HEP review today to ensure independence at home. We progressed stretches for lumbar flexion and core activation exercises to reduce lordosis and strain on lumbar spine. Pt with good response to treatment, will continue lumbar stab and stretching.     Kaitlin Carpenter is a 53 y.o. female who was referred to physical therapy for evaluation and treatment for acute LBP w/ B sciatica.     Patient reports onset of LBP/sciatica pain beginning many years ago.    Pain has recently worsened over the last few months for no apparent reason.   She does do yoga and sit ups at home and admits that these exercises can be painful and is advised to avoid sit ups and yoga movements involving rotation and/or extension of the low back. Pain is worse with prolonged standing and walker;  better with sitting and lying down.    She has moderate weakness in her hip abductors and extensors, but pretty good flexibility overall.  Patient has deficits in lumbar ROM,  hip and back  strength/stability, abnormal posture, and low back/BLE pain which are interfering with ADLs and are impacting quality of life.  On Modified Oswestry patient scored 16/50 demonstrating 32% or moderate disability.  Denine will benefit from skilled PT to address above deficits to improve mobility and activity tolerance with decreased pain interference.     OBJECTIVE IMPAIRMENTS: difficulty walking, decreased ROM, decreased strength, postural dysfunction, and pain.   ACTIVITY LIMITATIONS: carrying, lifting, bending, standing, squatting, sleeping, and locomotion level  PARTICIPATION LIMITATIONS: meal prep, cleaning, laundry, shopping, and community activity  PERSONAL FACTORS: Fitness, Time since onset of injury/illness/exacerbation, and 1-2 comorbidities: HTN, alcoholic pancreatitis, chronic hepatitis, psychophysiological insomnia are also affecting patient's functional outcome.   REHAB POTENTIAL: Good  CLINICAL DECISION MAKING: Evolving/moderate complexity  EVALUATION COMPLEXITY: Moderate   GOALS: Goals reviewed with patient? Yes  SHORT TERM GOALS: Target date: 08/27/2024   Patient will be independent with initial HEP to improve outcomes and carryover.  Baseline: 100% PT assist required for correct completion Goal status: INITIAL  2.  Patient will report 25% improvement in low back pain to improve QOL. Baseline: 10/10 worst Goal status: INITIAL  3.  Patient will improve lumbar ROM to 75% of normal in all planes Baseline: see ROM tables above Goal status: INITIAL   LONG TERM GOALS: Target date: 09/24/2024   Patient will be independent with ongoing/advanced HEP for self-management at home.  Baseline: no advanced HEP yet Goal status: INITIAL  2.  Patient will report 50-75% improvement in low back pain to improve QOL.  Baseline: 10/10 worst Goal status: INITIAL   4.  Patient will demonstrate full pain free lumbar ROM to perform ADLs.   Baseline: Refer to above lumbar ROM  table Goal status: INITIAL  5.  Patient will demonstrate improved BLE strength to >/= 5/5 for improved stability and ease of mobility. Baseline: Refer to above LE MMT table Goal status: INITIAL  6. Patient will report </= 20% on Modified Oswestry (MCID = 12%) to demonstrate improved functional ability with decreased pain interference. Baseline: 32 Goal status: INITIAL  7.  Patient will tolerate 60 min of (standing/sitting/walking) w/o increased pain to allow for  improved mobility and activity tolerance. Baseline: limited to 20-30 minutes per patient report Goal status: INITIAL  8.  Patient will report centralization of radicular symptoms.  Baseline: reports radiation into BLE to distal thighs Goal status: INITIAL   PLAN:  PT FREQUENCY: 1-2x/week  PT DURATION: 8 weeks  PLANNED INTERVENTIONS: 02835- PT Re-evaluation, 97110-Therapeutic exercises, 97530- Therapeutic activity, 97112- Neuromuscular re-education, 97535- Self Care, 02859- Manual therapy, [id number]- Electrical stimulation (unattended), 20560 (1-2 muscles), 20561 (3+ muscles)- Dry Needling, Patient/Family education, Taping, Joint mobilization, Spinal mobilization, Cryotherapy, and Moist heat  PLAN FOR NEXT SESSION: add progressive lumbar stabilization activities for spondylolisthesis mgmt;   Sol LITTIE Gaskins, PTA 08/10/2024, 2:58 PM

## 2024-08-13 ENCOUNTER — Encounter: Payer: Self-pay | Admitting: Rehabilitation

## 2024-08-13 ENCOUNTER — Ambulatory Visit: Payer: MEDICAID | Admitting: Rehabilitation

## 2024-08-13 DIAGNOSIS — M5459 Other low back pain: Secondary | ICD-10-CM | POA: Diagnosis not present

## 2024-08-13 DIAGNOSIS — M6281 Muscle weakness (generalized): Secondary | ICD-10-CM

## 2024-08-13 NOTE — Therapy (Signed)
 OUTPATIENT PHYSICAL THERAPY THORACOLUMBAR TREATMET   Patient Name: Kaitlin Carpenter MRN: [id number] DOB:[birth year], 53 y.o., female Today's Date: 08/13/2024  END OF SESSION:  PT End of Session - 08/13/24 1402     Visit Number 3    Date for Recertification  09/24/24    PT Start Time 1403    PT Stop Time 1444    PT Time Calculation (min) 41 min           Past Medical History:  Diagnosis Date   Anxiety    Essential hypertension 01/23/2023   Hepatitis C antibody positive    Past Surgical History:  Procedure Laterality Date   BREAST BIOPSY Left 07/16/2024   MM LT BREAST BX W LOC DEV EA AD LESION IMG BX SPEC STEREO GUIDE 07/16/2024 [facility]   BREAST BIOPSY Right 07/16/2024   MM RT BREAST BX W LOC DEV 1ST LESION IMAGE BX SPEC STEREO GUIDE 07/16/2024 [facility]   NECK SURGERY     SHOULDER SURGERY Left    Patient Active Problem List   Diagnosis Date Noted   Skin lesion 10/10/2023   Chronic hepatitis (HCC) 09/03/2023   Chest pain 07/31/2023   GAD (generalized anxiety disorder) 01/23/2023   Psychophysiological insomnia 01/23/2023   Acute right-sided low back pain with right-sided sciatica 01/23/2023   Essential hypertension 01/23/2023   Marijuana abuse 01/23/2023   Acute pancreatitis 12/19/2022   Acute alcoholic pancreatitis 12/19/2022   Hyponatremia 12/19/2022   Hypokalemia 12/19/2022   Tobacco abuse 12/19/2022   Melena 12/19/2022   Alcohol abuse 10/04/2020    PCP: Kayla Jeoffrey RAMAN, FNP   REFERRING PROVIDER: Georgina Ozell LABOR, MD   REFERRING DIAG: [id number] (ICD-10-CM) - Acute bilateral low back pain with bilateral sciatica  THERAPY DIAG:  Other low back pain  Muscle weakness (generalized)  RATIONALE FOR EVALUATION AND TREATMENT: Rehabilitation  ONSET DATE: many years  NEXT MD VISIT:    SUBJECTIVE:                                                                                                                                                                                                          SUBJECTIVE STATEMENT: Patient reports feels about the same.   Rates pain 7-8/10 in the low back.  States worse as the day goes on.   Tight in the AM.    EVAL:  53 y/o patient referred to PT from Dr Georgina for LBP and B sciatica.   Patient reports having a long h/o chronic LBP for many years.   States she has  worked a lot of physical jobs with heavy lifting requirements during her life.   States the pain has worsened over the last several months for no apparent reason.   Her Xrays show grade 1 spondylolisthesis at L4/5 and disc height loss, anterior osteophyte, and vacuum disc phenomenon at L5/S1.   Patient states the pain is worse with prolonged standing or walking and better with sitting or lying down.   She reports she gets shooting pains into posterior BLE down the buttock and thigh area.     She is currently not working and states she has tried but can't make it thru the workday due to the pain.   She resides at [facility] adult residential facility and has been there for the last 7 months.   PAIN: Are you having pain? Yes: NPRS scale: 4/10 now and average;  10/10 worst Pain location: central low back Pain description: constant ache, intermittently sharp Aggravating factors: prolonged standing;   when first sitting down Relieving factors: sitting for a while;  lying down  PERTINENT HISTORY:  HTN, alcoholic pancreatitis, chronic hepatitis, psychophysiological insomnia  PRECAUTIONS: None  RED FLAGS: None  WEIGHT BEARING RESTRICTIONS: No  FALLS:  Has patient fallen in last 6 months? No  LIVING ENVIRONMENT: Lives with: lives in adult residential facility Lives in: Group home Stairs: No Has following equipment at home: None  OCCUPATION: unemployed  PLOF: Independent  PATIENT GOALS: feel better in my back   OBJECTIVE: (objective measures completed at initial evaluation unless otherwise dated)  DIAGNOSTIC FINDINGS:  XRs of  the lumbar spine from 07/13/2024 were independently reviewed and  interpreted, showing grade 1 spondylolisthesis at L4/5.  Disc height loss  with anterior osteophyte formation at L5/S1.  Vacuum disc phenomenon at  L5/S1.  No other significant degenerative changes seen.  No fracture or  dislocation seen.    CLINICAL DATA:  Low back pain, right hip pain   EXAM: LUMBAR SPINE - COMPLETE 4+ VIEW   COMPARISON:  None.   FINDINGS: Five lumbar type vertebral bodies. No evidence of pars break. Vertebral body heights and alignment are maintained. There is disc space narrowing with endplate osteophytes at L5-S1. Lower lumbar facet hypertrophy.   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     Electronically Signed   By: Santina Blanch M.D.   On: 04/07/2021 13:12  PATIENT SURVEYS:  Modified Oswestry:  MODIFIED OSWESTRY DISABILITY SCALE  Date: 08/13/2024 Score  Pain intensity 4 =  Pain medication provides me with little relief from pain.  2. Personal care (washing, dressing, etc.) 1 =  I can take care of myself normally, but it increases my pain.  3. Lifting 2 = Pain prevents me from lifting heavy weights off the floor,but I can manage if the weights are conveniently positioned (e.g. on a table)  4. Walking 2 =  Pain prevents me from walking more than  mile.  5. Sitting 1 =  I can only sit in my favorite chair as long as I like.  6. Standing 2 =  Pain prevents me from standing more than 1 hour  7. Sleeping 1 = I can sleep well only by using pain medication.  8. Social Life 1 =  My social life is normal, but it increases my level of pain.  9. Traveling 1 =  I can travel anywhere, but it increases my pain.  10. Employment/ Homemaking 1 = My normal homemaking/job activities increase my pain, but I can still perform all that  is required of me  Total 16/50 = 32%   Interpretation of scores: Score Category Description  0-20% Minimal Disability The patient can cope with most living  activities. Usually no treatment is indicated apart from advice on lifting, sitting and exercise  21-40% Moderate Disability The patient experiences more pain and difficulty with sitting, lifting and standing. Travel and social life are more difficult and they may be disabled from work. Personal care, sexual activity and sleeping are not grossly affected, and the patient can usually be managed by conservative means  41-60% Severe Disability Pain remains the main problem in this group, but activities of daily living are affected. These patients require a detailed investigation  61-80% Crippled Back pain impinges on all aspects of the patient's life. Positive intervention is required  81-100% Bed-bound These patients are either bed-bound or exaggerating their symptoms  Bluford FORBES Zoe DELENA Karon DELENA, et al. Surgery versus conservative management of stable thoracolumbar fracture: the PRESTO feasibility RCT. [place] ([place]): [employer]; 2021 Nov. [facility] Technology Assessment, No. 25.62.) Appendix 3, Oswestry Disability Index category descriptors. Available from: Findjewelers.cz  Minimally Clinically Important Difference (MCID) = 12.8%  SCREENING FOR RED FLAGS: Bowel or bladder incontinence: No Spinal tumors: No Cauda equina syndrome: No Compression fracture: No Abdominal aneurysm: No  COGNITION:  Overall cognitive status: Within functional limits for tasks assessed    SENSATION: WFL  POSTURE:       increased lumbar lordosis;  a little sway backed;   level pelvic landmarks in standing and in supine and in prone  PALPATION: TTP over lumbar spine generally;  no tenderness in either hip  LUMBAR ROM:   Active  Eval  Flexion 90% to toes  Extension 90%; end range pain  Right lateral flexion To distal thigh  Left lateral flexion To distal thigh  Right rotation 75%; pain  Left rotation 75%; pain  (Blank rows = not tested)  MUSCLE  LENGTH: Hamstrings: Right SLR 80  deg; Left SLR = 90 deg Thomas test: Right WNL deg; Left WNL deg Hamstrings: not tight ITB: not tight Piriformis: good flexibility; not tight Hip flexors: not tight Quads: NT Heelcord: NT  LOWER EXTREMITY ROM:     Active  Right eval Left eval  Hip flexion    Hip extension    Hip abduction    Hip adduction    Hip internal rotation    Hip external rotation    Knee flexion    Knee extension    Ankle dorsiflexion    Ankle plantarflexion    Ankle inversion    Ankle eversion    (Blank rows = not tested)  LOWER EXTREMITY MMT:    MMT Right eval Left eval  Hip flexion 5 5  Hip extension 3+ 4-  Hip abduction 4 4-  Hip adduction    Hip internal rotation 4+ 4+  Hip external rotation 5 5  Knee flexion 5 5  Knee extension 5 5  Ankle dorsiflexion 5 5  Ankle plantarflexion    Ankle inversion    Ankle eversion     (Blank rows = not tested)  LUMBAR SPECIAL TESTS:  Straight leg raise test: Negative, Slump test: Negative, SI Compression/distraction test: Negative, FABER test: Negative, Gaenslen's test: Negative, and Thomas test: Negative   L1-5 P/A glides and rotational mobilizations each direction feel pretty normal and maybe a little decreased  FUNCTIONAL TESTS:  N/a  GAIT: Distance walked: into clinic Assistive device utilized: None Level of assistance: Complete Independence Gait  pattern: WFL;  somewhat varus knees R>L;  slight unsteadiness Comments:    TODAY'S TREATMENT:  08/13/24 THERAPEUTIC EXERCISE: To improve strength and endurance.  Demonstration, verbal and tactile cues throughout for technique. NuStep L5 x 7'  THERAPEUTIC ACTIVITIES: To improve functional performance.  Demonstration, verbal and tactile cues throughout for technique. Supine 55 cm swiss ball:  Knee flexion rollouts x 20   Legs straight/ knees side to side x 20 BLE  90/90 bridging  90/90 knees side to side x 20 BLE Supine PPT x 20 w/ 3 sec holds    NEUROMUSCULAR RE-EDUCATION: To improve coordination, kinesthesia, posture, and proprioception. Seated on table 65 swiss ball rollouts F/S/S x 20 each Seated palloff press  Seated on 75 cm swiss ball:  Bouncing EO x 1', EC x 1'  Marching x 20 BLE  LAQ x 1' BLE  Alternate UE/LE raises x 1' BUE/BLE Side planks from knees x 5 each direction  08/10/24 Nustep L5x40min Prone foam roll to low back for IASTM Prone knee flexion x 10 B Supine SKTC BLE x 1 min Supine figure 4 stretch with B hip flexion x 1 min Curl ups  S/L hip abduction  Prone hip ext Karolynn pose x 1 min 3 ways Seated ab sets orange pball 10x3'  07/30/24 SELF CARE: Provided education on PT POC progression.and initial HEP;   avoiding sit ups and painful yoga positions with extension and/or rotation.   Doing ab curl ups only for core strength for now    PATIENT EDUCATION:  Education details: HEP review and HEP update - medbridge updates  Person educated: Patient Education method: Explanation, Demonstration, Verbal cues, Tactile cues, Handouts, and MedBridgeGO app access provided Education comprehension: verbalized understanding, verbal cues required, tactile cues required, and needs further education  HOME EXERCISE PROGRAM: Access Code: [email or web address] URL: [email or web address] Date: 08/13/2024 Prepared by: Garnette Montclair  Exercises - Supine Bridge  - 1 x daily - 7 x weekly - 3 sets - 10 reps - Prone Hip Extension  - 1 x daily - 7 x weekly - 3 sets - 10 reps - Hooklying Single Knee to Chest Stretch  - 1 x daily - 7 x weekly - 1 sets - 10 reps - 5 sec hold - Supine Double Knee to Chest  - 1 x daily - 7 x weekly - 1 sets - 10 reps - 5 sec hold - Curl Up with Reach  - 1 x daily - 7 x weekly - 2 sets - 10 reps - Diagonal Curl Up with Reach  - 1 x daily - 7 x weekly - 2 sets - 10 reps - Side Plank on Knees  - 1 x daily - 7 x weekly - 3 sets - 10 reps - Supine Posterior Pelvic Tilt  - 1 x daily - 7 x weekly -  3 sets - 10 reps - Sidelying Fire Hydrant  - 1 x daily - 7 x weekly - 3 sets - 10 reps  ASSESSMENT:  CLINICAL IMPRESSION: Patient states she is not seeing any improvement in her LBP yet, but is encouraged to give more time as we advance her exercises to see if improvement can occur.   HEP is advanced today and medbridge is updated.   She is advised not to do any yoga or other stretching except for the exercises that we have given her for now.  She has difficulty with stabilization exercises on the swiss ball evidencing core strength deficits.  PT remains necessary for  balance, strength, ROM, HEP deficits.  Continue per POC  BRITANEY ESPAILLAT is a 53 y.o. female who was referred to physical therapy for evaluation and treatment for acute LBP w/ B sciatica.     Patient reports onset of LBP/sciatica pain beginning many years ago.    Pain has recently worsened over the last few months for no apparent reason.   She does do yoga and sit ups at home and admits that these exercises can be painful and is advised to avoid sit ups and yoga movements involving rotation and/or extension of the low back. Pain is worse with prolonged standing and walker;  better with sitting and lying down.    She has moderate weakness in her hip abductors and extensors, but pretty good flexibility overall.  Patient has deficits in lumbar ROM,  hip and back strength/stability, abnormal posture, and low back/BLE pain which are interfering with ADLs and are impacting quality of life.  On Modified Oswestry patient scored 16/50 demonstrating 32% or moderate disability.  Kaitlin Carpenter will benefit from skilled PT to address above deficits to improve mobility and activity tolerance with decreased pain interference.     OBJECTIVE IMPAIRMENTS: difficulty walking, decreased ROM, decreased strength, postural dysfunction, and pain.   ACTIVITY LIMITATIONS: carrying, lifting, bending, standing, squatting, sleeping, and locomotion level  PARTICIPATION  LIMITATIONS: meal prep, cleaning, laundry, shopping, and community activity  PERSONAL FACTORS: Fitness, Time since onset of injury/illness/exacerbation, and 1-2 comorbidities: HTN, alcoholic pancreatitis, chronic hepatitis, psychophysiological insomnia are also affecting patient's functional outcome.   REHAB POTENTIAL: Good  CLINICAL DECISION MAKING: Evolving/moderate complexity  EVALUATION COMPLEXITY: Moderate   GOALS: Goals reviewed with patient? Yes  SHORT TERM GOALS: Target date: 08/27/2024   Patient will be independent with initial HEP to improve outcomes and carryover.  Baseline: 100% PT assist required for correct completion 08/13/24: independent Goal status: MET  2.  Patient will report 25% improvement in low back pain to improve QOL. Baseline: 10/10 worst Goal status: IN PROGRESS  3.  Patient will improve lumbar ROM to 75% of normal in all planes Baseline: see ROM tables above Goal status: INITIAL   LONG TERM GOALS: Target date: 09/24/2024   Patient will be independent with ongoing/advanced HEP for self-management at home.  Baseline: no advanced HEP yet Goal status: INITIAL  2.  Patient will report 50-75% improvement in low back pain to improve QOL.  Baseline: 10/10 worst Goal status: INITIAL   4.  Patient will demonstrate full pain free lumbar ROM to perform ADLs.   Baseline: Refer to above lumbar ROM table Goal status: INITIAL  5.  Patient will demonstrate improved BLE strength to >/= 5/5 for improved stability and ease of mobility. Baseline: Refer to above LE MMT table Goal status: INITIAL  6. Patient will report </= 20% on Modified Oswestry (MCID = 12%) to demonstrate improved functional ability with decreased pain interference. Baseline: 32 Goal status: INITIAL  7.  Patient will tolerate 60 min of (standing/sitting/walking) w/o increased pain to allow for  improved mobility and activity tolerance. Baseline: limited to 20-30 minutes per patient  report Goal status: INITIAL  8.  Patient will report centralization of radicular symptoms.  Baseline: reports radiation into BLE to distal thighs Goal status: INITIAL   PLAN:  PT FREQUENCY: 1-2x/week  PT DURATION: 8 weeks  PLANNED INTERVENTIONS: 97164- PT Re-evaluation, 97110-Therapeutic exercises, 97530- Therapeutic activity, [id number]- Neuromuscular re-education, 97535- Self Care, [id number]- Manual therapy, [id number]- Electrical stimulation (unattended), 20560 (1-2 muscles), 20561 (3+ muscles)- Dry Needling,  Patient/Family education, Taping, Joint mobilization, Spinal mobilization, Cryotherapy, and Moist heat  PLAN FOR NEXT SESSION: weighted sit to stands, carrying weights   Loralei Radcliffe, PT 08/13/2024, 9:09 PM

## 2024-08-14 ENCOUNTER — Telehealth: Payer: Self-pay

## 2024-08-14 NOTE — Telephone Encounter (Signed)
 No contact was able to be made as all numbers for pt. On file was either a work number or the emergency contact was out of service. Created a letter with information and mailed results to pt [place] home address on file.

## 2024-08-14 NOTE — Telephone Encounter (Signed)
 Aletha Bene, MD  Kayla Jeoffrey RAMAN, FNP; Corinna Jesusa SAUNDERS, CMA; Angelena Ronal Slater MARLA, LPN See below-can you try to contact patient re her biopsy results. She will need follow up in 6 mos Thanks. Bene Aletha       Previous Messages    ----- Message ----- From: Hollie Rock BROCKS, RN Sent: 07/17/2024   9:42 AM EST To: Jeoffrey RAMAN Kayla, FNP Subject: RICK re breast bx results                      Hi Amber,  I am unable to give your pt her biopsy results as her only number is a work number (they refuse to let me speak w/ pt unless I tell them the nature of my call; which I cannot do based on privacy laws). Plz ask your staff to contact pt w/ results & recommendation.  Recommendation: pt to return for 6 mo follow-up mammogram to assess radial scar and additional calcifications in RIGHT breast.  Dx: 1. Breast, left, needle core biopsy, UOQ- BENIGN BREAST TISSUE WITH DENSE FIBROSIS, FOCAL ADENOSIS AND CALCIFICATIONS._ NEGATIVE FOR ATYPIA OR MALIGNANCY. 2. Breast, right, needle core biopsy, UOQ- FOCAL RADIAL SCAR WITH CALCIFICATIONS. - DENSE STROMAL FIBROSIS. - NEGATIVE FOR ATYPIA OR MALIGNANCY.    CESAR Rock Hollie RN DRI

## 2024-08-17 ENCOUNTER — Ambulatory Visit: Payer: MEDICAID

## 2024-08-20 ENCOUNTER — Encounter: Payer: Self-pay | Admitting: Rehabilitation

## 2024-08-20 ENCOUNTER — Ambulatory Visit: Payer: MEDICAID | Admitting: Rehabilitation

## 2024-08-20 DIAGNOSIS — M6281 Muscle weakness (generalized): Secondary | ICD-10-CM

## 2024-08-20 DIAGNOSIS — M5459 Other low back pain: Secondary | ICD-10-CM | POA: Diagnosis not present

## 2024-08-20 NOTE — Therapy (Signed)
 OUTPATIENT PHYSICAL THERAPY THORACOLUMBAR TREATMENT   Patient Name: TIANNI ESCAMILLA MRN: [id number] DOB:09-15-1971, 53 y.o., female Today's Date: 08/20/2024  END OF SESSION:  PT End of Session - 08/20/24 1404     Visit Number 4    Date for Recertification  09/24/24    PT Start Time 1400    PT Stop Time 1444    PT Time Calculation (min) 44 min    Activity Tolerance Patient limited by pain    Behavior During Therapy [facility] for tasks assessed/performed           Past Medical History:  Diagnosis Date   Anxiety    Essential hypertension 01/23/2023   Hepatitis C antibody positive    Past Surgical History:  Procedure Laterality Date   BREAST BIOPSY Left 07/16/2024   MM LT BREAST BX W LOC DEV EA AD LESION IMG BX SPEC STEREO GUIDE 07/16/2024 [facility]   BREAST BIOPSY Right 07/16/2024   MM RT BREAST BX W LOC DEV 1ST LESION IMAGE BX SPEC STEREO GUIDE 07/16/2024 [facility]   NECK SURGERY     SHOULDER SURGERY Left    Patient Active Problem List   Diagnosis Date Noted   Skin lesion 10/10/2023   Chronic hepatitis (HCC) 09/03/2023   Chest pain 07/31/2023   GAD (generalized anxiety disorder) 01/23/2023   Psychophysiological insomnia 01/23/2023   Acute right-sided low back pain with right-sided sciatica 01/23/2023   Essential hypertension 01/23/2023   Marijuana abuse 01/23/2023   Acute pancreatitis 12/19/2022   Acute alcoholic pancreatitis 12/19/2022   Hyponatremia 12/19/2022   Hypokalemia 12/19/2022   Tobacco abuse 12/19/2022   Melena 12/19/2022   Alcohol abuse 10/04/2020    PCP: Kayla Jeoffrey RAMAN, FNP   REFERRING PROVIDER: Georgina Ozell LABOR, MD   REFERRING DIAG: [phone] (ICD-10-CM) - Acute bilateral low back pain with bilateral sciatica  THERAPY DIAG:  Other low back pain  Muscle weakness (generalized)  RATIONALE FOR EVALUATION AND TREATMENT: Rehabilitation  ONSET DATE: many years  NEXT MD VISIT:    SUBJECTIVE:                                                                                                                                                                                                          SUBJECTIVE STATEMENT: Patient states she feels no better with her back pain and if anything worse.  States is doing her exercises at home and feels she is getting a little stronger, but the pain is constant and doesn't let up.  Rates pain today 8/10  EVAL:  53 y/o patient referred to PT from Dr Georgina for LBP and B sciatica.   Patient reports having a long h/o chronic LBP for many years.   States she has worked a lot of physical jobs with heavy lifting requirements during her life.   States the pain has worsened over the last several months for no apparent reason.   Her Xrays show grade 1 spondylolisthesis at L4/5 and disc height loss, anterior osteophyte, and vacuum disc phenomenon at L5/S1.   Patient states the pain is worse with prolonged standing or walking and better with sitting or lying down.   She reports she gets shooting pains into posterior BLE down the buttock and thigh area.     She is currently not working and states she has tried but can't make it thru the workday due to the pain.   She resides at [facility] adult residential facility and has been there for the last 7 months.   PAIN: Are you having pain? Yes: NPRS scale: 4/10 now and average;  10/10 worst Pain location: central low back Pain description: constant ache, intermittently sharp Aggravating factors: prolonged standing;   when first sitting down Relieving factors: sitting for a while;  lying down  PERTINENT HISTORY:  HTN, alcoholic pancreatitis, chronic hepatitis, psychophysiological insomnia  PRECAUTIONS: None  RED FLAGS: None  WEIGHT BEARING RESTRICTIONS: No  FALLS:  Has patient fallen in last 6 months? No  LIVING ENVIRONMENT: Lives with: lives in adult residential facility Lives in: Group home Stairs: No Has following equipment at home:  None  OCCUPATION: unemployed  PLOF: Independent  PATIENT GOALS: feel better in my back   OBJECTIVE: (objective measures completed at initial evaluation unless otherwise dated)  DIAGNOSTIC FINDINGS:  XRs of the lumbar spine from 07/13/2024 were independently reviewed and  interpreted, showing grade 1 spondylolisthesis at L4/5.  Disc height loss  with anterior osteophyte formation at L5/S1.  Vacuum disc phenomenon at  L5/S1.  No other significant degenerative changes seen.  No fracture or  dislocation seen.    CLINICAL DATA:  Low back pain, right hip pain   EXAM: LUMBAR SPINE - COMPLETE 4+ VIEW   COMPARISON:  None.   FINDINGS: Five lumbar type vertebral bodies. No evidence of pars break. Vertebral body heights and alignment are maintained. There is disc space narrowing with endplate osteophytes at L5-S1. Lower lumbar facet hypertrophy.   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     Electronically Signed   By: Santina Blanch M.D.   On: 04/07/2021 13:12  PATIENT SURVEYS:  Modified Oswestry:  MODIFIED OSWESTRY DISABILITY SCALE  Date: 08/20/2024 Score  Pain intensity 4 =  Pain medication provides me with little relief from pain.  2. Personal care (washing, dressing, etc.) 1 =  I can take care of myself normally, but it increases my pain.  3. Lifting 2 = Pain prevents me from lifting heavy weights off the floor,but I can manage if the weights are conveniently positioned (e.g. on a table)  4. Walking 2 =  Pain prevents me from walking more than  mile.  5. Sitting 1 =  I can only sit in my favorite chair as long as I like.  6. Standing 2 =  Pain prevents me from standing more than 1 hour  7. Sleeping 1 = I can sleep well only by using pain medication.  8. Social Life 1 =  My social life is normal, but it increases my level of pain.  9. Traveling  1 =  I can travel anywhere, but it increases my pain.  10. Employment/ Homemaking 1 = My normal homemaking/job  activities increase my pain, but I can still perform all that is required of me  Total 16/50 = 32%   Interpretation of scores: Score Category Description  0-20% Minimal Disability The patient can cope with most living activities. Usually no treatment is indicated apart from advice on lifting, sitting and exercise  21-40% Moderate Disability The patient experiences more pain and difficulty with sitting, lifting and standing. Travel and social life are more difficult and they may be disabled from work. Personal care, sexual activity and sleeping are not grossly affected, and the patient can usually be managed by conservative means  41-60% Severe Disability Pain remains the main problem in this group, but activities of daily living are affected. These patients require a detailed investigation  61-80% Crippled Back pain impinges on all aspects of the patient's life. Positive intervention is required  81-100% Bed-bound These patients are either bed-bound or exaggerating their symptoms  Bluford FORBES Zoe DELENA Karon DELENA, et al. Surgery versus conservative management of stable thoracolumbar fracture: the PRESTO feasibility RCT. [place] ([place]): [employer]; 2021 Nov. [facility] Technology Assessment, No. 25.62.) Appendix 3, Oswestry Disability Index category descriptors. Available from: Findjewelers.cz  Minimally Clinically Important Difference (MCID) = 12.8%  SCREENING FOR RED FLAGS: Bowel or bladder incontinence: No Spinal tumors: No Cauda equina syndrome: No Compression fracture: No Abdominal aneurysm: No  COGNITION:  Overall cognitive status: Within functional limits for tasks assessed    SENSATION: WFL  POSTURE:       increased lumbar lordosis;  a little sway backed;   level pelvic landmarks in standing and in supine and in prone  PALPATION: TTP over lumbar spine generally;  no tenderness in either hip  LUMBAR ROM:   Active  Eval  Flexion 90% to  toes  Extension 90%; end range pain  Right lateral flexion To distal thigh  Left lateral flexion To distal thigh  Right rotation 75%; pain  Left rotation 75%; pain  (Blank rows = not tested)  MUSCLE LENGTH: Hamstrings: Right SLR 80  deg; Left SLR = 90 deg Thomas test: Right WNL deg; Left WNL deg Hamstrings: not tight ITB: not tight Piriformis: good flexibility; not tight Hip flexors: not tight Quads: NT Heelcord: NT  LOWER EXTREMITY ROM:     Active  Right eval Left eval  Hip flexion    Hip extension    Hip abduction    Hip adduction    Hip internal rotation    Hip external rotation    Knee flexion    Knee extension    Ankle dorsiflexion    Ankle plantarflexion    Ankle inversion    Ankle eversion    (Blank rows = not tested)  LOWER EXTREMITY MMT:    MMT Right eval Left eval  Hip flexion 5 5  Hip extension 3+ 4-  Hip abduction 4 4-  Hip adduction    Hip internal rotation 4+ 4+  Hip external rotation 5 5  Knee flexion 5 5  Knee extension 5 5  Ankle dorsiflexion 5 5  Ankle plantarflexion    Ankle inversion    Ankle eversion     (Blank rows = not tested)  LUMBAR SPECIAL TESTS:  Straight leg raise test: Negative, Slump test: Negative, SI Compression/distraction test: Negative, FABER test: Negative, Gaenslen's test: Negative, and Thomas test: Negative   L1-5 P/A glides and rotational  mobilizations each direction feel pretty normal and maybe a little decreased  FUNCTIONAL TESTS:  N/a  GAIT: Distance walked: into clinic Assistive device utilized: None Level of assistance: Complete Independence Gait pattern: WFL;  somewhat varus knees R>L;  slight unsteadiness Comments:    TODAY'S TREATMENT:  08/20/24 THERAPEUTIC EXERCISE: To improve strength and endurance.  Demonstration, verbal and tactile cues throughout for technique. NuStep L6 x 6'  THERAPEUTIC ACTIVITIES: To improve functional performance.  Demonstration, verbal and tactile cues throughout for  technique. Standing hip ext YTB x 2/10 BLE Standing hip abd YTB x 10 BLE Seated rowing 15# x 2/10 BUE Seated Lat PD 15# x 2/10 BUE Supine 55 cm swiss ball:  Knee flexion rollouts x 20   Legs straight/ knees side to side x 20 BLE  90/90 bridging  90/90 knees side to side x 20 BLE  NEUROMUSCULAR RE-EDUCATION: To improve kinesthesia and posture. Sit to stand holding 2# DB overhead w/ TA x 2/5 MFI walkouts YTB x 6 laps each direction Palloff press RTB x 10 each direction Seated 75 cm swiss ball   Shoulder extension RTB x 2/10 BUE  Rowing RTB x 2/10 BUE  08/13/24 THERAPEUTIC EXERCISE: To improve strength and endurance.  Demonstration, verbal and tactile cues throughout for technique. NuStep L5 x 7'  THERAPEUTIC ACTIVITIES: To improve functional performance.  Demonstration, verbal and tactile cues throughout for technique. Supine 55 cm swiss ball:  Knee flexion rollouts x 20   Legs straight/ knees side to side x 20 BLE  90/90 bridging  90/90 knees side to side x 20 BLE Supine PPT x 20 w/ 3 sec holds   NEUROMUSCULAR RE-EDUCATION: To improve coordination, kinesthesia, posture, and proprioception. Seated on table 65 swiss ball rollouts F/S/S x 20 each Seated palloff press  Seated on 75 cm swiss ball:  Bouncing EO x 1', EC x 1'  Marching x 20 BLE  LAQ x 1' BLE  Alternate UE/LE raises x 1' BUE/BLE Side planks from knees x 5 each direction  08/10/24 Nustep L5x38min Prone foam roll to low back for IASTM Prone knee flexion x 10 B Supine SKTC BLE x 1 min Supine figure 4 stretch with B hip flexion x 1 min Curl ups  S/L hip abduction  Prone hip ext Karolynn pose x 1 min 3 ways Seated ab sets orange pball 10x3'  07/30/24 SELF CARE: Provided education on PT POC progression.and initial HEP;   avoiding sit ups and painful yoga positions with extension and/or rotation.   Doing ab curl ups only for core strength for now    PATIENT EDUCATION:  Education details: HEP review and HEP  update - medbridge updates  Person educated: Patient Education method: Explanation, Demonstration, Verbal cues, Tactile cues, Handouts, and MedBridgeGO app access provided Education comprehension: verbalized understanding, verbal cues required, tactile cues required, and needs further education  HOME EXERCISE PROGRAM: Access Code: [email or web address] URL: [email or web address] Date: 08/13/2024 Prepared by: Garnette Montclair  Exercises - Supine Bridge  - 1 x daily - 7 x weekly - 3 sets - 10 reps - Prone Hip Extension  - 1 x daily - 7 x weekly - 3 sets - 10 reps - Hooklying Single Knee to Chest Stretch  - 1 x daily - 7 x weekly - 1 sets - 10 reps - 5 sec hold - Supine Double Knee to Chest  - 1 x daily - 7 x weekly - 1 sets - 10 reps - 5 sec hold - Curl Up with Reach  -  1 x daily - 7 x weekly - 2 sets - 10 reps - Diagonal Curl Up with Reach  - 1 x daily - 7 x weekly - 2 sets - 10 reps - Side Plank on Knees  - 1 x daily - 7 x weekly - 3 sets - 10 reps - Supine Posterior Pelvic Tilt  - 1 x daily - 7 x weekly - 3 sets - 10 reps - Sidelying Fire Hydrant  - 1 x daily - 7 x weekly - 3 sets - 10 reps  ASSESSMENT:  CLINICAL IMPRESSION: Patient is able to complete all exercises, but most everything is painful.  She is limited mainly by the pain with all activity.   She is advised to contact her MD and see if she can get back in to see them in the near future.  Pain is restricting her progress with PT   DAWNE CASALI is a 53 y.o. female who was referred to physical therapy for evaluation and treatment for acute LBP w/ B sciatica.     Patient reports onset of LBP/sciatica pain beginning many years ago.    Pain has recently worsened over the last few months for no apparent reason.   She does do yoga and sit ups at home and admits that these exercises can be painful and is advised to avoid sit ups and yoga movements involving rotation and/or extension of the low back. Pain is worse with prolonged  standing and walker;  better with sitting and lying down.    She has moderate weakness in her hip abductors and extensors, but pretty good flexibility overall.  Patient has deficits in lumbar ROM,  hip and back strength/stability, abnormal posture, and low back/BLE pain which are interfering with ADLs and are impacting quality of life.  On Modified Oswestry patient scored 16/50 demonstrating 32% or moderate disability.  Kiki will benefit from skilled PT to address above deficits to improve mobility and activity tolerance with decreased pain interference.     OBJECTIVE IMPAIRMENTS: difficulty walking, decreased ROM, decreased strength, postural dysfunction, and pain.   ACTIVITY LIMITATIONS: carrying, lifting, bending, standing, squatting, sleeping, and locomotion level  PARTICIPATION LIMITATIONS: meal prep, cleaning, laundry, shopping, and community activity  PERSONAL FACTORS: Fitness, Time since onset of injury/illness/exacerbation, and 1-2 comorbidities: HTN, alcoholic pancreatitis, chronic hepatitis, psychophysiological insomnia are also affecting patient's functional outcome.   REHAB POTENTIAL: Good  CLINICAL DECISION MAKING: Evolving/moderate complexity  EVALUATION COMPLEXITY: Moderate   GOALS: Goals reviewed with patient? Yes  SHORT TERM GOALS: Target date: 08/27/2024   Patient will be independent with initial HEP to improve outcomes and carryover.  Baseline: 100% PT assist required for correct completion 08/13/24: independent Goal status: MET  2.  Patient will report 25% improvement in low back pain to improve QOL. Baseline: 10/10 worst Goal status: IN PROGRESS  3.  Patient will improve lumbar ROM to 75% of normal in all planes Baseline: see ROM tables above Goal status: INITIAL   LONG TERM GOALS: Target date: 09/24/2024   Patient will be independent with ongoing/advanced HEP for self-management at home.  Baseline: no advanced HEP yet 11/20:  advancing Goal status:  IN PROGRESS  2.  Patient will report 50-75% improvement in low back pain to improve QOL.  Baseline: 10/10 worst Goal status: INITIAL   4.  Patient will demonstrate full pain free lumbar ROM to perform ADLs.   Baseline: Refer to above lumbar ROM table Goal status: INITIAL  5.  Patient will demonstrate  improved BLE strength to >/= 5/5 for improved stability and ease of mobility. Baseline: Refer to above LE MMT table Goal status: INITIAL  6. Patient will report </= 20% on Modified Oswestry (MCID = 12%) to demonstrate improved functional ability with decreased pain interference. Baseline: 32 Goal status: INITIAL  7.  Patient will tolerate 60 min of (standing/sitting/walking) w/o increased pain to allow for  improved mobility and activity tolerance. Baseline: limited to 20-30 minutes per patient report Goal status: INITIAL  8.  Patient will report centralization of radicular symptoms.  Baseline: reports radiation into BLE to distal thighs 08/20/24:  still feeling radiation into the back of her legs Goal status: IN PROGRESS   PLAN:  PT FREQUENCY: 1-2x/week  PT DURATION: 8 weeks  PLANNED INTERVENTIONS: 97164- PT Re-evaluation, 97110-Therapeutic exercises, 97530- Therapeutic activity, 97112- Neuromuscular re-education, 97535- Self Care, 02859- Manual therapy, [id number]- Electrical stimulation (unattended), 20560 (1-2 muscles), 20561 (3+ muscles)- Dry Needling, Patient/Family education, Taping, Joint mobilization, Spinal mobilization, Cryotherapy, and Moist heat  PLAN FOR NEXT SESSION: Check lumbar ROM, strength, and ODI.  Continue with lumbar/core strength and stability exercises   Ademola Vert, PT 08/20/2024, 7:31 PM

## 2024-08-31 ENCOUNTER — Encounter: Payer: Self-pay | Admitting: Physician Assistant

## 2024-08-31 ENCOUNTER — Ambulatory Visit: Payer: MEDICAID | Admitting: Physician Assistant

## 2024-08-31 VITALS — BP 93/66 | HR 81

## 2024-08-31 DIAGNOSIS — F1010 Alcohol abuse, uncomplicated: Secondary | ICD-10-CM

## 2024-08-31 DIAGNOSIS — M5441 Lumbago with sciatica, right side: Secondary | ICD-10-CM

## 2024-08-31 DIAGNOSIS — G8929 Other chronic pain: Secondary | ICD-10-CM

## 2024-08-31 DIAGNOSIS — K739 Chronic hepatitis, unspecified: Secondary | ICD-10-CM

## 2024-08-31 DIAGNOSIS — R197 Diarrhea, unspecified: Secondary | ICD-10-CM

## 2024-08-31 MED ORDER — KETOROLAC TROMETHAMINE 60 MG/2ML IM SOLN
60.0000 mg | Freq: Once | INTRAMUSCULAR | Status: AC
  Administered 2024-08-31: 60 mg via INTRAMUSCULAR

## 2024-08-31 MED ORDER — METHYLPREDNISOLONE ACETATE 80 MG/ML IJ SUSP
80.0000 mg | Freq: Once | INTRAMUSCULAR | Status: AC
  Administered 2024-08-31: 80 mg via INTRAMUSCULAR

## 2024-08-31 MED ORDER — CYCLOBENZAPRINE HCL 10 MG PO TABS: 10.0000 mg | ORAL_TABLET | Freq: Three times a day (TID) | ORAL | 0 refills | Status: DC | PRN

## 2024-08-31 NOTE — Progress Notes (Unsigned)
 Established Patient Office Visit  Subjective   Patient ID: Kaitlin Carpenter, female    DOB: 01-May-1971  Age: 53 y.o. MRN: [id number]  Chief Complaint  Patient presents with   Back Pain   Abdominal Pain    She is having diarrhea,and bad gas  Discussed the use of AI scribe software for clinical note transcription with the patient, who gave verbal consent to proceed.  History of Present Illness   History of Present Illness Kaitlin Carpenter is a 53 year old female who presents with persistent back pain and new onset diarrhea.  She has persistent back pain despite completing physical therapy. Core strength improved but pain is unchanged. She uses a heating pad and takes a muscle relaxer and gabapentin . Transportation to physical therapy is difficult.  For the past 2.5 weeks she has had increased gas and diarrhea, bad enough that she needed to stay near a bathroom. She has not had a normal bowel movement in the last month. Her Buspar dose was recently increased. She has no recent antibiotic use. She drinks little water and continues to eat dairy, which worsens diarrhea, especially in the morning. She previously used fiber for constipation.  She is worried about her mother's pancreatic cancer and the possibility of pancreatitis. She is also concerned about her hepatitis care because she has been unable to get a follow up appointment despite repeated attempts.  Physical Exam GENERAL: Alert, cooperative, well developed, no acute distress HEENT: Normocephalic, normal oropharynx, moist mucous membranes CHEST: Clear to auscultation bilaterally, no wheezes, rhonchi, or crackles CARDIOVASCULAR: Normal heart rate and rhythm, S1 and S2 normal without murmurs ABDOMEN: Soft, non-tender, non-distended, without organomegaly, normal bowel sounds EXTREMITIES: No cyanosis or edema NEUROLOGICAL: Cranial nerves grossly intact, moves all extremities without gross motor or sensory deficit  Assessment and  Plan Lumbago with right-sided sciatica Chronic back pain with right-sided sciatica. Physical therapy improved core strength but not pain relief. Current pain management includes heating pad, muscle relaxers, and gabapentin . She requested a steroid injection for pain relief. - Encouraged continuation of physical therapy. - Administered steroid injection for pain relief. - Continue current pain management regimen with heating pad, muscle relaxers, and gabapentin .  Diarrhea and food intolerance Diarrhea and malodorous flatulence for two weeks, with recent improvement. Possible food intolerance, particularly to dairy, suspected. The patient does not experience severe abdominal pain or vomiting, which are typical of pancreatitis or gallbladder issues. - Advised avoiding dairy products for two weeks. - Encouraged increased water intake. - Recommended fiber supplementation. - Consider probiotics if symptoms persist after dietary modifications. - Scheduled follow-up in two weeks.  Chronic hepatitis Referral to infectious disease specialist for management. She reports difficulty in scheduling an appointment. - Provided phone number for infectious disease specialist. - Ensured referral remains active.  HPI  Past Medical History:  Diagnosis Date   Anxiety    Essential hypertension 01/23/2023   Hepatitis C antibody positive    Social History   Socioeconomic History   Marital status: Divorced    Spouse name: Not on file   Number of children: Not on file   Years of education: Not on file   Highest education level: Not on file  Occupational History   Not on file  Tobacco Use   Smoking status: Former    Current packs/day: 1.00    Types: Cigarettes   Smokeless tobacco: Never  Vaping Use   Vaping status: Never Used  Substance and Sexual Activity   Alcohol use: Not  Currently    Alcohol/week: 12.0 standard drinks of alcohol    Types: 12 Cans of beer per week    Comment: Patient has been  clean for 140 days on 08.04.2025   Drug use: Never   Sexual activity: Not Currently  Other Topics Concern   Not on file  Social History Narrative   Not on file   Social Drivers of Health   Financial Resource Strain: Not on file  Food Insecurity: No Food Insecurity (01/02/2023)   Hunger Vital Sign    Worried About Running Out of Food in the Last Year: Never true    Ran Out of Food in the Last Year: Never true  Transportation Needs: Unmet Transportation Needs (01/02/2023)   PRAPARE - Administrator, Civil Service (Medical): Yes    Lack of Transportation (Non-Medical): Yes  Physical Activity: Not on file  Stress: Not on file  Social Connections: Not on file  Intimate Partner Violence: Not At Risk (01/02/2023)   Humiliation, Afraid, Rape, and Kick questionnaire    Fear of Current or Ex-Partner: No    Emotionally Abused: No    Physically Abused: No    Sexually Abused: No   Family History  Family history unknown: Yes   No Known Allergies  ROS    Objective:     BP 93/66 (BP Location: Left Arm, Patient Position: Sitting, Cuff Size: Normal)   Pulse 81   LMP 01/05/2021 (Within Days) Comment: Pt has not been active in a year and pt states she's not getting periods anymore.  SpO2 98%  BP Readings from Last 3 Encounters:  08/31/24 93/66  07/13/24 105/70  06/25/24 123/81   Wt Readings from Last 3 Encounters:  07/13/24 116 lb (52.6 kg)  06/25/24 116 lb (52.6 kg)  06/08/24 119 lb (54 kg)    Physical Exam     Assessment & Plan:   Problem List Items Addressed This Visit   None   No AVS created, patient does not have access to MyChart at this time, no printer capability on screening van.  Patient education given through teach back method.   No follow-ups on file.    Kirk RAMAN Mayers, PA-C

## 2024-09-07 ENCOUNTER — Ambulatory Visit: Payer: MEDICAID | Admitting: Orthopedic Surgery

## 2024-09-07 DIAGNOSIS — M5416 Radiculopathy, lumbar region: Secondary | ICD-10-CM

## 2024-09-07 NOTE — Progress Notes (Signed)
 Orthopedic Spine Surgery Office Note   Assessment: Patient is a 53 y.o. female with low back pain that radiates into the bilateral anterolateral aspect of the thighs and legs, suspect radiculopathy     Plan: -Patient has tried Tylenol , intramuscular steroid injection, gabapentin  -Since patient has now had symptoms for over 6 weeks and spite of conservative treatments, recommended an MRI of the lumbar spine to evaluate for radiculopathy -Patient should return to office in 4-5 weeks, x-rays at next visit: none     Patient expressed understanding of the plan and all questions were answered to the patient's satisfaction.    ___________________________________________________________________________     History:   Patient is a 53 y.o. female who presents today for follow up on her lumbar spine.  Patient continues to have low back pain that radiates into the bilateral lower extremities.  She feels it going into the anterolateral aspect of his thighs and legs.  She has not noticed any changes in her symptoms since she was last seen in the office.  Since her last visit, she did increase her gabapentin  dose but did not notice any improvement in her symptoms.   Treatments tried: PT, intramuscular steroid reduction, gabapentin      Physical Exam:   General: no acute distress, appears stated age Neurologic: alert, answering questions appropriately, following commands Respiratory: unlabored breathing on room air, symmetric chest rise Psychiatric: appropriate affect, normal cadence to speech     MSK (spine):   -Strength exam                                                   Left                  Right EHL                              5/5                  5/5 TA                                 5/5                  5/5 GSC                             5/5                  5/5 Knee extension            5/5                  5/5 Hip flexion                    5/5                  5/5   -Sensory  exam                           Sensation intact to light touch in L3-S1 nerve distributions of bilateral lower extremities    Imaging: XRs of the lumbar spine from 07/13/2024 were  previously independently reviewed and interpreted, showing grade 1 spondylolisthesis at L4/5.  Disc height loss with anterior osteophyte formation at L5/S1.  Vacuum disc phenomenon at L5/S1.  No other significant degenerative changes seen.  No fracture or dislocation seen.     Patient name: Kaitlin Carpenter Patient MRN: [id number] Date of visit: 09/07/24

## 2024-09-14 ENCOUNTER — Encounter: Payer: Self-pay | Admitting: Physician Assistant

## 2024-09-14 ENCOUNTER — Ambulatory Visit: Payer: MEDICAID | Admitting: Physician Assistant

## 2024-09-14 VITALS — BP 135/87 | HR 67 | Ht 63.0 in | Wt 117.0 lb

## 2024-09-14 DIAGNOSIS — G8929 Other chronic pain: Secondary | ICD-10-CM

## 2024-09-14 DIAGNOSIS — R143 Flatulence: Secondary | ICD-10-CM

## 2024-09-14 DIAGNOSIS — R1013 Epigastric pain: Secondary | ICD-10-CM

## 2024-09-14 DIAGNOSIS — F1010 Alcohol abuse, uncomplicated: Secondary | ICD-10-CM

## 2024-09-14 MED ORDER — PROBIOTIC ACIDOPHILUS PO CAPS: 1.0000 | ORAL_CAPSULE | Freq: Every day | ORAL | 1 refills | Status: DC

## 2024-09-14 MED ORDER — METHYLPREDNISOLONE ACETATE 80 MG/ML IJ SUSP
80.0000 mg | Freq: Once | INTRAMUSCULAR | Status: AC
  Administered 2024-09-14: 12:00:00 80 mg via INTRAMUSCULAR
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
## 2024-09-14 NOTE — Patient Instructions (Signed)
 VISIT SUMMARY:  During your visit, we discussed your gastrointestinal issues and back pain. You reported increased gas and varying bowel movements, and we talked about your chronic back pain and the ongoing evaluation by orthopedics.  YOUR PLAN:  -FUNCTIONAL DIARRHEA WITH FLATULENCE: Functional diarrhea means you have frequent, loose bowel movements without a clear medical cause. We discussed that your low water intake might be contributing to this issue. To help manage your symptoms, you should increase your water intake, start taking probiotics, and use fiber supplements to help bulk up your stool.  -CHRONIC RIGHT-SIDED LOW BACK PAIN WITH SCIATICA: Chronic low back pain with sciatica means you have ongoing pain in your lower back that radiates down your leg, likely due to nerve involvement. You have an upcoming orthopedic follow-up on January 22nd for further evaluation and a possible MRI to better understand your condition.

## 2024-09-15 ENCOUNTER — Encounter: Payer: Self-pay | Admitting: Physician Assistant

## 2024-09-29 ENCOUNTER — Ambulatory Visit (HOSPITAL_BASED_OUTPATIENT_CLINIC_OR_DEPARTMENT_OTHER)
Admission: RE | Admit: 2024-09-29 | Discharge: 2024-09-29 | Disposition: A | Discharge status: Home / Self Care | Payer: MEDICAID | Source: Ambulatory Visit | Attending: Orthopedic Surgery | Admitting: Orthopedic Surgery

## 2024-09-29 DIAGNOSIS — M5416 Radiculopathy, lumbar region: Secondary | ICD-10-CM | POA: Insufficient documentation

## 2024-10-05 ENCOUNTER — Ambulatory Visit: Payer: MEDICAID | Admitting: Physician Assistant

## 2024-10-05 ENCOUNTER — Encounter: Payer: Self-pay | Admitting: Physician Assistant

## 2024-10-05 VITALS — BP 104/81 | HR 65 | Ht 63.0 in | Wt 118.6 lb

## 2024-10-05 DIAGNOSIS — M5441 Lumbago with sciatica, right side: Secondary | ICD-10-CM | POA: Diagnosis not present

## 2024-10-05 DIAGNOSIS — F1010 Alcohol abuse, uncomplicated: Secondary | ICD-10-CM | POA: Diagnosis not present

## 2024-10-05 DIAGNOSIS — G8929 Other chronic pain: Secondary | ICD-10-CM | POA: Diagnosis not present

## 2024-10-05 MED ORDER — KETOROLAC TROMETHAMINE 60 MG/2ML IM SOLN
60.0000 mg | Freq: Once | INTRAMUSCULAR | Status: AC
  Administered 2024-10-05: 60 mg via INTRAMUSCULAR

## 2024-10-05 MED ORDER — METHYLPREDNISOLONE ACETATE 80 MG/ML IJ SUSP
80.0000 mg | Freq: Once | INTRAMUSCULAR | Status: AC
  Administered 2024-10-05: 80 mg via INTRAMUSCULAR
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
## 2024-10-05 NOTE — Patient Instructions (Signed)
 VISIT SUMMARY:  You were seen today for persistent back pain and ongoing alcohol-related gastrointestinal symptoms. We discussed your current medications and administered a steroid injection for your back pain.  YOUR PLAN:  -CHRONIC RIGHT-SIDED LOW BACK PAIN WITH RIGHT-SIDED SCIATICA: This condition involves persistent pain in your lower back that radiates down your right leg, often accompanied by numbness and tingling. We administered a steroid injection to help manage your pain and advised you to continue taking your muscle relaxer and gabapentin .

## 2024-10-07 ENCOUNTER — Other Ambulatory Visit: Payer: Self-pay

## 2024-10-07 ENCOUNTER — Encounter: Payer: Self-pay | Admitting: Internal Medicine

## 2024-10-07 ENCOUNTER — Ambulatory Visit: Payer: MEDICAID | Admitting: Internal Medicine

## 2024-10-07 VITALS — BP 108/76 | HR 57 | Temp 97.7°F | Ht 63.0 in | Wt 118.0 lb

## 2024-10-07 DIAGNOSIS — B182 Chronic viral hepatitis C: Secondary | ICD-10-CM
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
## 2024-10-09 LAB — COMPLETE METABOLIC PANEL WITHOUT GFR
AG Ratio: 1.5 (calc) (ref 1.0–2.5)
ALT: 25 U/L (ref 6–29)
AST: 24 U/L (ref 10–35)
Albumin: 4.6 g/dL (ref 3.6–5.1)
Alkaline phosphatase (APISO): 57 U/L (ref 37–153)
BUN: 11 mg/dL (ref 7–25)
CO2: 30 mmol/L (ref 20–32)
Calcium: 9.8 mg/dL (ref 8.6–10.4)
Chloride: 100 mmol/L (ref 98–110)
Creat: 0.79 mg/dL (ref 0.50–1.03)
Globulin: 3.1 g/dL (ref 1.9–3.7)
Glucose, Bld: 86 mg/dL (ref 65–99)
Potassium: 4.6 mmol/L (ref 3.5–5.3)
Sodium: 137 mmol/L (ref 135–146)
Total Bilirubin: 0.3 mg/dL (ref 0.2–1.2)
Total Protein: 7.7 g/dL (ref 6.1–8.1)

## 2024-10-09 LAB — HEPATITIS C RNA QUANTITATIVE
HCV Quantitative Log: 6.54 {Log_IU}/mL — ABNORMAL HIGH
HCV RNA, PCR, QN: 3440000 [IU]/mL — ABNORMAL HIGH

## 2024-10-09 LAB — CBC WITH DIFFERENTIAL/PLATELET
Absolute Lymphocytes: 1959 {cells}/uL (ref 850–3900)
Absolute Monocytes: 491 {cells}/uL (ref 200–950)
Basophils Absolute: 32 {cells}/uL (ref 0–200)
Basophils Relative: 0.5 %
Eosinophils Absolute: 69 {cells}/uL (ref 15–500)
Eosinophils Relative: 1.1 %
HCT: 38.2 % (ref 35.9–46.0)
Hemoglobin: 12.8 g/dL (ref 11.7–15.5)
MCH: 33.2 pg — ABNORMAL HIGH (ref 27.0–33.0)
MCHC: 33.5 g/dL (ref 31.6–35.4)
MCV: 99 fL (ref 81.4–101.7)
MPV: 10.9 fL (ref 7.5–12.5)
Monocytes Relative: 7.8 %
Neutro Abs: 3749 {cells}/uL (ref 1500–7800)
Neutrophils Relative %: 59.5 %
Platelets: 262 Thousand/uL (ref 140–400)
RBC: 3.86 Million/uL (ref 3.80–5.10)
RDW: 12.5 % (ref 11.0–15.0)
Total Lymphocyte: 31.1 %
WBC: 6.3 Thousand/uL (ref 3.8–10.8)

## 2024-10-14 ENCOUNTER — Ambulatory Visit (HOSPITAL_COMMUNITY)
Admission: RE | Admit: 2024-10-14 | Discharge: 2024-10-14 | Disposition: A | Discharge status: Home / Self Care | Payer: MEDICAID | Source: Ambulatory Visit | Attending: Internal Medicine | Admitting: Internal Medicine

## 2024-10-14 DIAGNOSIS — B182 Chronic viral hepatitis C: Secondary | ICD-10-CM | POA: Insufficient documentation

## 2024-10-19 ENCOUNTER — Ambulatory Visit: Payer: Self-pay | Admitting: Internal Medicine

## 2024-10-19 ENCOUNTER — Other Ambulatory Visit (HOSPITAL_COMMUNITY): Payer: Self-pay

## 2024-10-19 ENCOUNTER — Other Ambulatory Visit: Payer: Self-pay

## 2024-10-19 ENCOUNTER — Encounter: Payer: Self-pay | Admitting: Physician Assistant

## 2024-10-19 ENCOUNTER — Ambulatory Visit: Payer: MEDICAID | Admitting: Physician Assistant

## 2024-10-19 ENCOUNTER — Other Ambulatory Visit: Payer: Self-pay | Admitting: Pharmacist

## 2024-10-19 VITALS — BP 113/74 | HR 82 | Ht 63.0 in | Wt 116.0 lb

## 2024-10-19 DIAGNOSIS — B182 Chronic viral hepatitis C: Secondary | ICD-10-CM

## 2024-10-19 DIAGNOSIS — G8929 Other chronic pain: Secondary | ICD-10-CM | POA: Diagnosis not present

## 2024-10-19 DIAGNOSIS — R143 Flatulence: Secondary | ICD-10-CM | POA: Diagnosis not present

## 2024-10-19 DIAGNOSIS — F1010 Alcohol abuse, uncomplicated: Secondary | ICD-10-CM

## 2024-10-19 DIAGNOSIS — M5441 Lumbago with sciatica, right side: Secondary | ICD-10-CM

## 2024-10-19 DIAGNOSIS — R1013 Epigastric pain: Secondary | ICD-10-CM | POA: Diagnosis not present

## 2024-10-19 MED ORDER — PROBIOTIC ACIDOPHILUS PO CAPS: 1.0000 | ORAL_CAPSULE | Freq: Every day | ORAL | 1 refills | Status: AC

## 2024-10-19 MED ORDER — KETOROLAC TROMETHAMINE 60 MG/2ML IM SOLN
60.0000 mg | Freq: Once | INTRAMUSCULAR | Status: AC
  Administered 2024-10-19: 60 mg via INTRAMUSCULAR

## 2024-10-19 MED ORDER — MAVYRET 100-40 MG PO TABS
3.0000 | ORAL_TABLET | Freq: Every day | ORAL | 1 refills | Status: AC
  Filled 2024-10-21: qty 84, 28d supply, fill #0

## 2024-10-19 MED ORDER — METHYLPREDNISOLONE ACETATE 80 MG/ML IJ SUSP
80.0000 mg | Freq: Once | INTRAMUSCULAR | Status: AC
  Administered 2024-10-19: 80 mg via INTRAMUSCULAR

## 2024-10-19 MED ORDER — OMEPRAZOLE 20 MG PO CPDR: 20.0000 mg | DELAYED_RELEASE_CAPSULE | Freq: Every day | ORAL | 3 refills | Status: AC

## 2024-10-19 MED ORDER — CYCLOBENZAPRINE HCL 10 MG PO TABS: 10.0000 mg | ORAL_TABLET | Freq: Three times a day (TID) | ORAL | 0 refills | Status: AC | PRN

## 2024-10-19 NOTE — Progress Notes (Signed)
 Script sent to [facility] at [place]. Thanks Arland!

## 2024-10-19 NOTE — Patient Instructions (Signed)
 VISIT SUMMARY:  During your visit, we discussed your ongoing gastrointestinal symptoms and chronic low back pain. We reviewed your recent abdominal ultrasound results, which were normal, and discussed your history of elevated triglycerides and alcohol cessation. We also addressed your concerns about stool shape and potential blockages.  YOUR PLAN:  -CHRONIC RIGHT-SIDED LOW BACK PAIN WITH SCIATICA: This condition involves severe wear and tear of the lower spine, causing pain that can radiate down the leg. We have scheduled a steroid injection for today to help reduce inflammation and pain. Continue taking Flexeril  at night to help with muscle relaxation. Please follow up with the orthopedic specialist on January 22nd.  -CHRONIC GASTROINTESTINAL SYMPTOMS (FLATULENCE, DYSPEPSIA, AND SUSPECTED IRRITABLE BOWEL SYNDROME): These symptoms include gas, occasional diarrhea, and concerns about stool shape. Your recent imaging showed no abdominal abnormalities, and your risk of pancreatitis is low due to stopping alcohol use. These symptoms are likely related to dietary factors and irritable bowel syndrome (IBS), which is a common disorder affecting the large intestine. Continue taking your probiotic daily and start taking omeprazole  to manage stomach acid.  We will follow up in two weeks to assess your response to omeprazole  and check your cholesterol levels at your next visit with fasting.

## 2024-10-19 NOTE — Progress Notes (Unsigned)
" ° °  Established Patient Office Visit  Subjective   Patient ID: Kaitlin Carpenter, female    DOB: [birth year]  Age: 54 y.o. MRN: [id number]  Chief Complaint  Patient presents with   Pancreatitis    She has concerns r/t pancreatitis. She is having digestive issues and is concerned there is something wrong with her gut  Discussed the use of AI scribe software for clinical note transcription with the patient, who gave verbal consent to proceed.  History of Present Illness  History of Present Illness Kaitlin Carpenter is a 54 year old female with a history of pancreatitis who presents with concerns about gastrointestinal symptoms.  She reports significant gas and occasional diarrhea that she finds debilitating and that prevents her from working. She notes stools are shaped like a candy cane and is worried about a blockage. She has tried dietary elimination without relief.  She is taking a probiotic, which has modestly improved her gas. She takes Flexeril  at night.  She recently had an abdominal ultrasound and was told the results were normal.  She had elevated triglycerides about a year ago, attributed to alcohol use. Her cholesterol was normal in November 2024 and she has since stopped drinking alcohol.  Physical Exam GENERAL: Alert, cooperative, well developed, no acute distress HEENT: Normocephalic, normal oropharynx, moist mucous membranes CHEST: Clear to auscultation bilaterally, no wheezes, rhonchi, or crackles CARDIOVASCULAR: Normal heart rate and rhythm, S1 and S2 normal without murmurs ABDOMEN: Soft, non-tender, non-distended, without organomegaly, normal bowel sounds EXTREMITIES: No cyanosis or edema NEUROLOGICAL: Cranial nerves grossly intact, moves all extremities without gross motor or sensory deficit  Results Labs Chol (08/2023): Within normal limits Triglycerides (08/2023): Elevated  Radiology Abdominal ultrasound: Normal gallbladder without cholelithiasis or wall  thickening, normal hepatic parenchyma, unremarkable pancreas, spleen normal in size, kidneys normal, no abnormal intra-abdominal findings Lumbar spine MRI (09/29/2024): Degenerative disc disease with mild disc bulge, severe degenerative disc disease at L5-S1, no significant disease  Assessment and Plan Chronic right-sided low back pain with sciatica Severe degenerative disc disease at L5-S1 with mild disc bulge causing debilitating pain. - Steroid injection scheduled today. - Continue Flexeril  at night. - Follow up with orthopedic specialist on January 22nd.  Chronic gastrointestinal symptoms (flatulence, dyspepsia, and suspected irritable bowel syndrome) Symptoms include gas, occasional diarrhea, and concern about stool shape. Recent imaging showed no abdominal abnormalities. Low pancreatitis risk due to alcohol cessation. Symptoms likely related to dietary factors and IBS. - Continue probiotic daily. - Start omeprazole  to manage stomach acid. - Implement 30-day whole foods diet to identify food intolerances. - Ensure adequate hydration. - Schedule follow-up in two weeks to assess response to omeprazole . - Check cholesterol levels at next visit with fasting.   HPI  {History (Optional):23778}  ROS    Objective:     Ht 5' 3 (1.6 m)   Wt 116 lb (52.6 kg)   LMP 01/05/2021 Comment: Pt has not been active in a year and pt states she's not getting periods anymore.  BMI 20.55 kg/m  {Vitals History (Optional):23777}  Physical Exam   No results found for any visits on 10/19/24.  {Labs (Optional):23779}  The 10-year ASCVD risk score (Arnett DK, et al., 2019) is: 1.5%    Assessment & Plan:   Problem List Items Addressed This Visit   None   No follow-ups on file.    Kirk RAMAN Mayers, PA-C  "

## 2024-10-19 NOTE — Progress Notes (Signed)
 Mavyret x 8 weeks please

## 2024-10-21 ENCOUNTER — Telehealth: Payer: Self-pay

## 2024-10-21 ENCOUNTER — Other Ambulatory Visit: Payer: Self-pay | Admitting: Pharmacist

## 2024-10-21 ENCOUNTER — Other Ambulatory Visit: Payer: Self-pay

## 2024-10-21 NOTE — Progress Notes (Signed)
 Specialty Pharmacy Initiation Note   Kaitlin Carpenter is a 54 y.o. female who will be followed by the specialty pharmacy service for RxSp Hepatitis C    Review of administration, indication, effectiveness, safety, potential side effects, storage/disposable, and missed dose instructions occurred today for patient's specialty medication(s) Glecaprevir -Pibrentasvir  (Mavyret )     Patient/Caregiver did not have any additional questions or concerns.   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 Patient will be evaluated at upcoming provider appointment to assess progress      Comply with lab assessments       Patient is on track. Patient will adhere to provider and/or lab appointments         Alan JINNY Geralds Specialty Pharmacist

## 2024-10-21 NOTE — Telephone Encounter (Signed)
 Called [facility] for logistics of getting Mavyret  to [facility] at her rehab center and to counsel her on this new medication. I spoke with an RN there who was assisting in Leann's care. She told me that the prescription must be sent to [facility] in [place] on [address] in order for the patient to be able to take it at the rehab center. I told her I understood and we would work on getting the prescription available for [place].   The RN transferred me to [place]. I counseled her on Mavyret , including dosing, side effects, monitoring, and efficacy. She expressed understanding and did not have any questions for me. I provided her with our phone number to call back with any questions. She has no personal phone, and the only way to reach her is through calling the main rehab center number [phone]) first. I transferred the patient to the pharmacy technician team to assist with medication access.   Maurilio Patten, PharmD PGY1 Pharmacy Resident [facility] 10/21/2024 3:20 PM

## 2024-10-21 NOTE — Progress Notes (Signed)
 Specialty Pharmacy Initial Fill Coordination Note  Kaitlin Carpenter is a 54 y.o. female contacted today regarding initial fill of specialty medication(s) Glecaprevir -Pibrentasvir  (Mavyret )   Patient requested Delivery   Delivery date: 10/23/24   Verified address: [name]   Medication will be filled on 10/22/24.   Patient is aware of $4.00 copayment.  Please put on AR/Acct patient do not have CC

## 2024-10-22 ENCOUNTER — Ambulatory Visit: Payer: MEDICAID | Admitting: Orthopedic Surgery

## 2024-10-22 ENCOUNTER — Other Ambulatory Visit: Payer: Self-pay

## 2024-10-22 DIAGNOSIS — M5459 Other low back pain: Secondary | ICD-10-CM | POA: Diagnosis not present

## 2024-10-22 NOTE — Progress Notes (Signed)
 Orthopedic Office Note  Patient comes in today for follow-up and review of her MRI.  She has had resolution of her leg pain.  Her only pain now is in the back.  She feels it in the lower lumbar spine.  Her pain is worse with activity and improves with rest.  She has not developed any new symptoms since she was last seen in the office.  I went over the MRI with her.  I do see facet arthropathy particularly at L4/5 but also at L5/S1.  She has a spondylolisthesis at L4/5.  There is mild bilateral foraminal stenosis at L5/S1.  Mild to moderate foraminal stenosis seen at L4/5.  On exam, she has 5 out of 5 strength in all lower extremity myotomes.  Her and sensation is intact to light touch in L3-S1 nerve distributions bilaterally.  Ambulating without assist devices.  Nonantalgic gait.  Plan: -With her pain mainly localized to the low back, I recommended trial of facet injections.  If she does well with the facet injections, could consider RFA in the future -If she does not do well with the facet injections, she should come back and we can discuss options further    MRI of the lumbar spine from 09/29/2024 was independently reviewed and interpreted, showing DDD at L4/5 and L5/S1.  Facet arthropathy at L4/5 and to a lesser extent at L5/S1.  There is a spondylolisthesis at L4/5.  Mild to moderate bilateral foraminal stenosis at L4/5.  Mild foraminal stenosis at L5/S1.

## 2024-10-23 ENCOUNTER — Other Ambulatory Visit: Payer: Self-pay

## 2024-11-24 ENCOUNTER — Ambulatory Visit: Payer: Self-pay | Admitting: Pharmacist
